# Patient Record
Sex: Female | Born: 1945
Health system: Southern US, Community
[De-identification: ages and names within clinical notes are randomized; demographics above are authoritative.]

## PROBLEM LIST (undated history)

## (undated) DIAGNOSIS — I4510 Unspecified right bundle-branch block: Secondary | ICD-10-CM

## (undated) DIAGNOSIS — J189 Pneumonia, unspecified organism: Secondary | ICD-10-CM

## (undated) DIAGNOSIS — M1990 Unspecified osteoarthritis, unspecified site: Secondary | ICD-10-CM

## (undated) DIAGNOSIS — M2041 Other hammer toe(s) (acquired), right foot: Secondary | ICD-10-CM

## (undated) DIAGNOSIS — E785 Hyperlipidemia, unspecified: Secondary | ICD-10-CM

## (undated) DIAGNOSIS — I700 Atherosclerosis of aorta: Secondary | ICD-10-CM

## (undated) DIAGNOSIS — I480 Paroxysmal atrial fibrillation: Secondary | ICD-10-CM

## (undated) DIAGNOSIS — I10 Essential (primary) hypertension: Secondary | ICD-10-CM

## (undated) DIAGNOSIS — Z860100 Personal history of colon polyps, unspecified: Secondary | ICD-10-CM

## (undated) DIAGNOSIS — E876 Hypokalemia: Secondary | ICD-10-CM

## (undated) DIAGNOSIS — R000 Tachycardia, unspecified: Secondary | ICD-10-CM

## (undated) DIAGNOSIS — I509 Heart failure, unspecified: Secondary | ICD-10-CM

## (undated) DIAGNOSIS — I5030 Unspecified diastolic (congestive) heart failure: Secondary | ICD-10-CM

## (undated) DIAGNOSIS — I4891 Unspecified atrial fibrillation: Secondary | ICD-10-CM

## (undated) DIAGNOSIS — L719 Rosacea, unspecified: Secondary | ICD-10-CM

## (undated) DIAGNOSIS — D329 Benign neoplasm of meninges, unspecified: Secondary | ICD-10-CM

## (undated) DIAGNOSIS — G459 Transient cerebral ischemic attack, unspecified: Secondary | ICD-10-CM

## (undated) DIAGNOSIS — K219 Gastro-esophageal reflux disease without esophagitis: Secondary | ICD-10-CM

## (undated) DIAGNOSIS — R011 Cardiac murmur, unspecified: Secondary | ICD-10-CM

## (undated) DIAGNOSIS — Z7901 Long term (current) use of anticoagulants: Secondary | ICD-10-CM

## (undated) DIAGNOSIS — Z79899 Other long term (current) drug therapy: Secondary | ICD-10-CM

## (undated) HISTORY — DX: Benign neoplasm of meninges, unspecified: D32.9

## (undated) HISTORY — DX: Transient cerebral ischemic attack, unspecified: G45.9

## (undated) HISTORY — PX: COLONOSCOPY: SHX5424

## (undated) HISTORY — PX: BLADDER SURGERY: SHX569

## (undated) HISTORY — PX: ABDOMINAL HYSTERECTOMY: SHX81

---

## 1998-02-21 ENCOUNTER — Inpatient Hospital Stay (HOSPITAL_COMMUNITY): Admission: RE | Admit: 1998-02-21 | Discharge: 1998-02-23 | Payer: Self-pay | Admitting: Obstetrics and Gynecology

## 1998-03-01 ENCOUNTER — Inpatient Hospital Stay (HOSPITAL_COMMUNITY): Admission: AD | Admit: 1998-03-01 | Discharge: 1998-03-01 | Payer: Self-pay | Admitting: Obstetrics and Gynecology

## 1998-03-22 ENCOUNTER — Inpatient Hospital Stay (HOSPITAL_COMMUNITY): Admission: AD | Admit: 1998-03-22 | Discharge: 1998-03-22 | Payer: Self-pay | Admitting: Gynecology

## 1998-05-31 ENCOUNTER — Other Ambulatory Visit: Admission: RE | Admit: 1998-05-31 | Discharge: 1998-05-31 | Payer: Self-pay | Admitting: Obstetrics and Gynecology

## 1998-08-24 ENCOUNTER — Ambulatory Visit (HOSPITAL_COMMUNITY): Admission: RE | Admit: 1998-08-24 | Discharge: 1998-08-24 | Payer: Self-pay | Admitting: Family Medicine

## 1999-06-05 ENCOUNTER — Other Ambulatory Visit: Admission: RE | Admit: 1999-06-05 | Discharge: 1999-06-05 | Payer: Self-pay | Admitting: Obstetrics and Gynecology

## 2000-09-17 ENCOUNTER — Other Ambulatory Visit: Admission: RE | Admit: 2000-09-17 | Discharge: 2000-09-17 | Payer: Self-pay | Admitting: Obstetrics and Gynecology

## 2002-12-09 ENCOUNTER — Other Ambulatory Visit: Admission: RE | Admit: 2002-12-09 | Discharge: 2002-12-09 | Payer: Self-pay | Admitting: Family Medicine

## 2003-12-05 ENCOUNTER — Other Ambulatory Visit: Admission: RE | Admit: 2003-12-05 | Discharge: 2003-12-05 | Payer: Self-pay | Admitting: Obstetrics and Gynecology

## 2005-01-01 ENCOUNTER — Other Ambulatory Visit: Admission: RE | Admit: 2005-01-01 | Discharge: 2005-01-01 | Payer: Self-pay | Admitting: Obstetrics and Gynecology

## 2006-02-17 ENCOUNTER — Other Ambulatory Visit: Admission: RE | Admit: 2006-02-17 | Discharge: 2006-02-17 | Payer: Self-pay | Admitting: Obstetrics and Gynecology

## 2007-02-24 ENCOUNTER — Other Ambulatory Visit: Admission: RE | Admit: 2007-02-24 | Discharge: 2007-02-24 | Payer: Self-pay | Admitting: Obstetrics and Gynecology

## 2011-08-13 DIAGNOSIS — L821 Other seborrheic keratosis: Secondary | ICD-10-CM | POA: Diagnosis not present

## 2011-08-13 DIAGNOSIS — L820 Inflamed seborrheic keratosis: Secondary | ICD-10-CM | POA: Diagnosis not present

## 2011-08-23 DIAGNOSIS — I10 Essential (primary) hypertension: Secondary | ICD-10-CM | POA: Diagnosis not present

## 2011-08-23 DIAGNOSIS — Z23 Encounter for immunization: Secondary | ICD-10-CM | POA: Diagnosis not present

## 2011-08-23 DIAGNOSIS — K219 Gastro-esophageal reflux disease without esophagitis: Secondary | ICD-10-CM | POA: Diagnosis not present

## 2011-08-23 DIAGNOSIS — E7800 Pure hypercholesterolemia, unspecified: Secondary | ICD-10-CM | POA: Diagnosis not present

## 2011-08-23 DIAGNOSIS — Z1331 Encounter for screening for depression: Secondary | ICD-10-CM | POA: Diagnosis not present

## 2011-08-29 DIAGNOSIS — I8310 Varicose veins of unspecified lower extremity with inflammation: Secondary | ICD-10-CM | POA: Diagnosis not present

## 2011-08-30 DIAGNOSIS — I8310 Varicose veins of unspecified lower extremity with inflammation: Secondary | ICD-10-CM | POA: Diagnosis not present

## 2011-09-24 DIAGNOSIS — I8310 Varicose veins of unspecified lower extremity with inflammation: Secondary | ICD-10-CM | POA: Diagnosis not present

## 2011-11-28 DIAGNOSIS — I8310 Varicose veins of unspecified lower extremity with inflammation: Secondary | ICD-10-CM | POA: Diagnosis not present

## 2011-12-05 DIAGNOSIS — Z1289 Encounter for screening for malignant neoplasm of other sites: Secondary | ICD-10-CM | POA: Diagnosis not present

## 2011-12-10 DIAGNOSIS — Z1231 Encounter for screening mammogram for malignant neoplasm of breast: Secondary | ICD-10-CM | POA: Diagnosis not present

## 2012-02-20 DIAGNOSIS — H01009 Unspecified blepharitis unspecified eye, unspecified eyelid: Secondary | ICD-10-CM | POA: Diagnosis not present

## 2012-02-20 DIAGNOSIS — I10 Essential (primary) hypertension: Secondary | ICD-10-CM | POA: Diagnosis not present

## 2012-02-20 DIAGNOSIS — K219 Gastro-esophageal reflux disease without esophagitis: Secondary | ICD-10-CM | POA: Diagnosis not present

## 2012-02-20 DIAGNOSIS — H2510 Age-related nuclear cataract, unspecified eye: Secondary | ICD-10-CM | POA: Diagnosis not present

## 2012-02-20 DIAGNOSIS — E7800 Pure hypercholesterolemia, unspecified: Secondary | ICD-10-CM | POA: Diagnosis not present

## 2012-02-20 DIAGNOSIS — E669 Obesity, unspecified: Secondary | ICD-10-CM | POA: Diagnosis not present

## 2012-02-20 DIAGNOSIS — H04129 Dry eye syndrome of unspecified lacrimal gland: Secondary | ICD-10-CM | POA: Diagnosis not present

## 2012-03-11 DIAGNOSIS — I8310 Varicose veins of unspecified lower extremity with inflammation: Secondary | ICD-10-CM | POA: Diagnosis not present

## 2012-03-13 DIAGNOSIS — I8310 Varicose veins of unspecified lower extremity with inflammation: Secondary | ICD-10-CM | POA: Diagnosis not present

## 2012-04-09 DIAGNOSIS — I8310 Varicose veins of unspecified lower extremity with inflammation: Secondary | ICD-10-CM | POA: Diagnosis not present

## 2012-04-09 DIAGNOSIS — I872 Venous insufficiency (chronic) (peripheral): Secondary | ICD-10-CM | POA: Diagnosis not present

## 2012-04-22 DIAGNOSIS — I8310 Varicose veins of unspecified lower extremity with inflammation: Secondary | ICD-10-CM | POA: Diagnosis not present

## 2012-04-22 DIAGNOSIS — I872 Venous insufficiency (chronic) (peripheral): Secondary | ICD-10-CM | POA: Diagnosis not present

## 2012-05-07 DIAGNOSIS — I8310 Varicose veins of unspecified lower extremity with inflammation: Secondary | ICD-10-CM | POA: Diagnosis not present

## 2012-05-07 DIAGNOSIS — M7981 Nontraumatic hematoma of soft tissue: Secondary | ICD-10-CM | POA: Diagnosis not present

## 2012-05-07 DIAGNOSIS — M79609 Pain in unspecified limb: Secondary | ICD-10-CM | POA: Diagnosis not present

## 2012-05-21 DIAGNOSIS — I8310 Varicose veins of unspecified lower extremity with inflammation: Secondary | ICD-10-CM | POA: Diagnosis not present

## 2012-05-21 DIAGNOSIS — M79609 Pain in unspecified limb: Secondary | ICD-10-CM | POA: Diagnosis not present

## 2012-06-17 DIAGNOSIS — M79609 Pain in unspecified limb: Secondary | ICD-10-CM | POA: Diagnosis not present

## 2012-06-27 DIAGNOSIS — Z23 Encounter for immunization: Secondary | ICD-10-CM | POA: Diagnosis not present

## 2012-07-03 DIAGNOSIS — M7981 Nontraumatic hematoma of soft tissue: Secondary | ICD-10-CM | POA: Diagnosis not present

## 2012-08-04 DIAGNOSIS — M7981 Nontraumatic hematoma of soft tissue: Secondary | ICD-10-CM | POA: Diagnosis not present

## 2012-08-26 DIAGNOSIS — L609 Nail disorder, unspecified: Secondary | ICD-10-CM | POA: Diagnosis not present

## 2012-08-26 DIAGNOSIS — E7800 Pure hypercholesterolemia, unspecified: Secondary | ICD-10-CM | POA: Diagnosis not present

## 2012-08-26 DIAGNOSIS — I10 Essential (primary) hypertension: Secondary | ICD-10-CM | POA: Diagnosis not present

## 2012-08-26 DIAGNOSIS — Z1331 Encounter for screening for depression: Secondary | ICD-10-CM | POA: Diagnosis not present

## 2012-08-26 DIAGNOSIS — K219 Gastro-esophageal reflux disease without esophagitis: Secondary | ICD-10-CM | POA: Diagnosis not present

## 2012-11-04 DIAGNOSIS — M216X9 Other acquired deformities of unspecified foot: Secondary | ICD-10-CM | POA: Diagnosis not present

## 2012-11-04 DIAGNOSIS — Q828 Other specified congenital malformations of skin: Secondary | ICD-10-CM | POA: Diagnosis not present

## 2012-12-25 DIAGNOSIS — R609 Edema, unspecified: Secondary | ICD-10-CM | POA: Diagnosis not present

## 2012-12-28 DIAGNOSIS — Z1231 Encounter for screening mammogram for malignant neoplasm of breast: Secondary | ICD-10-CM | POA: Diagnosis not present

## 2013-01-12 DIAGNOSIS — I8310 Varicose veins of unspecified lower extremity with inflammation: Secondary | ICD-10-CM | POA: Diagnosis not present

## 2013-01-21 DIAGNOSIS — I8310 Varicose veins of unspecified lower extremity with inflammation: Secondary | ICD-10-CM | POA: Diagnosis not present

## 2013-01-29 DIAGNOSIS — Z1289 Encounter for screening for malignant neoplasm of other sites: Secondary | ICD-10-CM | POA: Diagnosis not present

## 2013-02-17 DIAGNOSIS — R5381 Other malaise: Secondary | ICD-10-CM | POA: Diagnosis not present

## 2013-02-17 DIAGNOSIS — I10 Essential (primary) hypertension: Secondary | ICD-10-CM | POA: Diagnosis not present

## 2013-02-17 DIAGNOSIS — E669 Obesity, unspecified: Secondary | ICD-10-CM | POA: Diagnosis not present

## 2013-02-17 DIAGNOSIS — K219 Gastro-esophageal reflux disease without esophagitis: Secondary | ICD-10-CM | POA: Diagnosis not present

## 2013-02-17 DIAGNOSIS — E7800 Pure hypercholesterolemia, unspecified: Secondary | ICD-10-CM | POA: Diagnosis not present

## 2013-02-17 DIAGNOSIS — R5383 Other fatigue: Secondary | ICD-10-CM | POA: Diagnosis not present

## 2013-02-19 DIAGNOSIS — R300 Dysuria: Secondary | ICD-10-CM | POA: Diagnosis not present

## 2013-04-22 DIAGNOSIS — Z1211 Encounter for screening for malignant neoplasm of colon: Secondary | ICD-10-CM | POA: Diagnosis not present

## 2013-04-22 DIAGNOSIS — Z79899 Other long term (current) drug therapy: Secondary | ICD-10-CM | POA: Diagnosis not present

## 2013-04-22 DIAGNOSIS — Z23 Encounter for immunization: Secondary | ICD-10-CM | POA: Diagnosis not present

## 2013-04-22 DIAGNOSIS — E7800 Pure hypercholesterolemia, unspecified: Secondary | ICD-10-CM | POA: Diagnosis not present

## 2013-05-17 DIAGNOSIS — Z09 Encounter for follow-up examination after completed treatment for conditions other than malignant neoplasm: Secondary | ICD-10-CM | POA: Diagnosis not present

## 2013-05-17 DIAGNOSIS — Z86010 Personal history of colonic polyps: Secondary | ICD-10-CM | POA: Diagnosis not present

## 2013-05-17 DIAGNOSIS — K5730 Diverticulosis of large intestine without perforation or abscess without bleeding: Secondary | ICD-10-CM | POA: Diagnosis not present

## 2013-10-28 DIAGNOSIS — I10 Essential (primary) hypertension: Secondary | ICD-10-CM | POA: Diagnosis not present

## 2013-10-28 DIAGNOSIS — E7800 Pure hypercholesterolemia, unspecified: Secondary | ICD-10-CM | POA: Diagnosis not present

## 2014-02-09 DIAGNOSIS — Z1289 Encounter for screening for malignant neoplasm of other sites: Secondary | ICD-10-CM | POA: Diagnosis not present

## 2014-02-09 DIAGNOSIS — Z124 Encounter for screening for malignant neoplasm of cervix: Secondary | ICD-10-CM | POA: Diagnosis not present

## 2014-02-23 DIAGNOSIS — H43819 Vitreous degeneration, unspecified eye: Secondary | ICD-10-CM | POA: Diagnosis not present

## 2014-02-23 DIAGNOSIS — H1045 Other chronic allergic conjunctivitis: Secondary | ICD-10-CM | POA: Diagnosis not present

## 2014-02-23 DIAGNOSIS — H04129 Dry eye syndrome of unspecified lacrimal gland: Secondary | ICD-10-CM | POA: Diagnosis not present

## 2014-02-23 DIAGNOSIS — H43399 Other vitreous opacities, unspecified eye: Secondary | ICD-10-CM | POA: Diagnosis not present

## 2014-02-23 DIAGNOSIS — H02839 Dermatochalasis of unspecified eye, unspecified eyelid: Secondary | ICD-10-CM | POA: Diagnosis not present

## 2014-02-23 DIAGNOSIS — H2510 Age-related nuclear cataract, unspecified eye: Secondary | ICD-10-CM | POA: Diagnosis not present

## 2014-04-28 DIAGNOSIS — I10 Essential (primary) hypertension: Secondary | ICD-10-CM | POA: Diagnosis not present

## 2014-04-28 DIAGNOSIS — E780 Pure hypercholesterolemia: Secondary | ICD-10-CM | POA: Diagnosis not present

## 2014-04-28 DIAGNOSIS — E669 Obesity, unspecified: Secondary | ICD-10-CM | POA: Diagnosis not present

## 2014-04-28 DIAGNOSIS — Z1389 Encounter for screening for other disorder: Secondary | ICD-10-CM | POA: Diagnosis not present

## 2014-04-28 DIAGNOSIS — H9209 Otalgia, unspecified ear: Secondary | ICD-10-CM | POA: Diagnosis not present

## 2014-04-28 DIAGNOSIS — K219 Gastro-esophageal reflux disease without esophagitis: Secondary | ICD-10-CM | POA: Diagnosis not present

## 2014-05-17 DIAGNOSIS — Z23 Encounter for immunization: Secondary | ICD-10-CM | POA: Diagnosis not present

## 2014-07-18 DIAGNOSIS — Z1231 Encounter for screening mammogram for malignant neoplasm of breast: Secondary | ICD-10-CM | POA: Diagnosis not present

## 2014-10-26 DIAGNOSIS — E780 Pure hypercholesterolemia: Secondary | ICD-10-CM | POA: Diagnosis not present

## 2014-10-26 DIAGNOSIS — E669 Obesity, unspecified: Secondary | ICD-10-CM | POA: Diagnosis not present

## 2014-10-26 DIAGNOSIS — Z131 Encounter for screening for diabetes mellitus: Secondary | ICD-10-CM | POA: Diagnosis not present

## 2014-10-26 DIAGNOSIS — I10 Essential (primary) hypertension: Secondary | ICD-10-CM | POA: Diagnosis not present

## 2014-10-26 DIAGNOSIS — Z1389 Encounter for screening for other disorder: Secondary | ICD-10-CM | POA: Diagnosis not present

## 2014-10-26 DIAGNOSIS — K219 Gastro-esophageal reflux disease without esophagitis: Secondary | ICD-10-CM | POA: Diagnosis not present

## 2015-04-28 DIAGNOSIS — K219 Gastro-esophageal reflux disease without esophagitis: Secondary | ICD-10-CM | POA: Diagnosis not present

## 2015-04-28 DIAGNOSIS — I10 Essential (primary) hypertension: Secondary | ICD-10-CM | POA: Diagnosis not present

## 2015-04-28 DIAGNOSIS — E669 Obesity, unspecified: Secondary | ICD-10-CM | POA: Diagnosis not present

## 2015-04-28 DIAGNOSIS — E7800 Pure hypercholesterolemia, unspecified: Secondary | ICD-10-CM | POA: Diagnosis not present

## 2015-06-07 DIAGNOSIS — T1511XA Foreign body in conjunctival sac, right eye, initial encounter: Secondary | ICD-10-CM | POA: Diagnosis not present

## 2015-07-03 DIAGNOSIS — Z23 Encounter for immunization: Secondary | ICD-10-CM | POA: Diagnosis not present

## 2015-08-23 DIAGNOSIS — Z1231 Encounter for screening mammogram for malignant neoplasm of breast: Secondary | ICD-10-CM | POA: Diagnosis not present

## 2015-10-20 DIAGNOSIS — E7800 Pure hypercholesterolemia, unspecified: Secondary | ICD-10-CM | POA: Diagnosis not present

## 2015-10-20 DIAGNOSIS — K219 Gastro-esophageal reflux disease without esophagitis: Secondary | ICD-10-CM | POA: Diagnosis not present

## 2015-10-20 DIAGNOSIS — Z1389 Encounter for screening for other disorder: Secondary | ICD-10-CM | POA: Diagnosis not present

## 2015-10-20 DIAGNOSIS — Z6835 Body mass index (BMI) 35.0-35.9, adult: Secondary | ICD-10-CM | POA: Diagnosis not present

## 2015-10-20 DIAGNOSIS — I10 Essential (primary) hypertension: Secondary | ICD-10-CM | POA: Diagnosis not present

## 2015-10-20 DIAGNOSIS — E669 Obesity, unspecified: Secondary | ICD-10-CM | POA: Diagnosis not present

## 2016-01-11 DIAGNOSIS — J300 Vasomotor rhinitis: Secondary | ICD-10-CM | POA: Diagnosis not present

## 2016-01-11 DIAGNOSIS — R0600 Dyspnea, unspecified: Secondary | ICD-10-CM | POA: Diagnosis not present

## 2016-01-11 DIAGNOSIS — J310 Chronic rhinitis: Secondary | ICD-10-CM | POA: Diagnosis not present

## 2016-01-11 DIAGNOSIS — R05 Cough: Secondary | ICD-10-CM | POA: Diagnosis not present

## 2016-02-27 DIAGNOSIS — H2513 Age-related nuclear cataract, bilateral: Secondary | ICD-10-CM | POA: Diagnosis not present

## 2016-02-27 DIAGNOSIS — H02831 Dermatochalasis of right upper eyelid: Secondary | ICD-10-CM | POA: Diagnosis not present

## 2016-02-27 DIAGNOSIS — H02834 Dermatochalasis of left upper eyelid: Secondary | ICD-10-CM | POA: Diagnosis not present

## 2016-02-27 DIAGNOSIS — H04123 Dry eye syndrome of bilateral lacrimal glands: Secondary | ICD-10-CM | POA: Diagnosis not present

## 2016-03-27 DIAGNOSIS — I10 Essential (primary) hypertension: Secondary | ICD-10-CM | POA: Diagnosis not present

## 2016-03-27 DIAGNOSIS — E7800 Pure hypercholesterolemia, unspecified: Secondary | ICD-10-CM | POA: Diagnosis not present

## 2016-03-27 DIAGNOSIS — Z23 Encounter for immunization: Secondary | ICD-10-CM | POA: Diagnosis not present

## 2016-03-27 DIAGNOSIS — K219 Gastro-esophageal reflux disease without esophagitis: Secondary | ICD-10-CM | POA: Diagnosis not present

## 2016-03-27 DIAGNOSIS — E669 Obesity, unspecified: Secondary | ICD-10-CM | POA: Diagnosis not present

## 2016-03-27 DIAGNOSIS — M79676 Pain in unspecified toe(s): Secondary | ICD-10-CM | POA: Diagnosis not present

## 2016-04-30 DIAGNOSIS — R8271 Bacteriuria: Secondary | ICD-10-CM | POA: Diagnosis not present

## 2016-04-30 DIAGNOSIS — Z1289 Encounter for screening for malignant neoplasm of other sites: Secondary | ICD-10-CM | POA: Diagnosis not present

## 2016-05-10 DIAGNOSIS — M7751 Other enthesopathy of right foot: Secondary | ICD-10-CM | POA: Diagnosis not present

## 2016-05-10 DIAGNOSIS — M2011 Hallux valgus (acquired), right foot: Secondary | ICD-10-CM | POA: Diagnosis not present

## 2016-05-10 DIAGNOSIS — M79671 Pain in right foot: Secondary | ICD-10-CM | POA: Diagnosis not present

## 2016-05-10 DIAGNOSIS — G5761 Lesion of plantar nerve, right lower limb: Secondary | ICD-10-CM | POA: Diagnosis not present

## 2016-05-23 DIAGNOSIS — M7751 Other enthesopathy of right foot: Secondary | ICD-10-CM | POA: Diagnosis not present

## 2016-05-23 DIAGNOSIS — G5761 Lesion of plantar nerve, right lower limb: Secondary | ICD-10-CM | POA: Diagnosis not present

## 2016-07-22 DIAGNOSIS — R0600 Dyspnea, unspecified: Secondary | ICD-10-CM | POA: Diagnosis not present

## 2016-07-22 DIAGNOSIS — J310 Chronic rhinitis: Secondary | ICD-10-CM | POA: Diagnosis not present

## 2016-07-22 DIAGNOSIS — J300 Vasomotor rhinitis: Secondary | ICD-10-CM | POA: Diagnosis not present

## 2016-07-22 DIAGNOSIS — R05 Cough: Secondary | ICD-10-CM | POA: Diagnosis not present

## 2016-09-24 DIAGNOSIS — M1990 Unspecified osteoarthritis, unspecified site: Secondary | ICD-10-CM | POA: Diagnosis not present

## 2016-09-24 DIAGNOSIS — Z23 Encounter for immunization: Secondary | ICD-10-CM | POA: Diagnosis not present

## 2016-09-24 DIAGNOSIS — E7800 Pure hypercholesterolemia, unspecified: Secondary | ICD-10-CM | POA: Diagnosis not present

## 2016-09-24 DIAGNOSIS — K219 Gastro-esophageal reflux disease without esophagitis: Secondary | ICD-10-CM | POA: Diagnosis not present

## 2016-09-24 DIAGNOSIS — I10 Essential (primary) hypertension: Secondary | ICD-10-CM | POA: Diagnosis not present

## 2016-09-24 DIAGNOSIS — Z0000 Encounter for general adult medical examination without abnormal findings: Secondary | ICD-10-CM | POA: Diagnosis not present

## 2016-10-07 DIAGNOSIS — Z1231 Encounter for screening mammogram for malignant neoplasm of breast: Secondary | ICD-10-CM | POA: Diagnosis not present

## 2017-03-27 DIAGNOSIS — K219 Gastro-esophageal reflux disease without esophagitis: Secondary | ICD-10-CM | POA: Diagnosis not present

## 2017-03-27 DIAGNOSIS — E7800 Pure hypercholesterolemia, unspecified: Secondary | ICD-10-CM | POA: Diagnosis not present

## 2017-03-27 DIAGNOSIS — I10 Essential (primary) hypertension: Secondary | ICD-10-CM | POA: Diagnosis not present

## 2017-03-27 DIAGNOSIS — Z6837 Body mass index (BMI) 37.0-37.9, adult: Secondary | ICD-10-CM | POA: Diagnosis not present

## 2017-03-27 DIAGNOSIS — M1990 Unspecified osteoarthritis, unspecified site: Secondary | ICD-10-CM | POA: Diagnosis not present

## 2017-04-16 DIAGNOSIS — M2041 Other hammer toe(s) (acquired), right foot: Secondary | ICD-10-CM | POA: Diagnosis not present

## 2017-04-16 DIAGNOSIS — M7751 Other enthesopathy of right foot: Secondary | ICD-10-CM | POA: Diagnosis not present

## 2017-04-16 DIAGNOSIS — G5761 Lesion of plantar nerve, right lower limb: Secondary | ICD-10-CM | POA: Diagnosis not present

## 2017-05-01 DIAGNOSIS — G5761 Lesion of plantar nerve, right lower limb: Secondary | ICD-10-CM | POA: Diagnosis not present

## 2017-05-01 DIAGNOSIS — M7751 Other enthesopathy of right foot: Secondary | ICD-10-CM | POA: Diagnosis not present

## 2017-05-01 DIAGNOSIS — R319 Hematuria, unspecified: Secondary | ICD-10-CM | POA: Diagnosis not present

## 2017-05-01 DIAGNOSIS — N390 Urinary tract infection, site not specified: Secondary | ICD-10-CM | POA: Diagnosis not present

## 2017-05-01 DIAGNOSIS — R300 Dysuria: Secondary | ICD-10-CM | POA: Diagnosis not present

## 2017-06-03 DIAGNOSIS — Z23 Encounter for immunization: Secondary | ICD-10-CM | POA: Diagnosis not present

## 2017-07-22 DIAGNOSIS — J019 Acute sinusitis, unspecified: Secondary | ICD-10-CM | POA: Diagnosis not present

## 2017-07-22 DIAGNOSIS — R05 Cough: Secondary | ICD-10-CM | POA: Diagnosis not present

## 2017-07-22 DIAGNOSIS — J310 Chronic rhinitis: Secondary | ICD-10-CM | POA: Diagnosis not present

## 2017-07-22 DIAGNOSIS — J300 Vasomotor rhinitis: Secondary | ICD-10-CM | POA: Diagnosis not present

## 2017-08-20 DIAGNOSIS — R300 Dysuria: Secondary | ICD-10-CM | POA: Diagnosis not present

## 2017-09-26 DIAGNOSIS — I10 Essential (primary) hypertension: Secondary | ICD-10-CM | POA: Diagnosis not present

## 2017-09-26 DIAGNOSIS — E7800 Pure hypercholesterolemia, unspecified: Secondary | ICD-10-CM | POA: Diagnosis not present

## 2017-09-26 DIAGNOSIS — Z23 Encounter for immunization: Secondary | ICD-10-CM | POA: Diagnosis not present

## 2017-09-26 DIAGNOSIS — K219 Gastro-esophageal reflux disease without esophagitis: Secondary | ICD-10-CM | POA: Diagnosis not present

## 2017-09-26 DIAGNOSIS — Z0000 Encounter for general adult medical examination without abnormal findings: Secondary | ICD-10-CM | POA: Diagnosis not present

## 2017-11-06 DIAGNOSIS — R300 Dysuria: Secondary | ICD-10-CM | POA: Diagnosis not present

## 2017-11-06 DIAGNOSIS — N390 Urinary tract infection, site not specified: Secondary | ICD-10-CM | POA: Diagnosis not present

## 2017-12-10 DIAGNOSIS — Z1231 Encounter for screening mammogram for malignant neoplasm of breast: Secondary | ICD-10-CM | POA: Diagnosis not present

## 2018-01-14 DIAGNOSIS — M7751 Other enthesopathy of right foot: Secondary | ICD-10-CM | POA: Diagnosis not present

## 2018-01-14 DIAGNOSIS — G5761 Lesion of plantar nerve, right lower limb: Secondary | ICD-10-CM | POA: Diagnosis not present

## 2018-01-14 DIAGNOSIS — L97512 Non-pressure chronic ulcer of other part of right foot with fat layer exposed: Secondary | ICD-10-CM | POA: Diagnosis not present

## 2018-01-23 DIAGNOSIS — G5761 Lesion of plantar nerve, right lower limb: Secondary | ICD-10-CM | POA: Diagnosis not present

## 2018-03-31 DIAGNOSIS — K219 Gastro-esophageal reflux disease without esophagitis: Secondary | ICD-10-CM | POA: Diagnosis not present

## 2018-03-31 DIAGNOSIS — E7800 Pure hypercholesterolemia, unspecified: Secondary | ICD-10-CM | POA: Diagnosis not present

## 2018-03-31 DIAGNOSIS — I10 Essential (primary) hypertension: Secondary | ICD-10-CM | POA: Diagnosis not present

## 2018-03-31 DIAGNOSIS — Z6837 Body mass index (BMI) 37.0-37.9, adult: Secondary | ICD-10-CM | POA: Diagnosis not present

## 2018-04-30 DIAGNOSIS — J069 Acute upper respiratory infection, unspecified: Secondary | ICD-10-CM | POA: Diagnosis not present

## 2018-04-30 DIAGNOSIS — J0100 Acute maxillary sinusitis, unspecified: Secondary | ICD-10-CM | POA: Diagnosis not present

## 2018-05-16 DIAGNOSIS — R300 Dysuria: Secondary | ICD-10-CM | POA: Diagnosis not present

## 2018-05-22 DIAGNOSIS — Z23 Encounter for immunization: Secondary | ICD-10-CM | POA: Diagnosis not present

## 2018-07-02 DIAGNOSIS — G5761 Lesion of plantar nerve, right lower limb: Secondary | ICD-10-CM | POA: Diagnosis not present

## 2018-07-02 DIAGNOSIS — M7751 Other enthesopathy of right foot: Secondary | ICD-10-CM | POA: Diagnosis not present

## 2018-07-22 DIAGNOSIS — H1045 Other chronic allergic conjunctivitis: Secondary | ICD-10-CM | POA: Diagnosis not present

## 2018-07-22 DIAGNOSIS — J310 Chronic rhinitis: Secondary | ICD-10-CM | POA: Diagnosis not present

## 2018-07-22 DIAGNOSIS — J300 Vasomotor rhinitis: Secondary | ICD-10-CM | POA: Diagnosis not present

## 2018-07-22 DIAGNOSIS — R05 Cough: Secondary | ICD-10-CM | POA: Diagnosis not present

## 2018-07-24 DIAGNOSIS — G5761 Lesion of plantar nerve, right lower limb: Secondary | ICD-10-CM | POA: Diagnosis not present

## 2018-07-24 DIAGNOSIS — M7751 Other enthesopathy of right foot: Secondary | ICD-10-CM | POA: Diagnosis not present

## 2018-09-23 DIAGNOSIS — E7800 Pure hypercholesterolemia, unspecified: Secondary | ICD-10-CM | POA: Diagnosis not present

## 2018-09-23 DIAGNOSIS — I10 Essential (primary) hypertension: Secondary | ICD-10-CM | POA: Diagnosis not present

## 2018-09-23 DIAGNOSIS — K219 Gastro-esophageal reflux disease without esophagitis: Secondary | ICD-10-CM | POA: Diagnosis not present

## 2018-09-28 DIAGNOSIS — J310 Chronic rhinitis: Secondary | ICD-10-CM | POA: Diagnosis not present

## 2018-09-28 DIAGNOSIS — J300 Vasomotor rhinitis: Secondary | ICD-10-CM | POA: Diagnosis not present

## 2018-09-28 DIAGNOSIS — J019 Acute sinusitis, unspecified: Secondary | ICD-10-CM | POA: Diagnosis not present

## 2018-09-28 DIAGNOSIS — R05 Cough: Secondary | ICD-10-CM | POA: Diagnosis not present

## 2018-11-06 DIAGNOSIS — H0011 Chalazion right upper eyelid: Secondary | ICD-10-CM | POA: Diagnosis not present

## 2018-11-20 DIAGNOSIS — H0011 Chalazion right upper eyelid: Secondary | ICD-10-CM | POA: Diagnosis not present

## 2018-12-16 DIAGNOSIS — Z1231 Encounter for screening mammogram for malignant neoplasm of breast: Secondary | ICD-10-CM | POA: Diagnosis not present

## 2019-01-19 ENCOUNTER — Other Ambulatory Visit: Payer: Self-pay

## 2019-01-19 ENCOUNTER — Emergency Department (HOSPITAL_COMMUNITY)
Admission: EM | Admit: 2019-01-19 | Discharge: 2019-01-19 | Disposition: A | Discharge status: Home / Self Care | Payer: Medicare Other | Attending: Emergency Medicine | Admitting: Emergency Medicine

## 2019-01-19 ENCOUNTER — Encounter: Payer: Self-pay | Admitting: Emergency Medicine

## 2019-01-19 DIAGNOSIS — R58 Hemorrhage, not elsewhere classified: Secondary | ICD-10-CM | POA: Diagnosis not present

## 2019-01-19 DIAGNOSIS — Y92012 Bathroom of single-family (private) house as the place of occurrence of the external cause: Secondary | ICD-10-CM | POA: Insufficient documentation

## 2019-01-19 DIAGNOSIS — S81812A Laceration without foreign body, left lower leg, initial encounter: Secondary | ICD-10-CM | POA: Diagnosis not present

## 2019-01-19 DIAGNOSIS — W268XXA Contact with other sharp object(s), not elsewhere classified, initial encounter: Secondary | ICD-10-CM | POA: Diagnosis not present

## 2019-01-19 DIAGNOSIS — I10 Essential (primary) hypertension: Secondary | ICD-10-CM | POA: Insufficient documentation

## 2019-01-19 DIAGNOSIS — Y93E8 Activity, other personal hygiene: Secondary | ICD-10-CM | POA: Insufficient documentation

## 2019-01-19 DIAGNOSIS — Y999 Unspecified external cause status: Secondary | ICD-10-CM | POA: Diagnosis not present

## 2019-01-19 DIAGNOSIS — Z23 Encounter for immunization: Secondary | ICD-10-CM | POA: Insufficient documentation

## 2019-01-19 HISTORY — DX: Essential (primary) hypertension: I10

## 2019-01-19 HISTORY — DX: Tachycardia, unspecified: R00.0

## 2019-01-19 MED ORDER — TETANUS-DIPHTH-ACELL PERTUSSIS 5-2.5-18.5 LF-MCG/0.5 IM SUSP
0.5000 mL | Freq: Once | INTRAMUSCULAR | Status: AC
  Administered 2019-01-19: 0.5 mL via INTRAMUSCULAR
  Filled 2019-01-19: qty 0.5

## 2019-01-19 NOTE — Discharge Instructions (Signed)
Please follow up with your primary care provider within 5-7 days for re-evaluation of your symptoms. If you do not have a primary care provider, information for a healthcare clinic has been provided for you to make arrangements for follow up care. Please return to the emergency department for any new or worsening symptoms. ° °

## 2019-01-19 NOTE — ED Triage Notes (Signed)
Pt cut left leg while shaving. Pt has varicose veins.

## 2019-01-19 NOTE — ED Provider Notes (Signed)
[facility] DEPT Provider Note   CSN: [id number] Arrival date & time: 01/19/19  1309    History   Chief Complaint Chief Complaint  Patient presents with  . Laceration    HPI Dorothy Powers is a 73 y.o. female.     HPI   She is a 73 year old female with history of hypertension and tachycardia presents the emergency department today for evaluation of a wound to the left lower extremity.  Patient states she was shaving her legs prior to arrival when she cut her left lower leg.  She does have history of varicose veins.  States that the blood was spurting out.  It was not pulsating.  She cannot get the bleeding to stop so she contacted EMS who placed a wound dressing and applied pressure and advised her to come to the ED.  Denies any other injuries.  Is not sure when her last tetanus shot was.  Patient is not on anticoagulation.  Past Medical History:  Diagnosis Date  . Hypertension   . Tachycardia     There are no active problems to display for this patient.   Past Surgical History:  Procedure Laterality Date  . ABDOMINAL HYSTERECTOMY       OB History   No obstetric history on file.      Home Medications    Prior to Admission medications   Not on File    Family History No family history on file.  Social History Social History   Tobacco Use  . Smoking status: Not on file  Substance Use Topics  . Alcohol use: Not on file  . Drug use: Not on file     Allergies   Codeine and Sulfa antibiotics   Review of Systems Review of Systems  Constitutional: Negative for fever.  Skin: Positive for wound.     Physical Exam Updated Vital Signs BP (!) 157/73 (BP Location: Left Arm)   Pulse 68   Temp 98 F (36.7 C) (Oral)   Resp 16   Ht 5\' 3"  (1.6 m)   Wt 92.5 kg   SpO2 96%   BMI 36.14 kg/m   Physical Exam Vitals signs and nursing note reviewed.  Constitutional:      General: She is not in acute distress.  Appearance: She is well-developed.  HENT:     Head: Normocephalic and atraumatic.  Eyes:     Conjunctiva/sclera: Conjunctivae normal.  Neck:     Musculoskeletal: Neck supple.  Cardiovascular:     Rate and Rhythm: Normal rate.  Pulmonary:     Effort: Pulmonary effort is normal.  Musculoskeletal: Normal range of motion.     Comments: Punctate scab to the LLE that is nonbleeding. No TTP.   Skin:    General: Skin is warm and dry.  Neurological:     Mental Status: She is alert.      ED Treatments / Results  Labs (all labs ordered are listed, but only abnormal results are displayed) Labs Reviewed - No data to display  EKG None  Radiology No results found.  Procedures Procedures (including critical care time)  Medications Ordered in ED Medications  Tdap (BOOSTRIX) injection 0.5 mL (has no administration in time range)     Initial Impression / Assessment and Plan / ED Course  I have reviewed the triage vital signs and the nursing notes.  Pertinent labs & imaging results that were available during my care of the patient were reviewed by me and considered  in my medical decision making (see chart for details).     Final Clinical Impressions(s) / ED Diagnoses   Final diagnoses:  Laceration of left lower extremity, initial encounter   73 year old female presenting for evaluation of a wound of left lower extremity.  Cut her self while shaving.  Has history of varicose veins.  It does appear on exam that she might have nicked 1 of her varicose veins.  She is not bleeding on my evaluation.  The wound is very small and appears to have been superficial.  I advised her on wound care.  Updated her tetanus shot.  Advise follow-up with PCP and advised on return precautions.  Voiced understanding of plan and reasons return.  All questions answered.  Patient stable discharge.  ED Discharge Orders    None       Bishop Dublin 01/19/19 1605    Quintella Reichert, MD  01/20/19 1124

## 2019-01-25 DIAGNOSIS — R300 Dysuria: Secondary | ICD-10-CM | POA: Diagnosis not present

## 2019-01-25 DIAGNOSIS — N3001 Acute cystitis with hematuria: Secondary | ICD-10-CM | POA: Diagnosis not present

## 2019-03-04 DIAGNOSIS — H43811 Vitreous degeneration, right eye: Secondary | ICD-10-CM | POA: Diagnosis not present

## 2019-03-04 DIAGNOSIS — H02834 Dermatochalasis of left upper eyelid: Secondary | ICD-10-CM | POA: Diagnosis not present

## 2019-03-04 DIAGNOSIS — H2513 Age-related nuclear cataract, bilateral: Secondary | ICD-10-CM | POA: Diagnosis not present

## 2019-03-04 DIAGNOSIS — H02831 Dermatochalasis of right upper eyelid: Secondary | ICD-10-CM | POA: Diagnosis not present

## 2019-03-04 DIAGNOSIS — H04123 Dry eye syndrome of bilateral lacrimal glands: Secondary | ICD-10-CM | POA: Diagnosis not present

## 2019-03-26 DIAGNOSIS — I10 Essential (primary) hypertension: Secondary | ICD-10-CM | POA: Diagnosis not present

## 2019-03-26 DIAGNOSIS — Z23 Encounter for immunization: Secondary | ICD-10-CM | POA: Diagnosis not present

## 2019-03-26 DIAGNOSIS — K219 Gastro-esophageal reflux disease without esophagitis: Secondary | ICD-10-CM | POA: Diagnosis not present

## 2019-03-26 DIAGNOSIS — E7800 Pure hypercholesterolemia, unspecified: Secondary | ICD-10-CM | POA: Diagnosis not present

## 2019-03-26 DIAGNOSIS — R5383 Other fatigue: Secondary | ICD-10-CM | POA: Diagnosis not present

## 2019-04-12 DIAGNOSIS — L661 Lichen planopilaris: Secondary | ICD-10-CM | POA: Diagnosis not present

## 2019-04-12 DIAGNOSIS — L821 Other seborrheic keratosis: Secondary | ICD-10-CM | POA: Diagnosis not present

## 2019-04-12 DIAGNOSIS — L718 Other rosacea: Secondary | ICD-10-CM | POA: Diagnosis not present

## 2019-04-12 DIAGNOSIS — L820 Inflamed seborrheic keratosis: Secondary | ICD-10-CM | POA: Diagnosis not present

## 2019-04-12 DIAGNOSIS — L650 Telogen effluvium: Secondary | ICD-10-CM | POA: Diagnosis not present

## 2019-05-19 DIAGNOSIS — Z1272 Encounter for screening for malignant neoplasm of vagina: Secondary | ICD-10-CM | POA: Diagnosis not present

## 2019-07-16 DIAGNOSIS — G459 Transient cerebral ischemic attack, unspecified: Secondary | ICD-10-CM

## 2019-07-16 HISTORY — DX: Transient cerebral ischemic attack, unspecified: G45.9

## 2019-07-23 DIAGNOSIS — J300 Vasomotor rhinitis: Secondary | ICD-10-CM | POA: Diagnosis not present

## 2019-07-23 DIAGNOSIS — J310 Chronic rhinitis: Secondary | ICD-10-CM | POA: Diagnosis not present

## 2019-07-23 DIAGNOSIS — H1045 Other chronic allergic conjunctivitis: Secondary | ICD-10-CM | POA: Diagnosis not present

## 2019-07-23 DIAGNOSIS — R05 Cough: Secondary | ICD-10-CM | POA: Diagnosis not present

## 2019-08-03 ENCOUNTER — Ambulatory Visit: Payer: Medicare Other | Attending: Internal Medicine

## 2019-08-03 DIAGNOSIS — Z23 Encounter for immunization: Secondary | ICD-10-CM | POA: Insufficient documentation

## 2019-08-03 NOTE — Progress Notes (Signed)
   Covid-19 Vaccination Clinic  Name:  Dorothy Powers    MRN: [id number] DOB: [birth year]  08/03/2019  Ms. Halperin was observed post Covid-19 immunization for 15 minutes without incidence. She was provided with Vaccine Information Sheet and instruction to access the V-Safe system.   Ms. Steidinger was instructed to call 911 with any severe reactions post vaccine: Marland Kitchen Difficulty breathing  . Swelling of your face and throat  . A fast heartbeat  . A bad rash all over your body  . Dizziness and weakness    Immunizations Administered    Name Date Dose VIS Date Route   Pfizer COVID-19 Vaccine 08/03/2019  8:57 AM 0.3 mL 06/25/2019 Intramuscular   Manufacturer: [place]   Lot: [id number]   [place]: [id number]

## 2019-08-20 DIAGNOSIS — Z86010 Personal history of colonic polyps: Secondary | ICD-10-CM | POA: Diagnosis not present

## 2019-08-20 DIAGNOSIS — K921 Melena: Secondary | ICD-10-CM | POA: Diagnosis not present

## 2019-08-21 ENCOUNTER — Ambulatory Visit: Payer: Medicare Other | Attending: Internal Medicine

## 2019-08-21 DIAGNOSIS — Z23 Encounter for immunization: Secondary | ICD-10-CM | POA: Insufficient documentation

## 2019-08-21 NOTE — Progress Notes (Signed)
   Covid-19 Vaccination Clinic  Name:  Dorothy Powers    MRN: [id number] DOB: 06/25/1946  08/21/2019  Dorothy Powers was observed post Covid-19 immunization for 15 minutes without incidence. She was provided with Vaccine Information Sheet and instruction to access the V-Safe system.   Dorothy Powers was instructed to call 911 with any severe reactions post vaccine: Marland Kitchen Difficulty breathing  . Swelling of your face and throat  . A fast heartbeat  . A bad rash all over your body  . Dizziness and weakness    Immunizations Administered    Name Date Dose VIS Date Route   Pfizer COVID-19 Vaccine 08/21/2019  1:16 PM 0.3 mL 06/25/2019 Intramuscular   Manufacturer: [place]   Lot: [id number]   [place]: [id number]

## 2019-09-17 DIAGNOSIS — Z1159 Encounter for screening for other viral diseases: Secondary | ICD-10-CM | POA: Diagnosis not present

## 2019-09-22 DIAGNOSIS — K5730 Diverticulosis of large intestine without perforation or abscess without bleeding: Secondary | ICD-10-CM | POA: Diagnosis not present

## 2019-09-22 DIAGNOSIS — Z86010 Personal history of colonic polyps: Secondary | ICD-10-CM | POA: Diagnosis not present

## 2019-09-23 DIAGNOSIS — M1990 Unspecified osteoarthritis, unspecified site: Secondary | ICD-10-CM | POA: Diagnosis not present

## 2019-09-23 DIAGNOSIS — I10 Essential (primary) hypertension: Secondary | ICD-10-CM | POA: Diagnosis not present

## 2019-09-23 DIAGNOSIS — E7800 Pure hypercholesterolemia, unspecified: Secondary | ICD-10-CM | POA: Diagnosis not present

## 2019-09-23 DIAGNOSIS — K219 Gastro-esophageal reflux disease without esophagitis: Secondary | ICD-10-CM | POA: Diagnosis not present

## 2019-09-23 DIAGNOSIS — Z0000 Encounter for general adult medical examination without abnormal findings: Secondary | ICD-10-CM | POA: Diagnosis not present

## 2019-10-11 DIAGNOSIS — L650 Telogen effluvium: Secondary | ICD-10-CM | POA: Diagnosis not present

## 2019-10-11 DIAGNOSIS — L718 Other rosacea: Secondary | ICD-10-CM | POA: Diagnosis not present

## 2019-12-22 DIAGNOSIS — Z1231 Encounter for screening mammogram for malignant neoplasm of breast: Secondary | ICD-10-CM | POA: Diagnosis not present

## 2020-03-06 DIAGNOSIS — H02831 Dermatochalasis of right upper eyelid: Secondary | ICD-10-CM | POA: Diagnosis not present

## 2020-03-06 DIAGNOSIS — H52203 Unspecified astigmatism, bilateral: Secondary | ICD-10-CM | POA: Diagnosis not present

## 2020-03-06 DIAGNOSIS — H04123 Dry eye syndrome of bilateral lacrimal glands: Secondary | ICD-10-CM | POA: Diagnosis not present

## 2020-03-06 DIAGNOSIS — H43811 Vitreous degeneration, right eye: Secondary | ICD-10-CM | POA: Diagnosis not present

## 2020-03-06 DIAGNOSIS — H02834 Dermatochalasis of left upper eyelid: Secondary | ICD-10-CM | POA: Diagnosis not present

## 2020-03-06 DIAGNOSIS — H25813 Combined forms of age-related cataract, bilateral: Secondary | ICD-10-CM | POA: Diagnosis not present

## 2020-03-06 DIAGNOSIS — H5203 Hypermetropia, bilateral: Secondary | ICD-10-CM | POA: Diagnosis not present

## 2020-03-06 DIAGNOSIS — H524 Presbyopia: Secondary | ICD-10-CM | POA: Diagnosis not present

## 2020-03-27 DIAGNOSIS — E7800 Pure hypercholesterolemia, unspecified: Secondary | ICD-10-CM | POA: Diagnosis not present

## 2020-03-27 DIAGNOSIS — I10 Essential (primary) hypertension: Secondary | ICD-10-CM | POA: Diagnosis not present

## 2020-03-27 DIAGNOSIS — K219 Gastro-esophageal reflux disease without esophagitis: Secondary | ICD-10-CM | POA: Diagnosis not present

## 2020-03-27 DIAGNOSIS — M1990 Unspecified osteoarthritis, unspecified site: Secondary | ICD-10-CM | POA: Diagnosis not present

## 2020-04-19 DIAGNOSIS — Z23 Encounter for immunization: Secondary | ICD-10-CM | POA: Diagnosis not present

## 2020-05-05 DIAGNOSIS — L820 Inflamed seborrheic keratosis: Secondary | ICD-10-CM | POA: Diagnosis not present

## 2020-05-05 DIAGNOSIS — L718 Other rosacea: Secondary | ICD-10-CM | POA: Diagnosis not present

## 2020-06-23 DIAGNOSIS — Z23 Encounter for immunization: Secondary | ICD-10-CM | POA: Diagnosis not present

## 2020-07-21 DIAGNOSIS — H1045 Other chronic allergic conjunctivitis: Secondary | ICD-10-CM | POA: Diagnosis not present

## 2020-07-21 DIAGNOSIS — R040 Epistaxis: Secondary | ICD-10-CM | POA: Diagnosis not present

## 2020-07-21 DIAGNOSIS — J300 Vasomotor rhinitis: Secondary | ICD-10-CM | POA: Diagnosis not present

## 2020-07-21 DIAGNOSIS — J310 Chronic rhinitis: Secondary | ICD-10-CM | POA: Diagnosis not present

## 2020-08-02 DIAGNOSIS — H02834 Dermatochalasis of left upper eyelid: Secondary | ICD-10-CM | POA: Diagnosis not present

## 2020-08-02 DIAGNOSIS — H43811 Vitreous degeneration, right eye: Secondary | ICD-10-CM | POA: Diagnosis not present

## 2020-08-02 DIAGNOSIS — H25813 Combined forms of age-related cataract, bilateral: Secondary | ICD-10-CM | POA: Diagnosis not present

## 2020-08-02 DIAGNOSIS — H02831 Dermatochalasis of right upper eyelid: Secondary | ICD-10-CM | POA: Diagnosis not present

## 2020-08-02 DIAGNOSIS — H04123 Dry eye syndrome of bilateral lacrimal glands: Secondary | ICD-10-CM | POA: Diagnosis not present

## 2020-09-05 DIAGNOSIS — M21611 Bunion of right foot: Secondary | ICD-10-CM | POA: Diagnosis not present

## 2020-09-05 DIAGNOSIS — M12271 Villonodular synovitis (pigmented), right ankle and foot: Secondary | ICD-10-CM | POA: Diagnosis not present

## 2020-09-06 DIAGNOSIS — H25813 Combined forms of age-related cataract, bilateral: Secondary | ICD-10-CM | POA: Diagnosis not present

## 2020-09-06 DIAGNOSIS — H43811 Vitreous degeneration, right eye: Secondary | ICD-10-CM | POA: Diagnosis not present

## 2020-09-06 DIAGNOSIS — H04123 Dry eye syndrome of bilateral lacrimal glands: Secondary | ICD-10-CM | POA: Diagnosis not present

## 2020-09-06 DIAGNOSIS — H1045 Other chronic allergic conjunctivitis: Secondary | ICD-10-CM | POA: Diagnosis not present

## 2020-09-06 DIAGNOSIS — H02831 Dermatochalasis of right upper eyelid: Secondary | ICD-10-CM | POA: Diagnosis not present

## 2020-09-06 DIAGNOSIS — H02834 Dermatochalasis of left upper eyelid: Secondary | ICD-10-CM | POA: Diagnosis not present

## 2020-09-21 ENCOUNTER — Ambulatory Visit (INDEPENDENT_AMBULATORY_CARE_PROVIDER_SITE_OTHER): Payer: Medicare Other

## 2020-09-21 ENCOUNTER — Other Ambulatory Visit: Payer: Self-pay | Admitting: Podiatry

## 2020-09-21 ENCOUNTER — Encounter: Payer: Self-pay | Admitting: Podiatry

## 2020-09-21 ENCOUNTER — Other Ambulatory Visit: Payer: Self-pay

## 2020-09-21 ENCOUNTER — Ambulatory Visit (INDEPENDENT_AMBULATORY_CARE_PROVIDER_SITE_OTHER): Payer: Medicare Other | Admitting: Podiatry

## 2020-09-21 DIAGNOSIS — M2011 Hallux valgus (acquired), right foot: Secondary | ICD-10-CM

## 2020-09-21 DIAGNOSIS — M2010 Hallux valgus (acquired), unspecified foot: Secondary | ICD-10-CM

## 2020-09-21 DIAGNOSIS — M205X9 Other deformities of toe(s) (acquired), unspecified foot: Secondary | ICD-10-CM

## 2020-09-21 NOTE — Progress Notes (Signed)
She presents today complaining of about pain around the first metatarsophalangeal joint of the right foot states that she has been to see other doctors and they wanted to do these radical surgery she states that she would just like a simple bunionectomy if possible.  She also is concerned about the pain across the forefoot.  States has been going on for quite some time.  She states that the foot is starting to affect her ability perform her daily activities and enjoy life.  She is tried multiple injections all to no avail.  Objective: Vital signs are stable alert oriented x3 pulses are palpable.  Neurologic sensorium is intact.  Degenerative flexors are intact muscle strength is normal symmetrical bilateral.  Orthopedic evaluation restricts all joints distal to ankle full range of motion without crepitation with exception of the first metatarsophalangeal joint of the right foot and the lesser phalanges.  She does have a hallux abductovalgus deformity of the right foot with the first metatarsophalangeal joint being track bound and no hypermobility is noted.  She does have flexor contractures of the digits which are semirigid at about 145 degrees.  Cutaneous evaluation straight supple well-hydrated cutis there is no erythema edema cellulitis drainage or odor mild reactive hyperkeratotic lesion along the medial aspect of the hallux interphalangeal joint of the right foot.  Radiographs taken today demonstrate an osseously mature individual mild osteopenia is noted hallux abductovalgus deformity of the right foot is noted with increase in the first intermetatarsal angle greater than normal values hallux abductus angle is greater than normal values nearly dislocated.  She does have contracture of the second and third toes second is greater than the third.  Assessment: Hallux abductovalgus deformity and hammertoe deformities.  Plan: Discussed etiology pathology conservative surgical therapies at this point time she  would like to go ahead and consider surgical intervention.  We discussed the capital osteotomy such as Austin bunion repair with screw fixation we also discussed releasing of the second and third metatarsophalangeal joints or tenotomies.  She understands this and is amendable to it.  We did discuss the possible postop complications which may include but not limited to postop pain bleeding swelling infection recurrence need for further surgery overcorrection under correction also digit loss of limb loss of life.  Provided her with information regarding the surgery center at [facility] for postop recovery and information regarding anesthesia group.  We will follow-up with her at the time of surgery.  She is having cataract surgery prior to this.

## 2020-09-26 DIAGNOSIS — M1990 Unspecified osteoarthritis, unspecified site: Secondary | ICD-10-CM | POA: Diagnosis not present

## 2020-09-26 DIAGNOSIS — K219 Gastro-esophageal reflux disease without esophagitis: Secondary | ICD-10-CM | POA: Diagnosis not present

## 2020-09-26 DIAGNOSIS — Z0000 Encounter for general adult medical examination without abnormal findings: Secondary | ICD-10-CM | POA: Diagnosis not present

## 2020-09-26 DIAGNOSIS — E7800 Pure hypercholesterolemia, unspecified: Secondary | ICD-10-CM | POA: Diagnosis not present

## 2020-09-26 DIAGNOSIS — I10 Essential (primary) hypertension: Secondary | ICD-10-CM | POA: Diagnosis not present

## 2020-09-29 DIAGNOSIS — H25811 Combined forms of age-related cataract, right eye: Secondary | ICD-10-CM | POA: Diagnosis not present

## 2020-10-13 DIAGNOSIS — H25812 Combined forms of age-related cataract, left eye: Secondary | ICD-10-CM | POA: Diagnosis not present

## 2020-11-06 DIAGNOSIS — I10 Essential (primary) hypertension: Secondary | ICD-10-CM | POA: Diagnosis not present

## 2020-11-12 HISTORY — PX: BUNIONECTOMY: SHX129

## 2020-11-15 ENCOUNTER — Other Ambulatory Visit: Payer: Self-pay | Admitting: Podiatry

## 2020-11-15 MED ORDER — OXYCODONE-ACETAMINOPHEN 10-325 MG PO TABS: 1.0000 | ORAL_TABLET | Freq: Three times a day (TID) | ORAL | 0 refills | Status: AC | PRN

## 2020-11-15 MED ORDER — ONDANSETRON HCL 4 MG PO TABS: 4.0000 mg | ORAL_TABLET | Freq: Three times a day (TID) | ORAL | 0 refills | Status: DC | PRN

## 2020-11-15 MED ORDER — CEPHALEXIN 500 MG PO CAPS: 500.0000 mg | ORAL_CAPSULE | Freq: Two times a day (BID) | ORAL | 0 refills | Status: DC

## 2020-11-17 DIAGNOSIS — M25571 Pain in right ankle and joints of right foot: Secondary | ICD-10-CM | POA: Diagnosis not present

## 2020-11-17 DIAGNOSIS — M205X1 Other deformities of toe(s) (acquired), right foot: Secondary | ICD-10-CM | POA: Diagnosis not present

## 2020-11-17 DIAGNOSIS — M2011 Hallux valgus (acquired), right foot: Secondary | ICD-10-CM | POA: Diagnosis not present

## 2020-11-18 ENCOUNTER — Encounter: Payer: Self-pay | Admitting: Sports Medicine

## 2020-11-18 NOTE — Progress Notes (Signed)
Patient call Answering Service unsure if she was wearing the boot correctly. I advised patient to continue with wearing the boot may remove it periodically for icing. Patient admits that she has some pain and discomfort I also advised her to loosen up her ace wrap and instructed her on how to properly take her postoperative medications to assist with pain control. Advised patient that anytime she's attempting to ambulate she must replac e the boot to protect her surgical site. Patient expressed understanding. I advised her to call Answering Service back if there are any problems.

## 2020-11-20 ENCOUNTER — Telehealth: Payer: Self-pay | Admitting: *Deleted

## 2020-11-20 NOTE — Telephone Encounter (Signed)
Patient is having considerable pain in surgery foot,kept awake most of the night. She spoke with On call physician on Sat and given instructions to take Aleve alternating with the Oxycodone.stated that she took 660 mg and had a nose bleed(15-20) after taking , reduced to 440 every 8 hrs.she feels a little better today. Please advise.

## 2020-11-20 NOTE — Telephone Encounter (Signed)
Would you talk to her and see if there is something she can do ie ace wrap, boot loosened or off for a while, ice, elevation, etc.

## 2020-11-21 NOTE — Telephone Encounter (Signed)
Called pt -   She said she was still having pain, but was doing much better. I did review the options she could try if she felt the pain worsened again. She has a POV tomorrow

## 2020-11-22 ENCOUNTER — Ambulatory Visit (INDEPENDENT_AMBULATORY_CARE_PROVIDER_SITE_OTHER): Payer: Medicare Other | Admitting: Podiatry

## 2020-11-22 ENCOUNTER — Ambulatory Visit (INDEPENDENT_AMBULATORY_CARE_PROVIDER_SITE_OTHER): Payer: Medicare Other

## 2020-11-22 ENCOUNTER — Other Ambulatory Visit: Payer: Self-pay

## 2020-11-22 DIAGNOSIS — Z9889 Other specified postprocedural states: Secondary | ICD-10-CM

## 2020-11-22 DIAGNOSIS — M2011 Hallux valgus (acquired), right foot: Secondary | ICD-10-CM | POA: Diagnosis not present

## 2020-11-22 DIAGNOSIS — M205X9 Other deformities of toe(s) (acquired), unspecified foot: Secondary | ICD-10-CM

## 2020-11-22 DIAGNOSIS — M2010 Hallux valgus (acquired), unspecified foot: Secondary | ICD-10-CM

## 2020-11-23 ENCOUNTER — Encounter: Payer: Medicare Other | Admitting: Podiatry

## 2020-11-24 ENCOUNTER — Encounter: Payer: Self-pay | Admitting: Podiatry

## 2020-11-24 NOTE — Progress Notes (Signed)
Subjective:  Patient ID: Dorothy Powers, female    DOB: 05/20/1946,  MRN: [id number]  Chief Complaint  Patient presents with  . Routine Post Op    POST OP DOS 5.6.22    Procedure: Status post right bunionectomy with hammertoe correction of second and third digit  75 y.o. female returns for post-op check.  Patient states she is doing well.  She does not have any pain.  She is doing well on pain medication.  She is ambulating with a cam walker she denies any other acute complaints.  Review of Systems: Negative except as noted in the HPI. Denies N/V/F/Ch.  Past Medical History:  Diagnosis Date  . Hypertension   . Tachycardia     Current Outpatient Medications:  .  Ascorbic Acid (VITAMIN C PO), Take by mouth., Disp: , Rfl:  .  aspirin EC 81 MG tablet, Take 81 mg by mouth daily. Swallow whole., Disp: , Rfl:  .  atenolol (TENORMIN) 50 MG tablet, Take 50 mg by mouth 2 (two) times daily., Disp: , Rfl:  .  azelastine (ASTELIN) 0.1 % nasal spray, USE 1 SPRAY IN EACH NOSTRIL TWICE DAILY AS NEEDED NASALLY., Disp: , Rfl:  .  calcium carbonate (OSCAL) 1500 (600 Ca) MG TABS tablet, Take by mouth 2 (two) times daily with a meal., Disp: , Rfl:  .  cephALEXin (KEFLEX) 500 MG capsule, Take 1 capsule (500 mg total) by mouth 2 (two) times daily., Disp: 20 capsule, Rfl: 0 .  doxycycline (ADOXA) 50 MG tablet, Take 50 mg by mouth 2 (two) times daily., Disp: , Rfl:  .  ezetimibe (ZETIA) 10 MG tablet, Take 10 mg by mouth daily., Disp: , Rfl:  .  fluticasone (FLONASE) 50 MCG/ACT nasal spray, Place into both nostrils daily., Disp: , Rfl:  .  glucosamine-chondroitin 500-400 MG tablet, Take 1 tablet by mouth 3 (three) times daily., Disp: , Rfl:  .  lisinopril (ZESTRIL) 20 MG tablet, Take 20 mg by mouth daily., Disp: , Rfl:  .  loratadine (CLARITIN) 10 MG tablet, Take 10 mg by mouth daily., Disp: , Rfl:  .  Naproxen Sodium (ALEVE PO), Take by mouth., Disp: , Rfl:  .  Omega-3 Fatty Acids (FISH OIL PO), Take  by mouth., Disp: , Rfl:  .  ondansetron (ZOFRAN) 4 MG tablet, Take 1 tablet (4 mg total) by mouth every 8 (eight) hours as needed., Disp: 20 tablet, Rfl: 0 .  pantoprazole (PROTONIX) 40 MG tablet, Take 40 mg by mouth daily., Disp: , Rfl:   Social History   Tobacco Use  Smoking Status Never Smoker  Smokeless Tobacco Never Used    Allergies  Allergen Reactions  . Codeine   . Sulfa Antibiotics    Objective:  There were no vitals filed for this visit. There is no height or weight on file to calculate BMI. Constitutional Well developed. Well nourished.  Vascular Foot warm and well perfused. Capillary refill normal to all digits.   Neurologic Normal speech. Oriented to person, place, and time. Epicritic sensation to light touch grossly present bilaterally.  Dermatologic Skin healing well without signs of infection. Skin edges well coapted without signs of infection.  Orthopedic: Tenderness to palpation noted about the surgical site.   Radiographs: 3 views of skeletally mature adult right foot: Good correction alignment noted.  Reduction of the bunion deformity noted.  Sesamoid position are in normal alignment Assessment:   1. Acquired hallux valgus, unspecified laterality   2. Contracture of toe joint  3. Status post foot surgery    Plan:  Patient was evaluated and treated and all questions answered.  S/p foot surgery right -Progressing as expected post-operatively. -XR: See above -WB Status: Weightbearing as tolerated in cam boot -Sutures: Intact.  No clinical signs of dehiscence noted no complication noted -Medications: None -Foot redressed.  No follow-ups on file.

## 2020-11-30 ENCOUNTER — Ambulatory Visit (INDEPENDENT_AMBULATORY_CARE_PROVIDER_SITE_OTHER): Payer: Medicare Other | Admitting: Podiatry

## 2020-11-30 ENCOUNTER — Other Ambulatory Visit: Payer: Self-pay

## 2020-11-30 ENCOUNTER — Encounter: Payer: Self-pay | Admitting: Podiatry

## 2020-11-30 DIAGNOSIS — M205X9 Other deformities of toe(s) (acquired), unspecified foot: Secondary | ICD-10-CM

## 2020-11-30 DIAGNOSIS — M2010 Hallux valgus (acquired), unspecified foot: Secondary | ICD-10-CM

## 2020-11-30 DIAGNOSIS — Z9889 Other specified postprocedural states: Secondary | ICD-10-CM

## 2020-11-30 DIAGNOSIS — M2011 Hallux valgus (acquired), right foot: Secondary | ICD-10-CM | POA: Diagnosis not present

## 2020-12-03 NOTE — Progress Notes (Signed)
She presents today for second postop visit date of surgery 11/17/2020 [facility] bunionectomy with screw fixation extensor tenotomy second and third digits of the right foot.  States that seems to be doing okay barely any pain whatsoever.  Denies fever chills nausea vomiting muscle aches pains calf pain back pain chest pain shortness of breath.  Objective: Sutures are intact margins well coapted went ahead and remove the suture ends today.  She has good range of motion actively and passively first metatarsophalangeal joint.  Reviewed radiographs from the first postop visit they appear to be in good position.  Assessment: Well-healing surgical foot.  Plan: I redressed today with compression anklet and put her in a [place] shoe and allow her to start getting this wet I will follow-up with her in a couple weeks.  Other set of x-rays will be necessary at that time

## 2020-12-14 ENCOUNTER — Encounter: Payer: Self-pay | Admitting: Podiatry

## 2020-12-14 ENCOUNTER — Other Ambulatory Visit: Payer: Self-pay

## 2020-12-14 ENCOUNTER — Ambulatory Visit (INDEPENDENT_AMBULATORY_CARE_PROVIDER_SITE_OTHER): Payer: Medicare Other

## 2020-12-14 ENCOUNTER — Ambulatory Visit (INDEPENDENT_AMBULATORY_CARE_PROVIDER_SITE_OTHER): Payer: Medicare Other | Admitting: Podiatry

## 2020-12-14 DIAGNOSIS — M205X9 Other deformities of toe(s) (acquired), unspecified foot: Secondary | ICD-10-CM

## 2020-12-14 DIAGNOSIS — M2011 Hallux valgus (acquired), right foot: Secondary | ICD-10-CM | POA: Diagnosis not present

## 2020-12-14 DIAGNOSIS — E7800 Pure hypercholesterolemia, unspecified: Secondary | ICD-10-CM | POA: Insufficient documentation

## 2020-12-14 DIAGNOSIS — Z860100 Personal history of colon polyps, unspecified: Secondary | ICD-10-CM | POA: Insufficient documentation

## 2020-12-14 DIAGNOSIS — I10 Essential (primary) hypertension: Secondary | ICD-10-CM | POA: Insufficient documentation

## 2020-12-14 DIAGNOSIS — K921 Melena: Secondary | ICD-10-CM | POA: Insufficient documentation

## 2020-12-14 DIAGNOSIS — K219 Gastro-esophageal reflux disease without esophagitis: Secondary | ICD-10-CM | POA: Insufficient documentation

## 2020-12-14 DIAGNOSIS — M1990 Unspecified osteoarthritis, unspecified site: Secondary | ICD-10-CM | POA: Insufficient documentation

## 2020-12-14 DIAGNOSIS — Z9889 Other specified postprocedural states: Secondary | ICD-10-CM

## 2020-12-16 NOTE — Progress Notes (Signed)
She presents today for her postop visit she is nearly a month out at this point.  States that her big toe has been feeling all kinds of ways that she refers to an Liane Comber bunionectomy date of surgery 11/17/2020 with extensor tenotomy's to the second and third toes.  Objective: Vital signs are stable she is alert and oriented x3.  Pulses are palpable.  She has mild edema no erythema cellulitis drainage or odor.  She has good range of motion.  Assessment: Well-healing surgical foot.  Plan: Encourage range of motion today unguinal follow-up with her in a couple of weeks for reevaluation and another set of x-rays.

## 2020-12-27 DIAGNOSIS — Z1231 Encounter for screening mammogram for malignant neoplasm of breast: Secondary | ICD-10-CM | POA: Diagnosis not present

## 2020-12-28 ENCOUNTER — Other Ambulatory Visit: Payer: Self-pay

## 2020-12-28 ENCOUNTER — Encounter: Payer: Self-pay | Admitting: Podiatry

## 2020-12-28 ENCOUNTER — Ambulatory Visit (INDEPENDENT_AMBULATORY_CARE_PROVIDER_SITE_OTHER): Payer: Medicare Other | Admitting: Podiatry

## 2020-12-28 ENCOUNTER — Ambulatory Visit (INDEPENDENT_AMBULATORY_CARE_PROVIDER_SITE_OTHER): Payer: Medicare Other

## 2020-12-28 DIAGNOSIS — M205X9 Other deformities of toe(s) (acquired), unspecified foot: Secondary | ICD-10-CM

## 2020-12-28 DIAGNOSIS — M2011 Hallux valgus (acquired), right foot: Secondary | ICD-10-CM

## 2020-12-28 DIAGNOSIS — Z9889 Other specified postprocedural states: Secondary | ICD-10-CM

## 2020-12-30 NOTE — Progress Notes (Signed)
She presents today date of surgery 11/17/2020 [facility] bunionectomy with screw extensor and flexor tenotomy second and third toes states that I feel like the toes are starting to sort of turned back like it was.  Denies fever chills nausea vomiting muscle aches pains denies any trauma to the foot.  Objective: Vital signs are stable is alert and oriented x3 considerable edema to the right foot.  No erythema cellulitis drainage or odor she does have valgus rotation of the toe which is only slightly worsened from her first postoperative radiograph.  The capital osteotomy is intact and internal fixation is intact per radiograph.  She has good range of motion of the toe.  Assessment: Hallux valgus deformity slight regression.  Plan: Encourage range of motion exercises and I like to follow-up with her in about 5 weeks.

## 2021-01-05 DIAGNOSIS — I10 Essential (primary) hypertension: Secondary | ICD-10-CM | POA: Diagnosis not present

## 2021-01-18 ENCOUNTER — Ambulatory Visit (INDEPENDENT_AMBULATORY_CARE_PROVIDER_SITE_OTHER): Payer: Medicare Other

## 2021-01-18 ENCOUNTER — Other Ambulatory Visit: Payer: Self-pay

## 2021-01-18 ENCOUNTER — Ambulatory Visit (INDEPENDENT_AMBULATORY_CARE_PROVIDER_SITE_OTHER): Payer: Medicare Other | Admitting: Podiatry

## 2021-01-18 ENCOUNTER — Encounter: Payer: Self-pay | Admitting: Podiatry

## 2021-01-18 DIAGNOSIS — M2011 Hallux valgus (acquired), right foot: Secondary | ICD-10-CM

## 2021-01-18 DIAGNOSIS — M205X9 Other deformities of toe(s) (acquired), unspecified foot: Secondary | ICD-10-CM

## 2021-01-18 DIAGNOSIS — Z9889 Other specified postprocedural states: Secondary | ICD-10-CM

## 2021-01-19 NOTE — Progress Notes (Signed)
She presents today for follow-up of her Austin bunionectomy with screw fixation and extensor tenotomy's to toes #2 and #3 all on the right foot.  She states that is really doing pretty good still quite numb and tingly cramping in the toes a lot she said the big toe really feels kind of numb.  Objective: Vital signs are stable alert and oriented x3.  Pulses are palpable.  Moderate edema right there really much less than what I expected at this point since she is 2 months out.  She has good range of motion there is still some numbness but sensation is intact to the first metatarsophalangeal joint and the in her digital space of the deep peroneal distribution.  Assessment of the radiographs taken today demonstrate internal fixation is in good position in the capital osteotomy is healing very nicely.  Assessment: Well-healing surgical foot.  Plan: Discussed etiology pathology and surgical therapies at this point I encourage range of motion exercises encouraged her to get back into some regular shoe gear making sure to tell her not to squat down or to raise up on her forefoot.  She understands this and is amendable to it I am going to allow some light exercise and I would like to follow-up with her in about 1 month I also explained to her that most likely as the swelling goes down and the flexibility improves most likely her numbness and tingling will start to diminish as well.

## 2021-01-25 DIAGNOSIS — H02831 Dermatochalasis of right upper eyelid: Secondary | ICD-10-CM | POA: Diagnosis not present

## 2021-01-25 DIAGNOSIS — H53483 Generalized contraction of visual field, bilateral: Secondary | ICD-10-CM | POA: Diagnosis not present

## 2021-01-25 DIAGNOSIS — H02834 Dermatochalasis of left upper eyelid: Secondary | ICD-10-CM | POA: Diagnosis not present

## 2021-01-25 DIAGNOSIS — H04123 Dry eye syndrome of bilateral lacrimal glands: Secondary | ICD-10-CM | POA: Diagnosis not present

## 2021-01-25 DIAGNOSIS — E785 Hyperlipidemia, unspecified: Secondary | ICD-10-CM | POA: Insufficient documentation

## 2021-01-25 DIAGNOSIS — R000 Tachycardia, unspecified: Secondary | ICD-10-CM | POA: Insufficient documentation

## 2021-01-25 DIAGNOSIS — H57813 Brow ptosis, bilateral: Secondary | ICD-10-CM | POA: Diagnosis not present

## 2021-01-25 DIAGNOSIS — H02423 Myogenic ptosis of bilateral eyelids: Secondary | ICD-10-CM | POA: Diagnosis not present

## 2021-01-25 DIAGNOSIS — H02413 Mechanical ptosis of bilateral eyelids: Secondary | ICD-10-CM | POA: Diagnosis not present

## 2021-01-25 DIAGNOSIS — H0279 Other degenerative disorders of eyelid and periocular area: Secondary | ICD-10-CM | POA: Diagnosis not present

## 2021-01-29 DIAGNOSIS — M21611 Bunion of right foot: Secondary | ICD-10-CM | POA: Diagnosis not present

## 2021-01-29 DIAGNOSIS — R531 Weakness: Secondary | ICD-10-CM | POA: Diagnosis not present

## 2021-01-29 DIAGNOSIS — M25571 Pain in right ankle and joints of right foot: Secondary | ICD-10-CM | POA: Diagnosis not present

## 2021-01-29 DIAGNOSIS — M25674 Stiffness of right foot, not elsewhere classified: Secondary | ICD-10-CM | POA: Diagnosis not present

## 2021-01-29 DIAGNOSIS — R262 Difficulty in walking, not elsewhere classified: Secondary | ICD-10-CM | POA: Diagnosis not present

## 2021-01-31 DIAGNOSIS — M21611 Bunion of right foot: Secondary | ICD-10-CM | POA: Diagnosis not present

## 2021-01-31 DIAGNOSIS — M25571 Pain in right ankle and joints of right foot: Secondary | ICD-10-CM | POA: Diagnosis not present

## 2021-01-31 DIAGNOSIS — R531 Weakness: Secondary | ICD-10-CM | POA: Diagnosis not present

## 2021-01-31 DIAGNOSIS — R262 Difficulty in walking, not elsewhere classified: Secondary | ICD-10-CM | POA: Diagnosis not present

## 2021-01-31 DIAGNOSIS — M25674 Stiffness of right foot, not elsewhere classified: Secondary | ICD-10-CM | POA: Diagnosis not present

## 2021-02-02 DIAGNOSIS — R531 Weakness: Secondary | ICD-10-CM | POA: Diagnosis not present

## 2021-02-02 DIAGNOSIS — M25571 Pain in right ankle and joints of right foot: Secondary | ICD-10-CM | POA: Diagnosis not present

## 2021-02-02 DIAGNOSIS — M21611 Bunion of right foot: Secondary | ICD-10-CM | POA: Diagnosis not present

## 2021-02-02 DIAGNOSIS — R262 Difficulty in walking, not elsewhere classified: Secondary | ICD-10-CM | POA: Diagnosis not present

## 2021-02-02 DIAGNOSIS — M25674 Stiffness of right foot, not elsewhere classified: Secondary | ICD-10-CM | POA: Diagnosis not present

## 2021-02-05 DIAGNOSIS — M25674 Stiffness of right foot, not elsewhere classified: Secondary | ICD-10-CM | POA: Diagnosis not present

## 2021-02-05 DIAGNOSIS — R531 Weakness: Secondary | ICD-10-CM | POA: Diagnosis not present

## 2021-02-05 DIAGNOSIS — M21611 Bunion of right foot: Secondary | ICD-10-CM | POA: Diagnosis not present

## 2021-02-05 DIAGNOSIS — M25571 Pain in right ankle and joints of right foot: Secondary | ICD-10-CM | POA: Diagnosis not present

## 2021-02-05 DIAGNOSIS — R262 Difficulty in walking, not elsewhere classified: Secondary | ICD-10-CM | POA: Diagnosis not present

## 2021-02-06 DIAGNOSIS — M21611 Bunion of right foot: Secondary | ICD-10-CM | POA: Diagnosis not present

## 2021-02-06 DIAGNOSIS — R531 Weakness: Secondary | ICD-10-CM | POA: Diagnosis not present

## 2021-02-06 DIAGNOSIS — M25674 Stiffness of right foot, not elsewhere classified: Secondary | ICD-10-CM | POA: Diagnosis not present

## 2021-02-06 DIAGNOSIS — M25571 Pain in right ankle and joints of right foot: Secondary | ICD-10-CM | POA: Diagnosis not present

## 2021-02-06 DIAGNOSIS — R262 Difficulty in walking, not elsewhere classified: Secondary | ICD-10-CM | POA: Diagnosis not present

## 2021-02-09 DIAGNOSIS — R531 Weakness: Secondary | ICD-10-CM | POA: Diagnosis not present

## 2021-02-09 DIAGNOSIS — M21611 Bunion of right foot: Secondary | ICD-10-CM | POA: Diagnosis not present

## 2021-02-09 DIAGNOSIS — M25674 Stiffness of right foot, not elsewhere classified: Secondary | ICD-10-CM | POA: Diagnosis not present

## 2021-02-09 DIAGNOSIS — M25571 Pain in right ankle and joints of right foot: Secondary | ICD-10-CM | POA: Diagnosis not present

## 2021-02-09 DIAGNOSIS — R262 Difficulty in walking, not elsewhere classified: Secondary | ICD-10-CM | POA: Diagnosis not present

## 2021-02-12 DIAGNOSIS — H53483 Generalized contraction of visual field, bilateral: Secondary | ICD-10-CM | POA: Diagnosis not present

## 2021-02-13 DIAGNOSIS — M21611 Bunion of right foot: Secondary | ICD-10-CM | POA: Diagnosis not present

## 2021-02-13 DIAGNOSIS — M25674 Stiffness of right foot, not elsewhere classified: Secondary | ICD-10-CM | POA: Diagnosis not present

## 2021-02-13 DIAGNOSIS — M25571 Pain in right ankle and joints of right foot: Secondary | ICD-10-CM | POA: Diagnosis not present

## 2021-02-13 DIAGNOSIS — R262 Difficulty in walking, not elsewhere classified: Secondary | ICD-10-CM | POA: Diagnosis not present

## 2021-02-13 DIAGNOSIS — R531 Weakness: Secondary | ICD-10-CM | POA: Diagnosis not present

## 2021-02-14 ENCOUNTER — Observation Stay (HOSPITAL_COMMUNITY)
Admission: EM | Admit: 2021-02-14 | Discharge: 2021-02-15 | Disposition: A | Discharge status: Home / Self Care | Payer: Medicare Other | Attending: Internal Medicine | Admitting: Internal Medicine

## 2021-02-14 ENCOUNTER — Emergency Department (HOSPITAL_COMMUNITY): Payer: Medicare Other

## 2021-02-14 ENCOUNTER — Observation Stay (HOSPITAL_COMMUNITY): Payer: Medicare Other

## 2021-02-14 ENCOUNTER — Observation Stay (HOSPITAL_COMMUNITY)
Admit: 2021-02-14 | Discharge: 2021-02-14 | Disposition: A | Discharge status: Home / Self Care | Payer: Medicare Other | Attending: Internal Medicine | Admitting: Internal Medicine

## 2021-02-14 ENCOUNTER — Other Ambulatory Visit: Payer: Self-pay

## 2021-02-14 ENCOUNTER — Encounter (HOSPITAL_COMMUNITY): Payer: Self-pay | Admitting: Emergency Medicine

## 2021-02-14 ENCOUNTER — Observation Stay (HOSPITAL_COMMUNITY): Payer: BLUE CROSS/BLUE SHIELD

## 2021-02-14 DIAGNOSIS — Y900 Blood alcohol level of less than 20 mg/100 ml: Secondary | ICD-10-CM | POA: Insufficient documentation

## 2021-02-14 DIAGNOSIS — Z20822 Contact with and (suspected) exposure to covid-19: Secondary | ICD-10-CM | POA: Diagnosis not present

## 2021-02-14 DIAGNOSIS — D329 Benign neoplasm of meninges, unspecified: Secondary | ICD-10-CM | POA: Diagnosis not present

## 2021-02-14 DIAGNOSIS — R739 Hyperglycemia, unspecified: Secondary | ICD-10-CM

## 2021-02-14 DIAGNOSIS — G319 Degenerative disease of nervous system, unspecified: Secondary | ICD-10-CM | POA: Diagnosis not present

## 2021-02-14 DIAGNOSIS — R42 Dizziness and giddiness: Secondary | ICD-10-CM | POA: Diagnosis not present

## 2021-02-14 DIAGNOSIS — G459 Transient cerebral ischemic attack, unspecified: Secondary | ICD-10-CM | POA: Diagnosis not present

## 2021-02-14 DIAGNOSIS — G9389 Other specified disorders of brain: Secondary | ICD-10-CM

## 2021-02-14 DIAGNOSIS — E876 Hypokalemia: Secondary | ICD-10-CM | POA: Diagnosis not present

## 2021-02-14 DIAGNOSIS — E785 Hyperlipidemia, unspecified: Secondary | ICD-10-CM | POA: Insufficient documentation

## 2021-02-14 DIAGNOSIS — Z79899 Other long term (current) drug therapy: Secondary | ICD-10-CM | POA: Insufficient documentation

## 2021-02-14 DIAGNOSIS — I6521 Occlusion and stenosis of right carotid artery: Secondary | ICD-10-CM | POA: Diagnosis not present

## 2021-02-14 DIAGNOSIS — I10 Essential (primary) hypertension: Secondary | ICD-10-CM | POA: Diagnosis not present

## 2021-02-14 DIAGNOSIS — Z7982 Long term (current) use of aspirin: Secondary | ICD-10-CM | POA: Diagnosis not present

## 2021-02-14 DIAGNOSIS — R200 Anesthesia of skin: Secondary | ICD-10-CM | POA: Diagnosis not present

## 2021-02-14 DIAGNOSIS — I6502 Occlusion and stenosis of left vertebral artery: Secondary | ICD-10-CM | POA: Diagnosis not present

## 2021-02-14 LAB — I-STAT CHEM 8, ED
BUN: 16 mg/dL (ref 8–23)
Calcium, Ion: 1.24 mmol/L (ref 1.15–1.40)
Chloride: 102 mmol/L (ref 98–111)
Creatinine, Ser: 0.6 mg/dL (ref 0.44–1.00)
Glucose, Bld: 127 mg/dL — ABNORMAL HIGH (ref 70–99)
HCT: 43 % (ref 36.0–46.0)
Hemoglobin: 14.6 g/dL (ref 12.0–15.0)
Potassium: 3.6 mmol/L (ref 3.5–5.1)
Sodium: 140 mmol/L (ref 135–145)
TCO2: 32 mmol/L (ref 22–32)

## 2021-02-14 LAB — COMPREHENSIVE METABOLIC PANEL
ALT: 20 U/L (ref 0–44)
ALT: 18 U/L (ref 0–44)
AST: 22 U/L (ref 15–41)
AST: 19 U/L (ref 15–41)
Albumin: 4.2 g/dL (ref 3.5–5.0)
Albumin: 3.7 g/dL (ref 3.5–5.0)
Alkaline Phosphatase: 116 U/L (ref 38–126)
Alkaline Phosphatase: 94 U/L (ref 38–126)
Anion gap: 6 (ref 5–15)
Anion gap: 9 (ref 5–15)
BUN: 16 mg/dL (ref 8–23)
BUN: 15 mg/dL (ref 8–23)
CO2: 29 mmol/L (ref 22–32)
CO2: 27 mmol/L (ref 22–32)
Calcium: 9.1 mg/dL (ref 8.9–10.3)
Calcium: 9.1 mg/dL (ref 8.9–10.3)
Chloride: 102 mmol/L (ref 98–111)
Chloride: 104 mmol/L (ref 98–111)
Creatinine, Ser: 0.56 mg/dL (ref 0.44–1.00)
Creatinine, Ser: 0.55 mg/dL (ref 0.44–1.00)
GFR, Estimated: >60 mL/min (ref >60)
GFR, Estimated: >60 mL/min (ref >60)
Glucose, Bld: 125 mg/dL — ABNORMAL HIGH (ref 70–99)
Glucose, Bld: 111 mg/dL — ABNORMAL HIGH (ref 70–99)
Potassium: 3.4 mmol/L — ABNORMAL LOW (ref 3.5–5.1)
Potassium: 4 mmol/L (ref 3.5–5.1)
Sodium: 137 mmol/L (ref 135–145)
Sodium: 140 mmol/L (ref 135–145)
Total Bilirubin: 0.5 mg/dL (ref 0.3–1.2)
Total Bilirubin: 0.6 mg/dL (ref 0.3–1.2)
Total Protein: 7.9 g/dL (ref 6.5–8.1)
Total Protein: 6.8 g/dL (ref 6.5–8.1)

## 2021-02-14 LAB — CBC
HCT: 43.5 % (ref 36.0–46.0)
HCT: 40.5 % (ref 36.0–46.0)
Hemoglobin: 14.2 g/dL (ref 12.0–15.0)
Hemoglobin: 13.4 g/dL (ref 12.0–15.0)
MCH: 29.3 pg (ref 26.0–34.0)
MCH: 29.5 pg (ref 26.0–34.0)
MCHC: 32.6 g/dL (ref 30.0–36.0)
MCHC: 33.1 g/dL (ref 30.0–36.0)
MCV: 89.9 fL (ref 80.0–100.0)
MCV: 89 fL (ref 80.0–100.0)
Platelets: 314 10*3/uL (ref 150–400)
Platelets: 279 10*3/uL (ref 150–400)
RBC: 4.84 MIL/uL (ref 3.87–5.11)
RBC: 4.55 MIL/uL (ref 3.87–5.11)
RDW: 13.5 % (ref 11.5–15.5)
RDW: 13.8 % (ref 11.5–15.5)
WBC: 8.2 10*3/uL (ref 4.0–10.5)
WBC: 6.4 10*3/uL (ref 4.0–10.5)
nRBC: 0 % (ref 0.0–0.2)
nRBC: 0 % (ref 0.0–0.2)

## 2021-02-14 LAB — APTT
aPTT: 28 seconds (ref 24–36)
aPTT: 30 seconds (ref 24–36)

## 2021-02-14 LAB — URINALYSIS, ROUTINE W REFLEX MICROSCOPIC
Bacteria, UA: NONE SEEN
Bilirubin Urine: NEGATIVE
Glucose, UA: NEGATIVE mg/dL
Ketones, ur: NEGATIVE mg/dL
Leukocytes,Ua: NEGATIVE
Nitrite: NEGATIVE
Protein, ur: NEGATIVE mg/dL
Specific Gravity, Urine: 1.005 (ref 1.005–1.030)
pH: 6 (ref 5.0–8.0)

## 2021-02-14 LAB — DIFFERENTIAL
Abs Immature Granulocytes: 0.02 10*3/uL (ref 0.00–0.07)
Basophils Absolute: 0.1 10*3/uL (ref 0.0–0.1)
Basophils Relative: 1 %
Eosinophils Absolute: 0.3 10*3/uL (ref 0.0–0.5)
Eosinophils Relative: 3 %
Immature Granulocytes: 0 %
Lymphocytes Relative: 19 %
Lymphs Abs: 1.5 10*3/uL (ref 0.7–4.0)
Monocytes Absolute: 1 10*3/uL (ref 0.1–1.0)
Monocytes Relative: 12 %
Neutro Abs: 5.4 10*3/uL (ref 1.7–7.7)
Neutrophils Relative %: 65 %

## 2021-02-14 LAB — MAGNESIUM: Magnesium: 2.1 mg/dL (ref 1.7–2.4)

## 2021-02-14 LAB — ETHANOL: Alcohol, Ethyl (B): <10 mg/dL (ref <10)

## 2021-02-14 LAB — RAPID URINE DRUG SCREEN, HOSP PERFORMED
Amphetamines: NOT DETECTED
Barbiturates: NOT DETECTED
Benzodiazepines: NOT DETECTED
Cocaine: NOT DETECTED
Opiates: NOT DETECTED
Tetrahydrocannabinol: NOT DETECTED

## 2021-02-14 LAB — LIPID PANEL
Cholesterol: 203 mg/dL — ABNORMAL HIGH (ref 0–200)
HDL: 67 mg/dL (ref >40)
LDL Cholesterol: 125 mg/dL — ABNORMAL HIGH (ref 0–99)
Total CHOL/HDL Ratio: 3 RATIO
Triglycerides: 55 mg/dL (ref <150)
VLDL: 11 mg/dL (ref 0–40)

## 2021-02-14 LAB — PROTIME-INR
INR: 0.9 (ref 0.8–1.2)
INR: 1 (ref 0.8–1.2)
Prothrombin Time: 12.1 seconds (ref 11.4–15.2)
Prothrombin Time: 13.2 seconds (ref 11.4–15.2)

## 2021-02-14 LAB — RESP PANEL BY RT-PCR (FLU A&B, COVID) ARPGX2
Influenza A by PCR: NEGATIVE
Influenza B by PCR: NEGATIVE
SARS Coronavirus 2 by RT PCR: NEGATIVE

## 2021-02-14 LAB — HEMOGLOBIN A1C
Hgb A1c MFr Bld: 6 % — ABNORMAL HIGH (ref 4.8–5.6)
Mean Plasma Glucose: 125.5 mg/dL

## 2021-02-14 LAB — PHOSPHORUS: Phosphorus: 4.4 mg/dL (ref 2.5–4.6)

## 2021-02-14 IMAGING — MR MR HEAD WO/W CM
17 of 20 series · 32 of 48 positions shown · IV contrast (7 ml Gadavist)
Comparison: Noncontrast head CT performed earlier today [DATE].

CLINICAL DATA: Neuro deficit, acute, stroke suspected. Additional
provided: Dizziness with left-sided numbness.

EXAM:
MRI HEAD WITHOUT AND WITH CONTRAST
MRA HEAD WITHOUT CONTRAST
MRA NECK WITHOUT AND WITH CONTRAST
TECHNIQUE: Multiplanar, multi-echo pulse sequences of the brain and surrounding
structures were acquired without and with intravenous contrast.
Angiographic images of the Circle of Willis were acquired using MRA
technique without intravenous contrast. Angiographic images of the
neck were acquired using MRA technique without and with intravenous
contrast. Carotid stenosis measurements (when applicable) are
obtained utilizing NASCET criteria, using the distal internal
carotid diameter as the denominator.
CONTRAST:  7mL GADAVIST GADOBUTROL 1 MMOL/ML IV SOLN

[Series 5: DWI · axial · 4.0mm · 0.88mm/px · z∈[-27,+113]mm · 3 of 36 slices shown (1 of 6)]
[im 1/36]
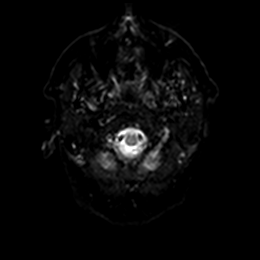
[im 18/36]
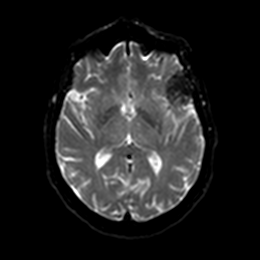
[im 36/36]
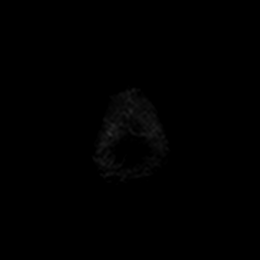

[Series 5: DWI · axial · 4.0mm · 0.88mm/px · z∈[-27,+113]mm · 2 of 36 slices shown (2 of 6)]
[im 1/36]
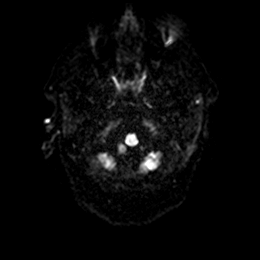
[im 36/36]
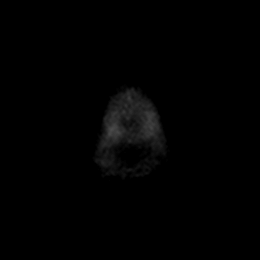

[Series 6: DWI · axial · 4.0mm · 0.88mm/px · z∈[-27,+113]mm · 2 of 36 slices shown (3 of 6)]
[im 1/36]
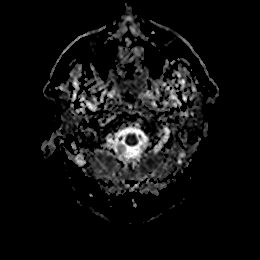
[im 36/36]
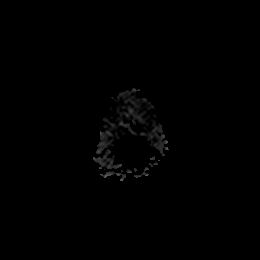

[Series 7: DWI · coronal · 5.0mm · 0.88mm/px · 1 of 28 slices shown (4 of 6)]
[im 1/28]
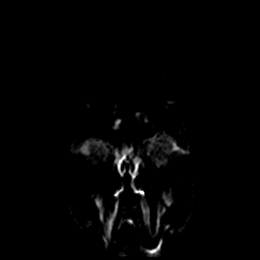

[Series 7: DWI · coronal · 5.0mm · 0.88mm/px · 1 of 28 slices shown (5 of 6)]
[im 1/28]
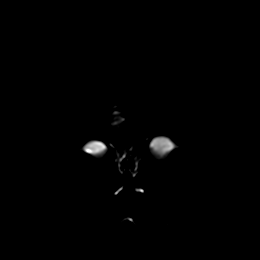

[Series 8: DWI · coronal · 5.0mm · 0.88mm/px · 1 of 28 slices shown (6 of 6)]
[im 1/28]
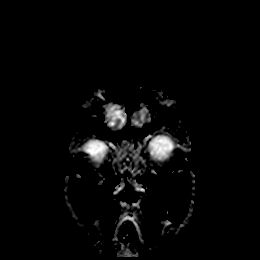

[Series 9: T1 · sagittal · 5.0mm · 0.94mm/px · 1 of 25 slices shown (1 of 2)]
[im 1/25]
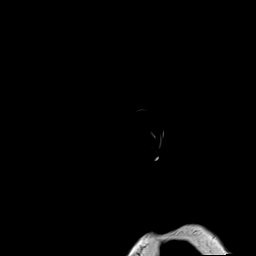

[Series 15: T2 · axial · 5.0mm · 0.72mm/px · 1 of 20 slices shown (1 of 2)]
[im 1/20]
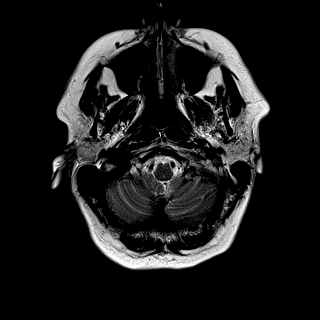

[Series 16: ax hemo · axial · 5.0mm · 0.86mm/px · 1 of 25 slices shown]
[im 1/25]
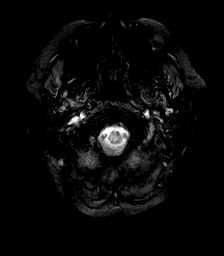

[Series 17: FLAIR · axial · 4.0mm · 0.43mm/px · 1 of 32 slices shown]
[im 1/32]
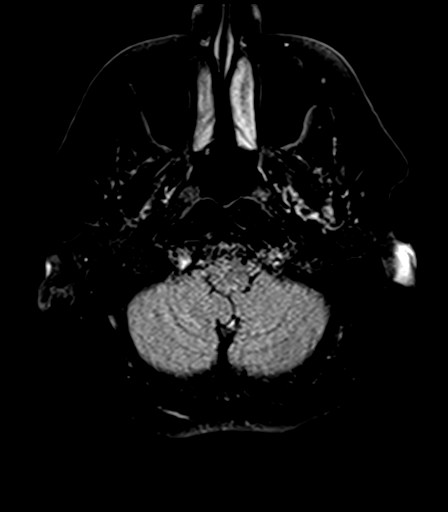

[Series 25: tof_fl3d_tra_iso · axial · 0.6mm · 0.52mm/px · z∈[-171,-96]mm · 6 of 133 slices shown]
[im 1/133]
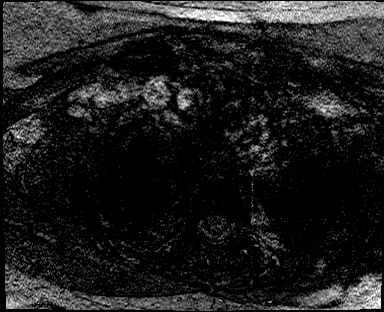
[im 27/133]
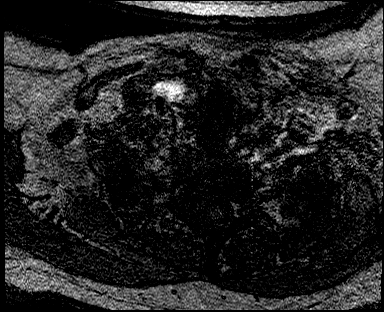
[im 53/133]
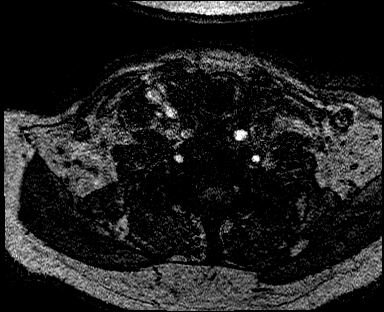
[im 80/133]
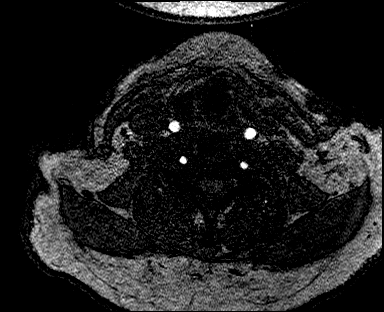
[im 106/133]
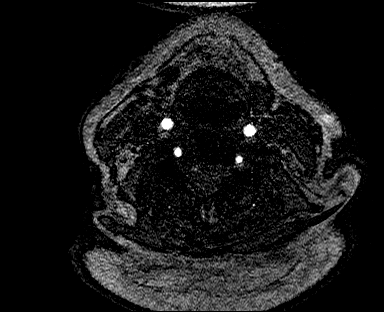
[im 133/133]
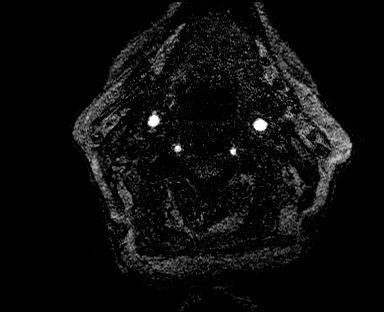

[Series 28: angio_fl3d_cor_pre_ttc=3.0s · coronal · 0.9mm · 0.85mm/px · 3 of 80 slices shown]
[im 1/80]
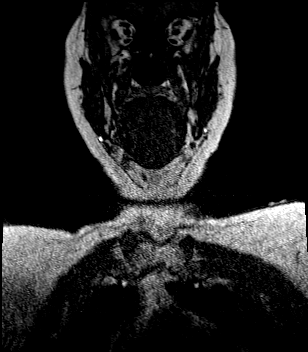
[im 40/80]
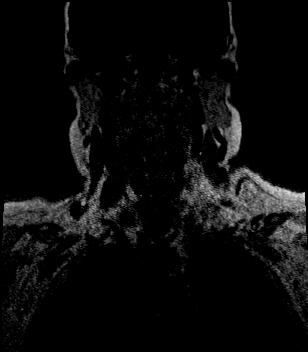
[im 80/80]
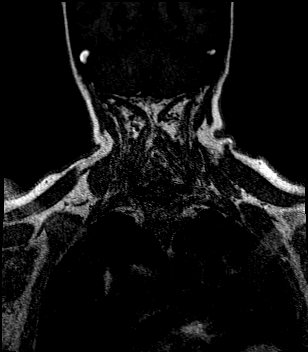

[Series 30: angio_fl3d_cor_post_ttc=3.0s · coronal · 0.9mm · 0.85mm/px · 3 of 80 slices shown]
[im 1/80]
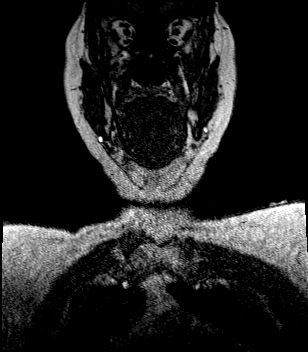
[im 40/80]
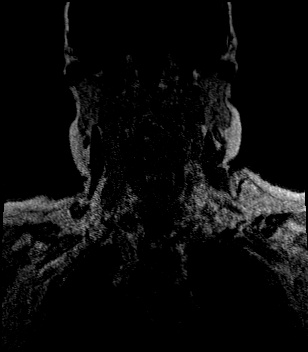
[im 80/80]
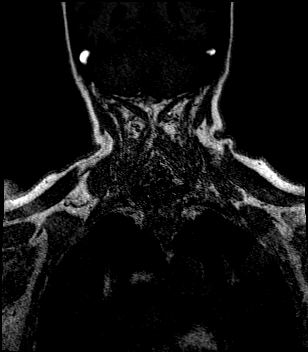

[Series 32: angio_fl3d_cor_post_ttc=3.0s_moco-adv_sub · coronal · 0.9mm · 0.85mm/px · 3 of 80 slices shown]
[im 1/80]
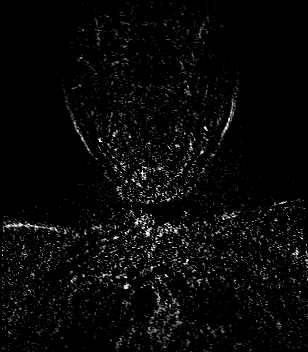
[im 40/80]
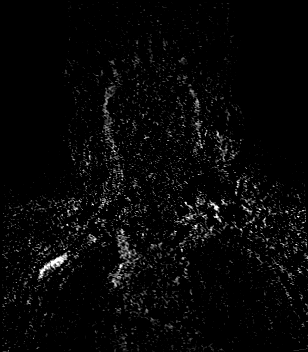
[im 80/80]
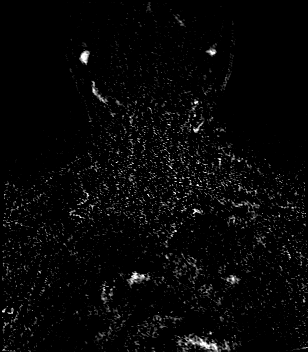

[Series 34: T2 · coronal · 5.0mm · 0.72mm/px · 1 of 28 slices shown (2 of 2)]
[im 1/28]
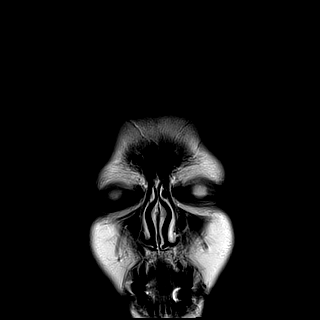

[Series 36: T1 post-contrast · coronal · 5.0mm · 0.34mm/px · 1 of 29 slices shown]
[im 1/29]
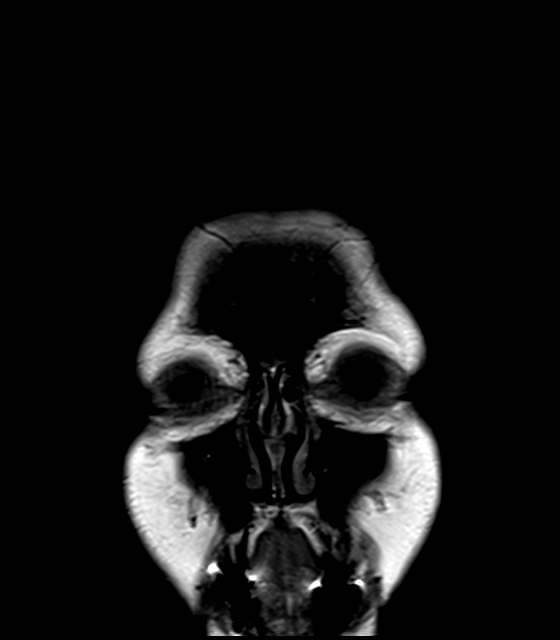

[Series 37: T1 · sagittal · 5.0mm · 0.94mm/px · 1 of 25 slices shown (2 of 2)]
[im 1/25]
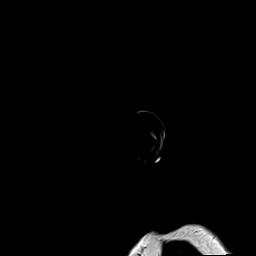

[32 of 48 positions shown; findings below may reference images not displayed]

FINDINGS: MRI HEAD FINDINGS

Brain:

Mild generalized cerebral atrophy.

Calcified and avidly enhancing dural-based mass compatible with
meningioma overlying the lateral left frontal lobe, measuring 3.7 x
2.9 x 3.8 cm (AP x TV x CC). Mass effect upon the underlying left
frontal lobe without significant underlying parenchymal edema. No
midline shift.

No cortical encephalomalacia is identified. No significant cerebral
white matter disease.

There is no acute infarct.

No chronic intracranial blood products.

No extra-axial fluid collection.

No midline shift.

Vascular: Expected proximal arterial flow voids.

Skull and upper cervical spine: Nonspecific T1 hypointense, T2/FLAIR
hyperintense and enhancing lesion within the right parietal
calvarium. Incompletely assessed cervical spondylosis. C3-C4 and
C4-C5 grade 1 anterolisthesis.

Sinuses/Orbits: Visualized orbits show no acute finding. No
significant paranasal sinus disease at the imaged levels.

MRA HEAD FINDINGS

Anterior circulation:

The intracranial internal carotid arteries are patent. Mild to
moderate stenosis within the pre cavernous/proximal cavernous right
ICA. The M1 middle cerebral arteries are patent. No M2 proximal
branch occlusion or high-grade proximal stenosis is identified. The
anterior cerebral arteries are patent. 1-2 mm inflate projecting
vascular protrusion arising from the cavernous right ICA, likely
reflecting an aneurysm. 1-2 mm inflate projecting vascular
protrusion arising from the paraclinoid right ICA, which may reflect
an aneurysm or infundibulum. 1-2 mm inferiorly projecting vascular
protrusion arising from the paraclinoid left ICA, which may reflect
an aneurysm or infundibulum.

Posterior circulation:

The intracranial vertebral arteries are patent. The basilar artery
is patent. The posterior cerebral arteries are patent.
Atherosclerotic irregularity of both vessels. Most notably, there is
a severe stenosis within the P4 right posterior cerebral artery and
a severe stenosis within the left posterior cerebral artery at the
P3/P4 junction. Posterior communicating arteries are hypoplastic or
absent bilaterally.

Anatomic variants: As described.

MRA NECK FINDINGS

Aortic arch: Standard aortic branching. The visualized aortic arch
is normal in caliber. No appreciable hemodynamically significant
innominate or proximal subclavian artery stenosis.

Right carotid system: CCA and ICA patent within the neck without
hemodynamically significant stenosis (50% or greater). Mild
atherosclerotic plaque about the carotid bifurcation.

Left carotid system: CCA and ICA patent within the neck without
hemodynamically significant stenosis (50% or greater).

Vertebral arteries: Vertebral arteries codominant and patent within
the neck with antegrade flow. Apparent moderate stenosis at the
origin of the left vertebral artery.
IMPRESSION: MRI brain:

1. No evidence of acute infarction.
2. Redemonstrated 3.7 x 3.8 cm calcified meningioma overlying the
lateral left frontal lobe. Mass effect upon the underlying left
frontal lobe without significant underlying parenchymal edema. No
midline shift.
3. Mild generalized cerebral atrophy.
4. Indeterminate 5 mm enhancing lesion within the right parietal
calvarium. 4-month MRI follow-up is recommended to ensure stability
and exclude worrisome etiologies (such as osseous metastatic
disease).
5. Incompletely assessed cervical spondylosis.
6. C3-C4 and C4-C5 grade 1 anterolisthesis.

MRA head:

1. No intracranial large vessel occlusion.
2. Intracranial atherosclerotic disease with multifocal stenoses,
most notably as follows.
3. Mild-to-moderate stenosis within the pre-cavernous/cavernous
right ICA.
4. Severe stenosis within the left posterior cerebral artery at the
P3/P4 junction.
5. Severe stenosis within the P4 segment of the right posterior
cerebral artery.
6. 1-2 mm inferiorly projecting vascular protrusion arising from the
cavernous right ICA, likely reflecting an aneurysm.
7. 1-2 mm inferiorly projecting vascular protrusions arising from
the paraclinoid internal carotid arteries bilaterally, which may
reflect aneurysms or infundibula.

MRA neck:

1. Common carotid and internal carotid arteries patent within the
neck without hemodynamically significant stenosis. Mild
atherosclerotic plaque about the right carotid bifurcation.
2. Vertebral arteries patent within the neck with antegrade flow.
Apparent moderate stenosis at the origin of the left vertebral
artery.

## 2021-02-14 IMAGING — MR MR HEAD WO/W CM
7 of 8 series · 32 of 48 positions shown · IV contrast (7 ml Gadavist)
Comparison: Noncontrast head CT performed earlier today [DATE].

CLINICAL DATA: Neuro deficit, acute, stroke suspected. Additional
provided: Dizziness with left-sided numbness.

EXAM:
MRI HEAD WITHOUT AND WITH CONTRAST
MRA HEAD WITHOUT CONTRAST
MRA NECK WITHOUT AND WITH CONTRAST
TECHNIQUE: Multiplanar, multi-echo pulse sequences of the brain and surrounding
structures were acquired without and with intravenous contrast.
Angiographic images of the Circle of Willis were acquired using MRA
technique without intravenous contrast. Angiographic images of the
neck were acquired using MRA technique without and with intravenous
contrast. Carotid stenosis measurements (when applicable) are
obtained utilizing NASCET criteria, using the distal internal
carotid diameter as the denominator.
CONTRAST:  7mL GADAVIST GADOBUTROL 1 MMOL/ML IV SOLN

[Series 11: tof_fl3d_tra_iso · axial · 0.6mm · 0.52mm/px · z∈[-78,+53]mm · 11 of 257 slices shown]
[im 17/257]
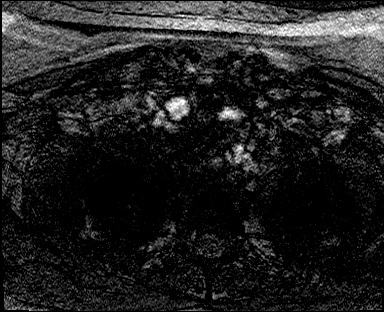
[im 33/257]
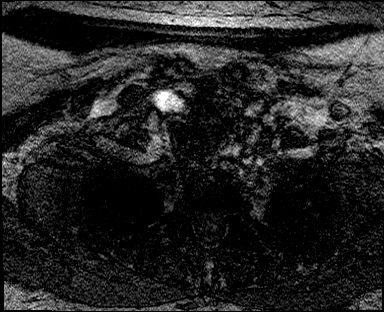
[im 49/257]
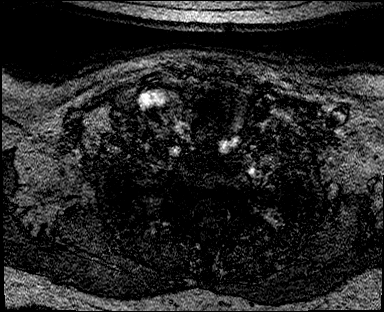
[im 81/257]
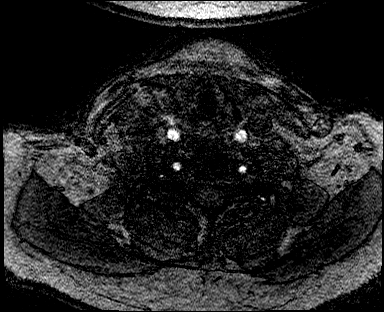
[im 113/257]
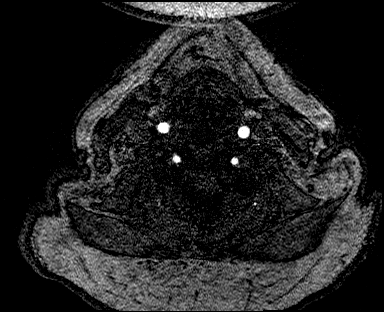
[im 129/257]
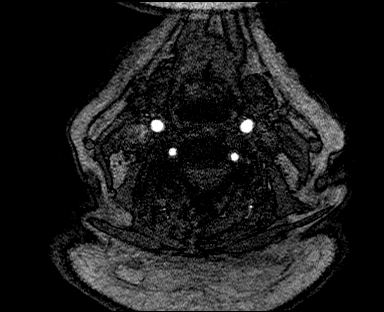
[im 145/257]
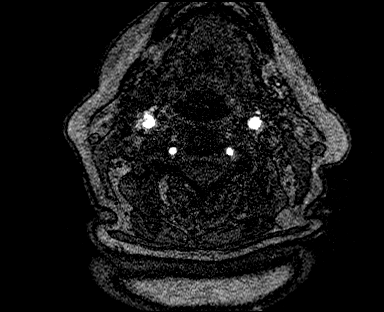
[im 177/257]
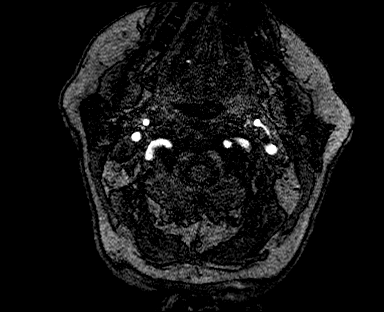
[im 209/257]
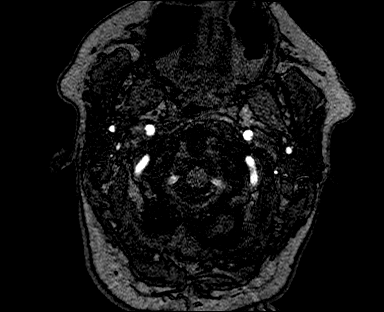
[im 225/257]
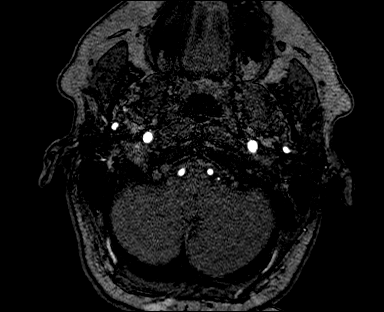
[im 241/257]
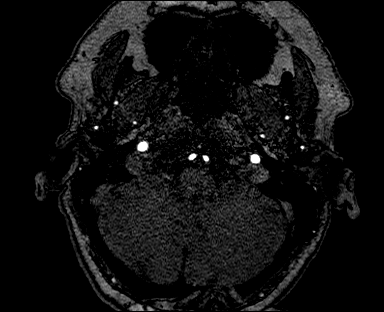

[Series 14: angio_fl3d_cor_pre_ttc=3.0s · coronal · 0.9mm · 0.85mm/px · 5 of 80 slices shown]
[im 1/80]
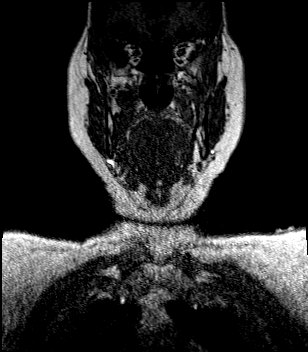
[im 20/80]
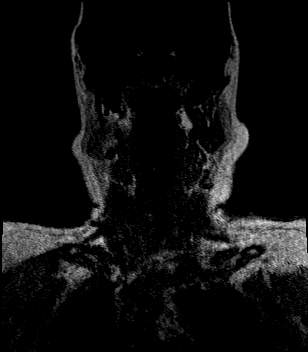
[im 40/80]
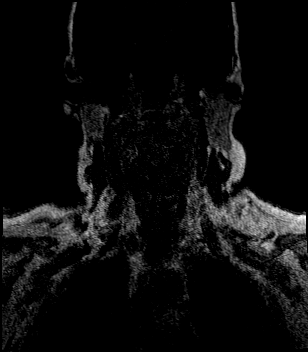
[im 60/80]
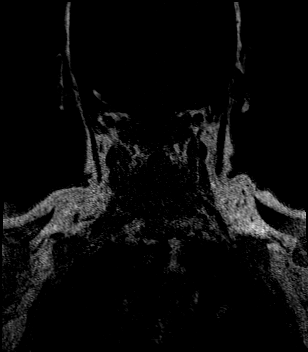
[im 80/80]
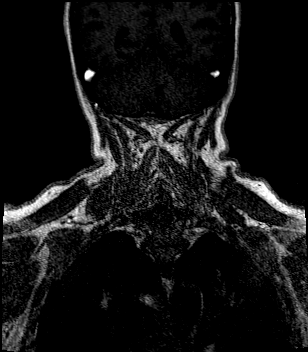

[Series 16: angio_fl3d_cor_post_ttc=3.0s · coronal · 0.9mm · 0.85mm/px · 6 of 80 slices shown]
[im 1/80]
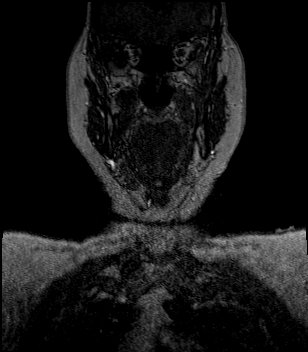
[im 16/80]
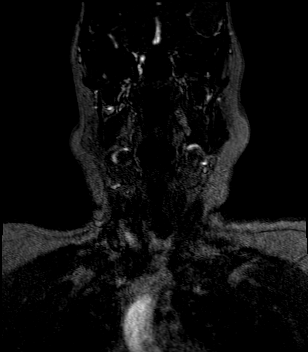
[im 32/80]
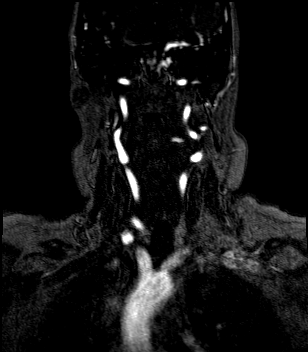
[im 48/80]
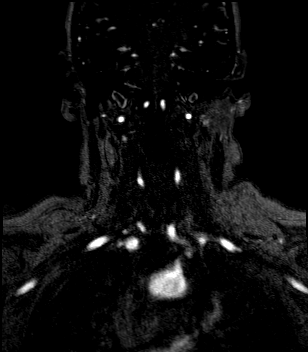
[im 64/80]
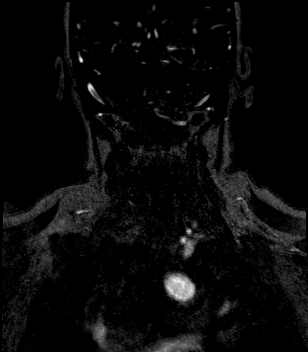
[im 80/80]
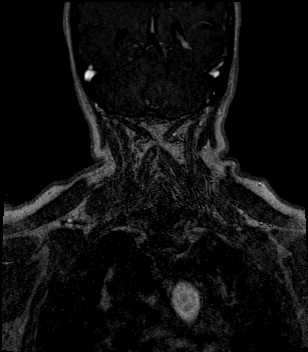

[Series 18: angio_fl3d_cor_post_ttc=3.0s_moco-adv_sub · coronal · 0.9mm · 0.85mm/px · 5 of 76 slices shown]
[im 1/76]
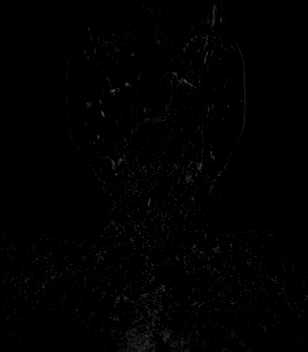
[im 19/76]
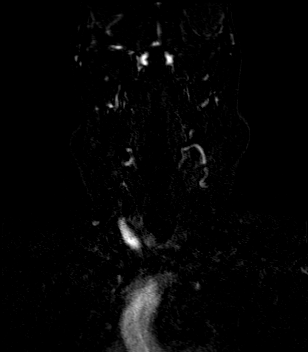
[im 38/76]
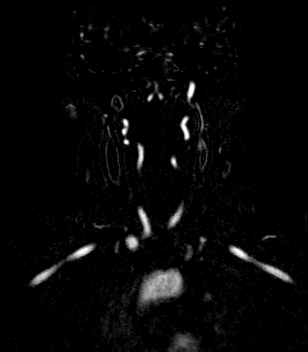
[im 57/76]
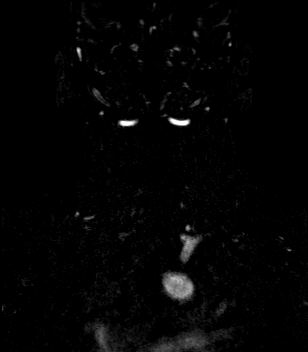
[im 76/76]
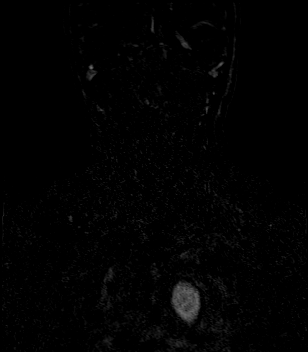

[Series 20: T2 post-contrast · coronal · 5.0mm · 0.72mm/px · 2 of 28 slices shown]
[im 1/28]
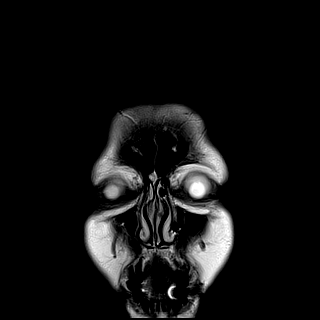
[im 28/28]
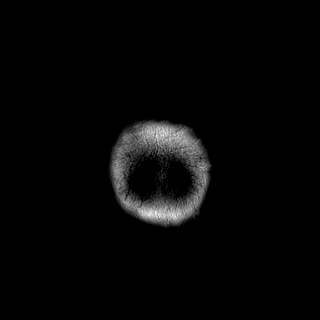

[Series 22: T1 post-contrast · coronal · 5.0mm · 0.34mm/px · 2 of 28 slices shown]
[im 1/28]
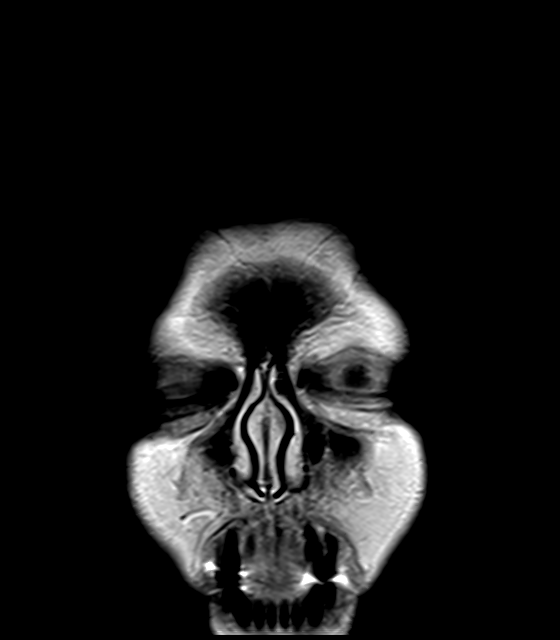
[im 28/28]
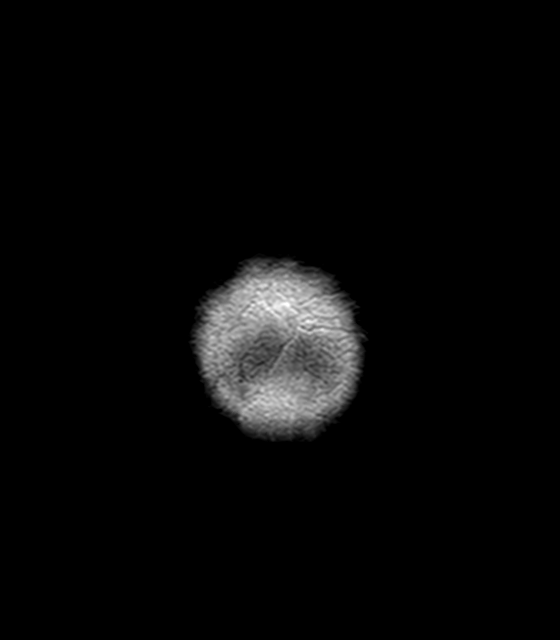

[Series 1018: rotate · 0.53mm/px · 1 of 3 slices shown]
[im 1/3]
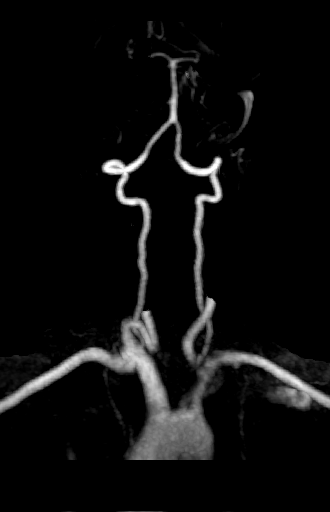

[32 of 48 positions shown; findings below may reference images not displayed]

FINDINGS: MRI HEAD FINDINGS

Brain:

Mild generalized cerebral atrophy.

Calcified and avidly enhancing dural-based mass compatible with
meningioma overlying the lateral left frontal lobe, measuring 3.7 x
2.9 x 3.8 cm (AP x TV x CC). Mass effect upon the underlying left
frontal lobe without significant underlying parenchymal edema. No
midline shift.

No cortical encephalomalacia is identified. No significant cerebral
white matter disease.

There is no acute infarct.

No chronic intracranial blood products.

No extra-axial fluid collection.

No midline shift.

Vascular: Expected proximal arterial flow voids.

Skull and upper cervical spine: Nonspecific T1 hypointense, T2/FLAIR
hyperintense and enhancing lesion within the right parietal
calvarium. Incompletely assessed cervical spondylosis. C3-C4 and
C4-C5 grade 1 anterolisthesis.

Sinuses/Orbits: Visualized orbits show no acute finding. No
significant paranasal sinus disease at the imaged levels.

MRA HEAD FINDINGS

Anterior circulation:

The intracranial internal carotid arteries are patent. Mild to
moderate stenosis within the pre cavernous/proximal cavernous right
ICA. The M1 middle cerebral arteries are patent. No M2 proximal
branch occlusion or high-grade proximal stenosis is identified. The
anterior cerebral arteries are patent. 1-2 mm inflate projecting
vascular protrusion arising from the cavernous right ICA, likely
reflecting an aneurysm. 1-2 mm inflate projecting vascular
protrusion arising from the paraclinoid right ICA, which may reflect
an aneurysm or infundibulum. 1-2 mm inferiorly projecting vascular
protrusion arising from the paraclinoid left ICA, which may reflect
an aneurysm or infundibulum.

Posterior circulation:

The intracranial vertebral arteries are patent. The basilar artery
is patent. The posterior cerebral arteries are patent.
Atherosclerotic irregularity of both vessels. Most notably, there is
a severe stenosis within the P4 right posterior cerebral artery and
a severe stenosis within the left posterior cerebral artery at the
P3/P4 junction. Posterior communicating arteries are hypoplastic or
absent bilaterally.

Anatomic variants: As described.

MRA NECK FINDINGS

Aortic arch: Standard aortic branching. The visualized aortic arch
is normal in caliber. No appreciable hemodynamically significant
innominate or proximal subclavian artery stenosis.

Right carotid system: CCA and ICA patent within the neck without
hemodynamically significant stenosis (50% or greater). Mild
atherosclerotic plaque about the carotid bifurcation.

Left carotid system: CCA and ICA patent within the neck without
hemodynamically significant stenosis (50% or greater).

Vertebral arteries: Vertebral arteries codominant and patent within
the neck with antegrade flow. Apparent moderate stenosis at the
origin of the left vertebral artery.
IMPRESSION: MRI brain:

1. No evidence of acute infarction.
2. Redemonstrated 3.7 x 3.8 cm calcified meningioma overlying the
lateral left frontal lobe. Mass effect upon the underlying left
frontal lobe without significant underlying parenchymal edema. No
midline shift.
3. Mild generalized cerebral atrophy.
4. Indeterminate 5 mm enhancing lesion within the right parietal
calvarium. 4-month MRI follow-up is recommended to ensure stability
and exclude worrisome etiologies (such as osseous metastatic
disease).
5. Incompletely assessed cervical spondylosis.
6. C3-C4 and C4-C5 grade 1 anterolisthesis.

MRA head:

1. No intracranial large vessel occlusion.
2. Intracranial atherosclerotic disease with multifocal stenoses,
most notably as follows.
3. Mild-to-moderate stenosis within the pre-cavernous/cavernous
right ICA.
4. Severe stenosis within the left posterior cerebral artery at the
P3/P4 junction.
5. Severe stenosis within the P4 segment of the right posterior
cerebral artery.
6. 1-2 mm inferiorly projecting vascular protrusion arising from the
cavernous right ICA, likely reflecting an aneurysm.
7. 1-2 mm inferiorly projecting vascular protrusions arising from
the paraclinoid internal carotid arteries bilaterally, which may
reflect aneurysms or infundibula.

MRA neck:

1. Common carotid and internal carotid arteries patent within the
neck without hemodynamically significant stenosis. Mild
atherosclerotic plaque about the right carotid bifurcation.
2. Vertebral arteries patent within the neck with antegrade flow.
Apparent moderate stenosis at the origin of the left vertebral
artery.

## 2021-02-14 MED ORDER — GADOBUTROL 1 MMOL/ML IV SOLN
7.0000 mL | Freq: Once | INTRAVENOUS | Status: AC | PRN
  Administered 2021-02-14: 7 mL via INTRAVENOUS

## 2021-02-14 MED ORDER — ASPIRIN EC 81 MG PO TBEC
81.0000 mg | DELAYED_RELEASE_TABLET | Freq: Every day | ORAL | Status: DC
  Administered 2021-02-14 – 2021-02-15 (×2): 81 mg via ORAL
  Filled 2021-02-14 (×2): qty 1

## 2021-02-14 MED ORDER — LORAZEPAM 2 MG/ML IJ SOLN: 0.5000 mg | Freq: Once | INTRAMUSCULAR | Status: DC

## 2021-02-14 MED ORDER — ALTEPLASE 100 MG IV SOLR
INTRAVENOUS | Status: AC
  Filled 2021-02-14: qty 100

## 2021-02-14 MED ORDER — POTASSIUM CHLORIDE CRYS ER 20 MEQ PO TBCR
40.0000 meq | EXTENDED_RELEASE_TABLET | Freq: Once | ORAL | Status: AC
  Administered 2021-02-14: 40 meq via ORAL
  Filled 2021-02-14: qty 2

## 2021-02-14 MED ORDER — ASPIRIN 81 MG PO CHEW
324.0000 mg | CHEWABLE_TABLET | Freq: Once | ORAL | Status: AC
  Administered 2021-02-14: 324 mg via ORAL
  Filled 2021-02-14: qty 4

## 2021-02-14 NOTE — Progress Notes (Signed)
Patient was admitted early this morning.  H&P reviewed.  Patient seen and examined.  Patient presented with left-sided paresthesias.  Appears to have resolved for the most part.  Patient is asking about going home.  Vital signs are stable. Lungs are clear to auscultation bilaterally. S1-S2 is normal regular.  No S3-S4.  No rubs or bruit. Abdomen is soft.  Nontender nondistended No cranial nerve deficits.  She is alert and oriented x3.  Motor strength is equal bilateral upper and lower extremity.  Normal pronator drift.  LDL noted to be 125.  INR 1.0.  Urine drug screen unremarkable.  Patient with past medical history of Essential hypertension and hyperlipidemia who comes in with left-sided paresthesias.  Concern is for TIA.  Work-up is still pending including MRI brain, echocardiogram, EEG, carotid Doppler.  PT and OT evaluation is pending.    Patient takes aspirin at home which is being continued.  May need to change to Plavix at discharge.  Patient with intolerance to statins.  Takes Zetia at home.  Waiting on work-up to be completed.  We will continue to follow.  Bonnielee Haff 02/14/2021

## 2021-02-14 NOTE — Procedures (Signed)
Patient Name: Dorothy Powers  MRN: [id number]  Epilepsy Attending: Lora Havens  Referring Physician/Provider: Dr Bernadette Hoit Date: 02/14/2021  Duration: 23.32 mins  Patient history: 75 yr old female with significant PMHx of HTN, HLD, was at home in her [facility]alt when while she was doing dished rpeorts sudden left F/A/L numbness associated with dizziness and lightheadedness, this lasted <45secs. she reports this same event occurred 3 more times and completely resolves. Eeg to evaluate for seizure  Level of alertness: Awake, asleep  AEDs during EEG study:   Technical aspects: This EEG study was done with scalp electrodes positioned according to the 10-20 International system of electrode placement. Electrical activity was acquired at a sampling rate of '500Hz'$  and reviewed with a high frequency filter of '70Hz'$  and a low frequency filter of '1Hz'$ . EEG data were recorded continuously and digitally stored.   Description: The posterior dominant rhythm consists of 9 Hz activity of moderate voltage (25-35 uV) seen predominantly in posterior head regions, symmetric and reactive to eye opening and eye closing. Sleep was characterized by vertex waves, sleep spindles (12 to 14 Hz), maximal frontocentral region. EEG showed intermittent bitemporal 3 to 6 Hz theta-delta slowing. Sharp transients were noted in left frontotemporal region. Hyperventilation and photic stimulation were not performed.     ABNORMALITY - Intermittent slow, bitemporal region  IMPRESSION: This study is suggestive of cortical dysfunction arising from bitemporal region, nonspecific etiology. No seizures or definite epileptiform discharges were seen throughout the recording.  Gillie Crisci Barbra Sarks

## 2021-02-14 NOTE — ED Provider Notes (Signed)
[facility] EMERGENCY DEPARTMENT Provider Note   CSN: [id number] Arrival date & time: 02/14/21  0010     History Chief Complaint  Patient presents with   Dizziness   Numbness and Tingling    Dorothy Powers is a 75 y.o. female.  Patient with a history of hypertension here with left-sided numbness and tingling and dizziness.  States symptoms started about 10:30 PM.  She describes numbness involving her entire left side of her body involving her face, arm and leg.  First episode was associated with dizziness.  Symptoms are coming and going and do resolve completely.  She states that last for several minutes but then returns.  Has had about 4 episodes since 10:30 PM. Each episode lasting a minute or less. She does not have any numbness currently.  There is some weakness in her left arm.  She has not noticed any weakness in her leg.  No difficulty speaking or difficulty swallowing.  No chest pain or shortness of breath.  No visual changes. No headache.  The history is provided by the patient.  Dizziness Associated symptoms: weakness   Associated symptoms: no chest pain, no headaches, no nausea, no shortness of breath and no vomiting       Past Medical History:  Diagnosis Date   Hypertension    Tachycardia     Patient Active Problem List   Diagnosis Date Noted   Essential hypertension 12/14/2020   Gastro-esophageal reflux disease without esophagitis 12/14/2020   Hematochezia 12/14/2020   Morbid obesity ([place]) 12/14/2020   Osteoarthritis 12/14/2020   Personal history of colonic polyps 12/14/2020   Pure hypercholesterolemia 12/14/2020    Past Surgical History:  Procedure Laterality Date   ABDOMINAL HYSTERECTOMY       OB History   No obstetric history on file.     History reviewed. No pertinent family history.  Social History   Tobacco Use   Smoking status: Never   Smokeless tobacco: Never  Substance Use Topics   Alcohol use: Yes    Home Medications Prior to  Admission medications   Medication Sig Start Date End Date Taking? Authorizing Provider  amLODipine (NORVASC) 5 MG tablet Take 1 tablet by mouth daily. 11/29/20   [provider]  Ascorbic Acid (VITAMIN C PO) Take by mouth.    [provider]  aspirin EC 81 MG tablet Take 81 mg by mouth daily. Swallow whole.    [provider]  atenolol (TENORMIN) 50 MG tablet Take 50 mg by mouth 2 (two) times daily. 09/02/20   [provider]  azelastine (ASTELIN) 0.1 % nasal spray USE 1 SPRAY IN EACH NOSTRIL TWICE DAILY AS NEEDED NASALLY. 08/02/20   [provider]  calcium carbonate (OSCAL) 1500 (600 Ca) MG TABS tablet Take by mouth 2 (two) times daily with a meal.    [provider]  ezetimibe (ZETIA) 10 MG tablet Take 10 mg by mouth daily. 06/27/20   [provider]  fluticasone (FLONASE) 50 MCG/ACT nasal spray Place into both nostrils daily.    [provider]  glucosamine-chondroitin 500-400 MG tablet Take 1 tablet by mouth 3 (three) times daily.    [provider]  lisinopril (ZESTRIL) 20 MG tablet Take 20 mg by mouth daily. 08/22/20   [provider]  loratadine (CLARITIN) 10 MG tablet Take 10 mg by mouth daily.    [provider]  Naproxen Sodium (ALEVE PO) Take by mouth.    [provider]  ofloxacin (OCUFLOX)  0.3 % ophthalmic solution PLACE ONE DROP INTO OPERATIVE EYE FOUR TIMES A DAY, BEGINNING ONE DAY PRIOR TO SURGERY 10/12/20   [provider]  Omega-3 Fatty Acids (FISH OIL PO) Take by mouth.    [provider]  pantoprazole (PROTONIX) 40 MG tablet Take 40 mg by mouth daily. 09/20/20   [provider]  prednisoLONE acetate (PRED FORTE) 1 % ophthalmic suspension PLACE ONE DROP INTO OPERATIVE EYE FOUR TIMES A DAY, BEGINNING AFTER SURGERY 10/12/20   [provider]  PROLENSA 0.07 % SOLN PLACE ONE DROP INTO OPERATIVE EYE ONCE DAILY, BEGINNING ONE DAY PRIOR TO SURGERY  10/12/20   [provider]    Allergies    Codeine, Lovastatin, and Sulfa antibiotics  Review of Systems   Review of Systems  Constitutional:  Negative for activity change, appetite change and fever.  HENT:  Negative for congestion.   Respiratory:  Negative for cough, chest tightness and shortness of breath.   Cardiovascular:  Negative for chest pain.  Gastrointestinal:  Negative for abdominal pain, nausea, rectal pain and vomiting.  Genitourinary:  Negative for dysuria and hematuria.  Musculoskeletal:  Negative for arthralgias and myalgias.  Skin:  Negative for rash.  Neurological:  Positive for dizziness, weakness and numbness. Negative for headaches.   all other systems are negative except as noted in the HPI and PMH.   Physical Exam Updated Vital Signs BP (!) 178/90   Pulse 84   Temp 98.3 F (36.8 C)   Resp 18   Ht '5\' 3"'$  (1.6 m)   Wt 96.2 kg   SpO2 94%   BMI 37.55 kg/m   Physical Exam Vitals and nursing note reviewed.  Constitutional:      General: She is not in acute distress.    Appearance: She is well-developed.  HENT:     Head: Normocephalic and atraumatic.     Mouth/Throat:     Pharynx: No oropharyngeal exudate.  Eyes:     Conjunctiva/sclera: Conjunctivae normal.     Pupils: Pupils are equal, round, and reactive to light.  Neck:     Comments: No meningismus. Cardiovascular:     Rate and Rhythm: Normal rate and regular rhythm.     Heart sounds: Normal heart sounds. No murmur heard. Pulmonary:     Effort: Pulmonary effort is normal. No respiratory distress.     Breath sounds: Normal breath sounds.  Abdominal:     Palpations: Abdomen is soft.     Tenderness: There is no abdominal tenderness. There is no guarding or rebound.  Musculoskeletal:        General: No tenderness. Normal range of motion.     Cervical back: Normal range of motion and neck supple.  Skin:    General: Skin is warm.  Neurological:     Mental Status: She is alert and  oriented to person, place, and time.     Cranial Nerves: No cranial nerve deficit.     Sensory: Sensory deficit present.     Motor: No abnormal muscle tone.     Coordination: Coordination normal.     Comments: Numbness to left side subjectively.  CN 2-12 intact, no ataxia on finger to nose, no nystagmus, 5/5 strength throughout other than questionably decreased left grip strength, no pronator drift, Romberg negative, normal gait.   Psychiatric:        Behavior: Behavior normal.    ED Results / Procedures / Treatments   Labs (all labs ordered are listed, but only abnormal  results are displayed) Labs Reviewed  COMPREHENSIVE METABOLIC PANEL - Abnormal; Notable for the following components:      Result Value   Potassium 3.4 (*)    Glucose, Bld 125 (*)    All other components within normal limits  URINALYSIS, ROUTINE W REFLEX MICROSCOPIC - Abnormal; Notable for the following components:   Color, Urine STRAW (*)    Hgb urine dipstick SMALL (*)    All other components within normal limits  I-STAT CHEM 8, ED - Abnormal; Notable for the following components:   Glucose, Bld 127 (*)    All other components within normal limits  RESP PANEL BY RT-PCR (FLU A&B, COVID) ARPGX2  ETHANOL  PROTIME-INR  APTT  CBC  DIFFERENTIAL  RAPID URINE DRUG SCREEN, HOSP PERFORMED    EKG EKG Interpretation  Date/Time:  Wednesday February 14 2021 01:09:06 EDT Ventricular Rate:  86 PR Interval:  193 QRS Duration: 100 QT Interval:  386 QTC Calculation: 462 R Axis:   -29 Text Interpretation: Sinus rhythm Borderline left axis deviation Low voltage, precordial leads Abnormal R-wave progression, early transition No previous ECGs available Confirmed by Ezequiel Essex [id number]) on 02/14/2021 1:59:48 AM  Radiology CT HEAD CODE STROKE WO CONTRAST  Result Date: 02/14/2021 CLINICAL DATA:  Code stroke.  Dizziness with left-sided numbness EXAM: CT HEAD WITHOUT CONTRAST TECHNIQUE: Contiguous axial images were obtained  from the base of the skull through the vertex without intravenous contrast. COMPARISON:  None. FINDINGS: Brain: Calcified meningioma at the anterior left convexity. No associated edema within the underlying brain. No acute hemorrhage. The size and configuration of the ventricles and extra-axial CSF spaces are normal. The brain parenchyma is normal, without evidence of acute or chronic infarction. Vascular: No abnormal hyperdensity of the major intracranial arteries or dural venous sinuses. No intracranial atherosclerosis. Skull: The visualized skull base, calvarium and extracranial soft tissues are normal. Sinuses/Orbits: No fluid levels or advanced mucosal thickening of the visualized paranasal sinuses. No mastoid or middle ear effusion. The orbits are normal. ASPECTS [facility] Stroke Program Early CT Score) - Ganglionic level infarction (caudate, lentiform nuclei, internal capsule, insula, M1-M3 cortex): 7 - Supraganglionic infarction (M4-M6 cortex): 3 Total score (0-10 with 10 being normal): 10 IMPRESSION: 1. No acute intracranial abnormality. 2. ASPECTS is 10. 3. Calcified meningioma at the anterior left convexity without associated edema within the underlying brain. These results were called by telephone at the time of interpretation on 02/14/2021 at 1:10 am to provider [facility] , who verbally acknowledged these results. Electronically Signed   By: Ulyses Jarred M.D.   On: 02/14/2021 01:10    Procedures .Critical Care  Date/Time: 02/14/2021 2:19 AM Performed by: Ezequiel Essex, MD Authorized by: Ezequiel Essex, MD   Critical care provider statement:    Critical care time (minutes):  45   Critical care was necessary to treat or prevent imminent or life-threatening deterioration of the following conditions:  CNS failure or compromise   Critical care was time spent personally by me on the following activities:  Discussions with consultants, evaluation of patient's response to treatment,  examination of patient, ordering and performing treatments and interventions, ordering and review of laboratory studies, ordering and review of radiographic studies, pulse oximetry, re-evaluation of patient's condition, obtaining history from patient or surrogate and review of old charts   Medications Ordered in ED Medications - No data to display  ED Course  I have reviewed the triage vital signs and the nursing notes.  Pertinent labs & imaging results that  were available during my care of the patient were reviewed by me and considered in my medical decision making (see chart for details).    MDM Rules/Calculators/A&P                          Waxing and waning left-sided numbness since 10:30 PM.  Does have subtle weakness of her left arm. Code stroke activated  CT head is negative for hemorrhage.  Patient's left-sided numbness is improving.  Discussed with Dr. Leory Plowman of neurology who has seen patient.  She does not recommend tPA given improving deficits and low NIH score. Is recommended for admission for MRI and TIA stroke work-up  Per neurology: Advise admit for MRI brain w/w/o contrast, MRA head and neck, 2d echo, continue ASA , pending her imaging studies consider EEG, neurology follow-up  Admission d/w Dr. Josephine Cables. Final Clinical Impression(s) / ED Diagnoses Final diagnoses:  None    Rx / DC Orders ED Discharge Orders     None        Allyah Heather, Annie Main, MD 02/14/21 [phone]

## 2021-02-14 NOTE — Progress Notes (Signed)
Code stroke  Call time  No call Bee[place]   1255am Started   [place] called  [phone]

## 2021-02-14 NOTE — Progress Notes (Signed)
OT Cancellation Note  Patient Details Name: Dorothy Powers MRN: [id number] DOB: January 29, 1946   Cancelled Treatment:    Reason Eval/Treat Not Completed: OT screened, no needs identified, will sign off. MRI negative for acute infarct. Per chart review pt completing 200+ feet of mobility including stairs independently. All symptoms resolved. No further acute OT services required at this time.    Guadelupe Sabin, OTR/L  [phone] 02/14/2021, 9:12 PM

## 2021-02-14 NOTE — Progress Notes (Signed)
Providing Compassionate, Quality Care - Together   Patient admitted to [facility] following several transient episodes of left-sided numbness and tingling with associated lightheadedness and dizziness. Imaging revealed an incidental finding of a left-sided meningioma. The patient appears to be asymptomatic in regard to the meningioma. An EEG was performed with no seizure activity identified. Recommend routine follow up with Dr. Arnoldo Morale as an outpatient. Follow up information added to patient's discharge instructions.   MR ANGIO HEAD WO CONTRAST  Result Date: 02/14/2021 CLINICAL DATA:  Neuro deficit, acute, stroke suspected. Additional provided: Dizziness with left-sided numbness. EXAM: MRI HEAD WITHOUT AND WITH CONTRAST MRA HEAD WITHOUT CONTRAST MRA NECK WITHOUT AND WITH CONTRAST TECHNIQUE: Multiplanar, multi-echo pulse sequences of the brain and surrounding structures were acquired without and with intravenous contrast. Angiographic images of the Circle of Willis were acquired using MRA technique without intravenous contrast. Angiographic images of the neck were acquired using MRA technique without and with intravenous contrast. Carotid stenosis measurements (when applicable) are obtained utilizing NASCET criteria, using the distal internal carotid diameter as the denominator. CONTRAST:  29m GADAVIST GADOBUTROL 1 MMOL/ML IV SOLN COMPARISON:  Noncontrast head CT performed earlier today 02/14/2021. FINDINGS: MRI HEAD FINDINGS Brain: Mild generalized cerebral atrophy. Calcified and avidly enhancing dural-based mass compatible with meningioma overlying the lateral left frontal lobe, measuring 3.7 x 2.9 x 3.8 cm (AP x TV x CC). Mass effect upon the underlying left frontal lobe without significant underlying parenchymal edema. No midline shift. No cortical encephalomalacia is identified. No significant cerebral white matter disease. There is no acute infarct. No chronic intracranial blood products. No extra-axial  fluid collection. No midline shift. Vascular: Expected proximal arterial flow voids. Skull and upper cervical spine: Nonspecific T1 hypointense, T2/FLAIR hyperintense and enhancing lesion within the right parietal calvarium. Incompletely assessed cervical spondylosis. C3-C4 and C4-C5 grade 1 anterolisthesis. Sinuses/Orbits: Visualized orbits show no acute finding. No significant paranasal sinus disease at the imaged levels. MRA HEAD FINDINGS Anterior circulation: The intracranial internal carotid arteries are patent. Mild to moderate stenosis within the pre cavernous/proximal cavernous right ICA. The M1 middle cerebral arteries are patent. No M2 proximal branch occlusion or high-grade proximal stenosis is identified. The anterior cerebral arteries are patent. 1-2 mm inflate projecting vascular protrusion arising from the cavernous right ICA, likely reflecting an aneurysm. 1-2 mm inflate projecting vascular protrusion arising from the paraclinoid right ICA, which may reflect an aneurysm or infundibulum. 1-2 mm inferiorly projecting vascular protrusion arising from the paraclinoid left ICA, which may reflect an aneurysm or infundibulum. Posterior circulation: The intracranial vertebral arteries are patent. The basilar artery is patent. The posterior cerebral arteries are patent. Atherosclerotic irregularity of both vessels. Most notably, there is a severe stenosis within the P4 right posterior cerebral artery and a severe stenosis within the left posterior cerebral artery at the P3/P4 junction. Posterior communicating arteries are hypoplastic or absent bilaterally. Anatomic variants: As described. MRA NECK FINDINGS Aortic arch: Standard aortic branching. The visualized aortic arch is normal in caliber. No appreciable hemodynamically significant innominate or proximal subclavian artery stenosis. Right carotid system: CCA and ICA patent within the neck without hemodynamically significant stenosis (50% or greater). Mild  atherosclerotic plaque about the carotid bifurcation. Left carotid system: CCA and ICA patent within the neck without hemodynamically significant stenosis (50% or greater). Vertebral arteries: Vertebral arteries codominant and patent within the neck with antegrade flow. Apparent moderate stenosis at the origin of the left vertebral artery. IMPRESSION: MRI brain: 1. No evidence of acute infarction. 2. Redemonstrated  3.7 x 3.8 cm calcified meningioma overlying the lateral left frontal lobe. Mass effect upon the underlying left frontal lobe without significant underlying parenchymal edema. No midline shift. 3. Mild generalized cerebral atrophy. 4. Indeterminate 5 mm enhancing lesion within the right parietal calvarium. 10-monthMRI follow-up is recommended to ensure stability and exclude worrisome etiologies (such as osseous metastatic disease). 5. Incompletely assessed cervical spondylosis. 6. C3-C4 and C4-C5 grade 1 anterolisthesis. MRA head: 1. No intracranial large vessel occlusion. 2. Intracranial atherosclerotic disease with multifocal stenoses, most notably as follows. 3. Mild-to-moderate stenosis within the pre-cavernous/cavernous right ICA. 4. Severe stenosis within the left posterior cerebral artery at the P3/P4 junction. 5. Severe stenosis within the P4 segment of the right posterior cerebral artery. 6. 1-2 mm inferiorly projecting vascular protrusion arising from the cavernous right ICA, likely reflecting an aneurysm. 7. 1-2 mm inferiorly projecting vascular protrusions arising from the paraclinoid internal carotid arteries bilaterally, which may reflect aneurysms or infundibula. MRA neck: 1. Common carotid and internal carotid arteries patent within the neck without hemodynamically significant stenosis. Mild atherosclerotic plaque about the right carotid bifurcation. 2. Vertebral arteries patent within the neck with antegrade flow. Apparent moderate stenosis at the origin of the left vertebral artery.  Electronically Signed   By: KKellie SimmeringDO   On: 02/14/2021 13:19    EEG adult  Result Date: 02/14/2021 YLora Havens MD     02/14/2021 11:01 AM Patient Name: Dorothy OFFERMANMRN: [id number] Attending: PLora HavensReferring Physician/Provider: Dr ABernadette HoitDate: 02/14/2021 Duration: 23.32 mins Patient history: 75yr old female with significant PMHx of HTN, HLD, was at home in her [place]alt when while she was doing dished rpeorts sudden left F/A/L numbness associated with dizziness and lightheadedness, this lasted <45secs. she reports this same event occurred 3 more times and completely resolves. Eeg to evaluate for seizure Level of alertness: Awake, asleep AEDs during EEG study: Technical aspects: This EEG study was done with scalp electrodes positioned according to the 10-20 International system of electrode placement. Electrical activity was acquired at a sampling rate of '500Hz'$  and reviewed with a high frequency filter of '70Hz'$  and a low frequency filter of '1Hz'$ . EEG data were recorded continuously and digitally stored. Description: The posterior dominant rhythm consists of 9 Hz activity of moderate voltage (25-35 uV) seen predominantly in posterior head regions, symmetric and reactive to eye opening and eye closing. Sleep was characterized by vertex waves, sleep spindles (12 to 14 Hz), maximal frontocentral region. EEG showed intermittent bitemporal 3 to 6 Hz theta-delta slowing. Sharp transients were noted in left frontotemporal region. Hyperventilation and photic stimulation were not performed.   ABNORMALITY - Intermittent slow, bitemporal region IMPRESSION: This study is suggestive of cortical dysfunction arising from bitemporal region, nonspecific etiology. No seizures or definite epileptiform discharges were seen throughout the recording. Priyanka ORonelle Nigh AGNP-C Nurse Practitioner 02/14/2021 3:08 PM   [facility] [id number], [place] [id number]: [phone]   F: [phone]

## 2021-02-14 NOTE — Consult Note (Addendum)
[facility] Tele[facility]urology Consult Services   Date of Service:   02/14/2021 01:04:47  Diagnosis:       G45.9 - Transient cerebral ischemic attack, unspecified  Impression:      75 yr old female with significant PMHx of HTN, HLD, was at home in her usual state of health at 22:30 had sudden left F/A/L numbness associated with dizziness and lightheadedness, this lasted <45secs. she reports this same event occurred 3 more times and completely resolved. on neuro eval NIHSS = 0. CT head non acute but shows left convexity meningioma ... no iv thrombolytics (sxs resolved, low NIHSS). Chief differential include TIA but also consider focal seizure. Advise admit for MRI brain w/w/o contrast, MRA head and neck, 2d echo, continue ASA , pending her imaging studies consider EEG, neurology follow-up  Metrics: Last Known Well: 02/13/2021 22:30:00 [facility] Notification Time: 02/14/2021 01:04:47 Arrival Time: 02/14/2021 00:01:00 Stamp Time: 02/14/2021 01:04:47 Initial Response Time: 02/14/2021 01:06:12 Symptoms: left side numbness and weakness. NIHSS Start Assessment Time: 02/14/2021 01:21:44 Patient is not a candidate for Thrombolytic. Thrombolytic Medical Decision: 02/14/2021 01:21:11 Patient was not deemed candidate for Thrombolytic because of following reasons: Resolved symptoms (no residual disabling symptoms).  CT head was reviewed and results were: I personally Reviewed the CT Head and it Showed No acute intracranial abnormality, meningioma at the left convexity  ED Physician notified of diagnostic impression and management plan on 02/14/2021 01:52:38  Advanced Imaging: Advanced Imaging Not Recommended because: low suspicion for LVO   Our recommendations are outlined below.  Recommendations:        Stroke/Telemetry Floor       Neuro Checks       Bedside Swallow Eval       DVT Prophylaxis       IV Fluids, Normal Saline       Head of Bed 30 Degrees        Euglycemia and Avoid Hyperthermia (PRN Acetaminophen)       Initiate or continue Aspirin 81 MG daily       Antihypertensives PRN if Blood pressure is greater than 220/120 or there is a concern for End organ damage/contraindications for permissive HTN. If blood pressure is greater than 220/120 give labetalol PO or IV or Vasotec IV with a goal of 15% reduction in BP during the first 24 hours.  Routine Consultation with [facility] for Follow up Care  Sign Out:       Discussed with Emergency Department Provider    ------------------------------------------------------------------------------  History of Present Illness: Patient is a 75 year old Female.  Patient was brought by private transportation with symptoms of left side numbness and weakness.  75 yr old female with significant PMHx of HTN, HLD, was at home in her ususal stste of healt when while she was doing dished rpeorts sudden left F/A/L numbness associated with dizziness and lightheadedness, this lasted <45secs. she reports this same event occurred 3 more times and completely resolves. she had an episode of pressure headache on the right of her head 4 days ago. no prior hx of TIA, stroke, or seizure  Last seen normal was within 4.5 hours.  Past Medical History:      Hypertension      There is NO history of Diabetes Mellitus      There is NO history of Hyperlipidemia      There is NO history of Atrial Fibrillation      There is NO history of Coronary Artery Disease  There is NO history of Stroke      There is NO history of Covid-19  Social History: Smoking: No Alcohol Use: Former Drug Use: No  Family History:      no cva  Review of System:  14 Points Review of Systems was performed and was negative except mentioned in HPI.  Anticoagulant use:  No  Antiplatelet use: Yes ASA  Allergies:  Reviewed    Examination: BP(178/90), Pulse(99), Blood Glucose(127) 1A: Level of Consciousness - Alert; keenly  responsive + 0 1B: Ask Month and Age - Both Questions Right + 0 1C: Blink Eyes & Squeeze Hands - Performs Both Tasks + 0 2: Test Horizontal Extraocular Movements - Normal + 0 3: Test Visual Fields - No Visual Loss + 0 4: Test Facial Palsy (Use Grimace if Obtunded) - Normal symmetry + 0 5A: Test Left Arm Motor Drift - No Drift for 10 Seconds + 0 5B: Test Right Arm Motor Drift - No Drift for 10 Seconds + 0 6A: Test Left Leg Motor Drift - No Drift for 5 Seconds + 0 6B: Test Right Leg Motor Drift - No Drift for 5 Seconds + 0 7: Test Limb Ataxia (FNF/Heel-Shin) - No Ataxia + 0 8: Test Sensation - Normal; No sensory loss + 0 9: Test Language/Aphasia - Normal; No aphasia + 0 10: Test Dysarthria - Normal + 0 11: Test Extinction/Inattention - No abnormality + 0  NIHSS Score: 0   Pre-Morbid Modified Rankin Scale: 0 Points = No symptoms at all   Patient/Family was informed the Neurology Consult would occur via TeleHealth consult by way of interactive audio and video telecommunications and consented to receiving care in this manner.   Patient is being evaluated for possible acute neurologic impairment and high probability of imminent or life-threatening deterioration. I spent total of 30 minutes providing care to this patient, including time for face to face visit via telemedicine, review of medical records, imaging studies and discussion of findings with providers, the patient and/or family.   Dr Annabelle Harman   [facility] [phone]  Case [id number]

## 2021-02-14 NOTE — Evaluation (Signed)
Physical Therapy Evaluation Patient Details Name: Dorothy Powers MRN: [id number] DOB: [birth year] Today's Date: 02/14/2021   History of Present Illness  Dorothy Powers is a 75 y.o. female with medical history significant for hypertension who presents to the emergency department due to left side of face, arm and leg numbness and tingling sensation with lightheadedness and dizziness which started around 10:30 PM last night, this lasted less than 1 minute.  She states that the episode re-occurred 3 more times.  She decided to go to ED for further evaluation.  Symptoms have since resolved since arrival to the ED except for left leg weakness with some awareness of abnormal gait when ambulating.  She denies slurred speech, facial droop, visual changes, chest pain, shortness of breath or difficulty in speaking or swallowing.   Clinical Impression  Patient functioning near baseline for functional mobility and gait demonstrating good return for ambulation on level, inclined, declined surfaces and on stairs without loss of balance.  Plan:  Patient discharged from physical therapy to care of nursing for ambulation daily as tolerated for length of stay.      Follow Up Recommendations No PT follow up    Equipment Recommendations  None recommended by PT    Recommendations for Other Services       Precautions / Restrictions Precautions Precautions: None Restrictions Weight Bearing Restrictions: No      Mobility  Bed Mobility Overal bed mobility: Independent                  Transfers Overall transfer level: Independent                  Ambulation/Gait Ambulation/Gait assistance: Modified independent (Device/Increase time) Gait Distance (Feet): 200 Feet Assistive device: None Gait Pattern/deviations: WFL(Within Functional Limits) Gait velocity: slightly decreased   General Gait Details: demonstrates good return for ambulation on level, inclined and declined surfaces  without loss of balance  Stairs Stairs: Yes Stairs assistance: Modified independent (Device/Increase time) Stair Management: One rail Right;One rail Left;Alternating pattern Number of Stairs: 10 General stair comments: demonstrating good return for going up/down down stairs using 1 siderail without loss of balance  Wheelchair Mobility    Modified Rankin (Stroke Patients Only)       Balance Overall balance assessment: No apparent balance deficits (not formally assessed)                                           Pertinent Vitals/Pain Pain Assessment: No/denies pain    Home Living Family/patient expects to be discharged to:: Private residence Living Arrangements: Spouse/significant other Available Help at Discharge: Family Type of Home: House Home Access: Stairs to enter Entrance Stairs-Rails: Left Entrance Stairs-Number of Steps: 2 Home Layout: One level Home Equipment: Shower seat - built in      Prior Function Level of Independence: Independent         Comments: community ambulator, drives     Journalist, newspaper        Extremity/Trunk Assessment   Upper Extremity Assessment Upper Extremity Assessment: Defer to OT evaluation    Lower Extremity Assessment Lower Extremity Assessment: Overall WFL for tasks assessed    Cervical / Trunk Assessment Cervical / Trunk Assessment: Normal  Communication   Communication: No difficulties  Cognition Arousal/Alertness: Awake/alert Behavior During Therapy: WFL for tasks assessed/performed Overall Cognitive Status: Within Functional Limits for tasks assessed  General Comments      Exercises     Assessment/Plan    PT Assessment Patent does not need any further PT services  PT Problem List         PT Treatment Interventions      PT Goals (Current goals can be found in the Care Plan section)  Acute Rehab PT Goals Patient Stated Goal:  return home with family to assist PT Goal Formulation: With patient/family Time For Goal Achievement: 02/14/21 Potential to Achieve Goals: Good    Frequency     Barriers to discharge        Co-evaluation               AM-PAC PT "6 Clicks" Mobility  Outcome Measure Help needed turning from your back to your side while in a flat bed without using bedrails?: None Help needed moving from lying on your back to sitting on the side of a flat bed without using bedrails?: None Help needed moving to and from a bed to a chair (including a wheelchair)?: None Help needed standing up from a chair using your arms (e.g., wheelchair or bedside chair)?: None Help needed to walk in hospital room?: None Help needed climbing 3-5 steps with a railing? : None 6 Click Score: 24    End of Session   Activity Tolerance: Patient tolerated treatment well Patient left: in bed;with call bell/phone within reach;with family/visitor present Nurse Communication: Mobility status PT Visit Diagnosis: Unsteadiness on feet (R26.81);Other abnormalities of gait and mobility (R26.89);Muscle weakness (generalized) (M62.81)    Time: [id number] PT Time Calculation (min) (ACUTE ONLY): 14 min   Charges:   PT Evaluation $PT Eval Low Complexity: 1 Low PT Treatments $Gait Training: 8-22 mins        2:39 PM, 02/14/21 Lonell Grandchild, MPT Physical Therapist with [facility] 336 [phone] office [phone] mobile phone

## 2021-02-14 NOTE — ED Triage Notes (Signed)
Pt states she has had 4 episodes of dizziness accompanied by numbness and tingling to L side. States it has resolved each time.

## 2021-02-14 NOTE — Progress Notes (Signed)
OT Cancellation Note  Patient Details Name: Dorothy Powers MRN: [id number] DOB: 01/02/1946   Cancelled Treatment:    Reason Eval/Treat Not Completed: Patient at procedure or test/ unavailable. Attempted to completed OT evaluation. Pt out of room for testing. Will re-attempt OT evaluation at a later time when able.    Ailene Ravel, OTR/L,CBIS  [phone]  02/14/2021, 12:21 PM

## 2021-02-14 NOTE — Progress Notes (Signed)
EEG completed, results pending. 

## 2021-02-14 NOTE — H&P (Signed)
History and Physical  Dorothy Powers [id number] DOB: 17-Jan-1946 DOA: 02/14/2021  Referring physician: Ezequiel Essex, MD PCP: Orpah Melter, MD  Patient coming from: Home  Chief Complaint: Numbness and tingling, dizziness  HPI: Dorothy Powers is a 75 y.o. female with medical history significant for hypertension who presents to the emergency department due to left side of face, arm and leg numbness and tingling sensation with lightheadedness and dizziness which started around 10:30 PM last night, this lasted less than 1 minute.  She states that the episode re-occurred 3 more times.  She decided to go to ED for further evaluation.  Symptoms have since resolved since arrival to the ED except for left leg weakness with some awareness of abnormal gait when ambulating.  She denies slurred speech, facial droop, visual changes, chest pain, shortness of breath or difficulty in speaking or swallowing.  ED Course:  In the emergency department, she was hemodynamically stable, initial BP was elevated at 185/83.  But other vital signs are within normal range.  Work-up in the ED showed normal CBC, hypokalemia, hyperglycemia, urine drug screen was negative, magnesium and phosphorus were within normal range, urinalysis was unimpressive for UTI.  Influenza A, B, SARS coronavirus 2 was negative. CT of head without contrast showed no acute intracranial abnormality. Calcified meningioma at the anterior left convexity without associated edema within the underlying brain. Aspirin 324 mg x 1 was given, teleneurology was consulted and recommended further stroke work-up.  Hospitalist was asked to admit patient for further evaluation and management.   Review of Systems: Constitutional: Negative for chills and fever.  HENT: Negative for ear pain and sore throat.   Eyes: Negative for pain and visual disturbance.  Respiratory: Negative for cough, chest tightness and shortness of breath.   Cardiovascular:  Negative for chest pain and palpitations.  Gastrointestinal: Negative for abdominal pain and vomiting.  Endocrine: Negative for polyphagia and polyuria.  Genitourinary: Negative for decreased urine volume, dysuria, enuresis Musculoskeletal: Negative for arthralgias and back pain.  Skin: Negative for color change and rash.  Allergic/Immunologic: Negative for immunocompromised state.  Neurological: Positive for left-sided numbness, tingling.  Positive for weakness and dizziness.  Negative for tremors, syncope, speech difficulty Hematological: Does not bruise/bleed easily.  All other systems reviewed and are negative   Past Medical History:  Diagnosis Date   Hypertension    Tachycardia    Past Surgical History:  Procedure Laterality Date   ABDOMINAL HYSTERECTOMY      Social History:  reports that she has never smoked. She has never used smokeless tobacco. She reports current alcohol use. No history on file for drug use.   Allergies  Allergen Reactions   Codeine    Lovastatin     Other reaction(s): leg pain   Sulfa Antibiotics     History reviewed. No pertinent family history.    Prior to Admission medications   Medication Sig Start Date End Date Taking? Authorizing Provider  amLODipine (NORVASC) 5 MG tablet Take 1 tablet by mouth daily. 11/29/20   [provider]  Ascorbic Acid (VITAMIN C PO) Take by mouth.    [provider]  aspirin EC 81 MG tablet Take 81 mg by mouth daily. Swallow whole.    [provider]  atenolol (TENORMIN) 50 MG tablet Take 50 mg by mouth 2 (two) times daily. 09/02/20   [provider]  azelastine (ASTELIN) 0.1 % nasal spray USE 1 SPRAY IN EACH NOSTRIL TWICE DAILY AS NEEDED NASALLY. 08/02/20  [provider]  calcium carbonate (OSCAL) 1500 (600 Ca) MG TABS tablet Take by mouth 2 (two) times daily with a meal.    [provider]  ezetimibe (ZETIA) 10 MG tablet Take 10 mg by mouth daily. 06/27/20    [provider]  fluticasone (FLONASE) 50 MCG/ACT nasal spray Place into both nostrils daily.    [provider]  glucosamine-chondroitin 500-400 MG tablet Take 1 tablet by mouth 3 (three) times daily.    [provider]  lisinopril (ZESTRIL) 20 MG tablet Take 20 mg by mouth daily. 08/22/20   [provider]  loratadine (CLARITIN) 10 MG tablet Take 10 mg by mouth daily.    [provider]  Naproxen Sodium (ALEVE PO) Take by mouth.    [provider]  ofloxacin (OCUFLOX) 0.3 % ophthalmic solution PLACE ONE DROP INTO OPERATIVE EYE FOUR TIMES A DAY, BEGINNING ONE DAY PRIOR TO SURGERY 10/12/20   [provider]  Omega-3 Fatty Acids (FISH OIL PO) Take by mouth.    [provider]  pantoprazole (PROTONIX) 40 MG tablet Take 40 mg by mouth daily. 09/20/20   [provider]  prednisoLONE acetate (PRED FORTE) 1 % ophthalmic suspension PLACE ONE DROP INTO OPERATIVE EYE FOUR TIMES A DAY, BEGINNING AFTER SURGERY 10/12/20   [provider]  PROLENSA 0.07 % SOLN PLACE ONE DROP INTO OPERATIVE EYE ONCE DAILY, BEGINNING ONE DAY PRIOR TO SURGERY 10/12/20   [provider]    Physical Exam: BP (!) 142/75 (BP Location: Left Arm)   Pulse 77   Temp 98 F (36.7 C) (Oral)   Resp 15   Ht '5\' 3"'$  (1.6 m)   Wt 96.2 kg   SpO2 96%   BMI 37.55 kg/m   General: 75 y.o. year-old female well developed well nourished in no acute distress.  Alert and oriented x3. HEENT: NCAT, EOMI Neck: Supple, trachea medial Cardiovascular: Regular rate and rhythm with no rubs or gallops.  No thyromegaly or JVD noted.  No lower extremity edema. 2/4 pulses in all 4 extremities. Respiratory: Clear to auscultation with no wheezes or rales. Good inspiratory effort. Abdomen: Soft, nontender nondistended with normal bowel sounds x4 quadrants. Muskuloskeletal: No cyanosis, clubbing or edema noted bilaterally Neuro: CN II-XII intact, strength 5/5 x 4,  sensation, reflexes intact Skin: No ulcerative lesions noted or rashes Psychiatry: Judgement and insight appear normal. Mood is appropriate for condition and setting          Labs on Admission:  Basic Metabolic Panel: Recent Labs  Lab 02/14/21 0100 02/14/21 0102 02/14/21 0553  NA 137 140 140  K 3.4* 3.6 4.0  CL 102 102 104  CO2 29  --  27  GLUCOSE 125* 127* 111*  BUN '16 16 15  '$ CREATININE 0.56 0.60 0.55  CALCIUM 9.1  --  9.1  MG  --   --  2.1  PHOS  --   --  4.4   Liver Function Tests: Recent Labs  Lab 02/14/21 0100 02/14/21 0553  AST 22 19  ALT 20 18  ALKPHOS 116 94  BILITOT 0.5 0.6  PROT 7.9 6.8  ALBUMIN 4.2 3.7   No results for input(s): LIPASE, AMYLASE in the last 168 hours. No results for input(s): AMMONIA in the last 168 hours. CBC: Recent Labs  Lab 02/14/21 0100 02/14/21 0102 02/14/21 0553  WBC 8.2  --  6.4  NEUTROABS 5.4  --   --   HGB 14.2 14.6 13.4  HCT 43.5  43.0 40.5  MCV 89.9  --  89.0  PLT 314  --  279   Cardiac Enzymes: No results for input(s): CKTOTAL, CKMB, CKMBINDEX, TROPONINI in the last 168 hours.  BNP (last 3 results) No results for input(s): BNP in the last 8760 hours.  ProBNP (last 3 results) No results for input(s): PROBNP in the last 8760 hours.  CBG: No results for input(s): GLUCAP in the last 168 hours.  Radiological Exams on Admission: CT HEAD CODE STROKE WO CONTRAST  Result Date: 02/14/2021 CLINICAL DATA:  Code stroke.  Dizziness with left-sided numbness EXAM: CT HEAD WITHOUT CONTRAST TECHNIQUE: Contiguous axial images were obtained from the base of the skull through the vertex without intravenous contrast. COMPARISON:  None. FINDINGS: Brain: Calcified meningioma at the anterior left convexity. No associated edema within the underlying brain. No acute hemorrhage. The size and configuration of the ventricles and extra-axial CSF spaces are normal. The brain parenchyma is normal, without evidence of acute or chronic infarction.  Vascular: No abnormal hyperdensity of the major intracranial arteries or dural venous sinuses. No intracranial atherosclerosis. Skull: The visualized skull base, calvarium and extracranial soft tissues are normal. Sinuses/Orbits: No fluid levels or advanced mucosal thickening of the visualized paranasal sinuses. No mastoid or middle ear effusion. The orbits are normal. ASPECTS [facility] Stroke Program Early CT Score) - Ganglionic level infarction (caudate, lentiform nuclei, internal capsule, insula, M1-M3 cortex): 7 - Supraganglionic infarction (M4-M6 cortex): 3 Total score (0-10 with 10 being normal): 10 IMPRESSION: 1. No acute intracranial abnormality. 2. ASPECTS is 10. 3. Calcified meningioma at the anterior left convexity without associated edema within the underlying brain. These results were called by telephone at the time of interpretation on 02/14/2021 at 1:10 am to provider [facility] , who verbally acknowledged these results. Electronically Signed   By: Ulyses Jarred M.D.   On: 02/14/2021 01:10    EKG: I independently viewed the EKG done and my findings are as followed: Normal sinus rhythm at a rate of 86 bpm  Assessment/Plan Present on Admission:  Transient ischemic attack  Essential hypertension  Active Problems:   Essential hypertension   Transient ischemic attack   Hypokalemia   Hyperglycemia  Transient ischemic attack Patient will be admitted to telemetry unit  Echocardiogram in the morning MRI of brain with and without contrast in the morning MRA head and neck will be done in the morning EEG will be done in the morning Continue aspirin 81 mg p.o. daily Continue fall precautions and neuro checks Lipid panel and hemoglobin A1c will be checked Continue PT/OT eval and treat Bedside swallow eval by nursing prior to diet Neurologist will be consulted and we shall await further recommendation.  Hypokalemia K+ 3.4, this will be replenished  Hyperglycemia possibly  reactive CBG 125, continue to monitor blood glucose level Hemoglobin A1c pending  Essential hypertension Permissive hypertension will be allowed at this time  DVT prophylaxis: SCDs  Code Status: Full code  Family Communication: None at bedside  Disposition Plan:  Patient is from:                        home Anticipated DC to:                   SNF or family members home Anticipated DC date:               2-3 days Anticipated DC barriers:  Patient requires inpatient management for further stroke work-up  Consults called: Neurology  Admission status: Observation    Bernadette Hoit MD [facility]  02/14/2021, 6:44 AM

## 2021-02-15 ENCOUNTER — Observation Stay (HOSPITAL_BASED_OUTPATIENT_CLINIC_OR_DEPARTMENT_OTHER): Payer: Medicare Other

## 2021-02-15 DIAGNOSIS — I10 Essential (primary) hypertension: Secondary | ICD-10-CM | POA: Diagnosis not present

## 2021-02-15 DIAGNOSIS — G459 Transient cerebral ischemic attack, unspecified: Secondary | ICD-10-CM | POA: Diagnosis not present

## 2021-02-15 LAB — ECHOCARDIOGRAM COMPLETE
AR max vel: 2.33 cm2
AV Area VTI: 2.25 cm2
AV Area mean vel: 2.32 cm2
AV Mean grad: 5 mmHg
AV Peak grad: 9.4 mmHg
Ao pk vel: 1.53 m/s
Area-P 1/2: 3.26 cm2
Height: 63 in
MV VTI: 3.21 cm2
S' Lateral: 2.97 cm
Weight: 3392 oz

## 2021-02-15 MED ORDER — CLOPIDOGREL BISULFATE 75 MG PO TABS: 75.0000 mg | ORAL_TABLET | Freq: Every day | ORAL | 11 refills | Status: DC

## 2021-02-15 MED ORDER — ASPIRIN EC 81 MG PO TBEC: 81.0000 mg | DELAYED_RELEASE_TABLET | Freq: Every day | ORAL | 2 refills | Status: AC

## 2021-02-15 NOTE — Progress Notes (Signed)
  Echocardiogram 2D Echocardiogram has been performed.  Elpidio Anis 02/15/2021, 9:46 AM

## 2021-02-15 NOTE — Discharge Summary (Signed)
[facility]  Physician Discharge Summary   Patient ID: Dorothy Powers MRN: [id number] DOB/AGE: 1946-02-05 75 y.o.  Admit date: 02/14/2021 Discharge date: 02/15/2021    PCP: Orpah Melter, MD  DISCHARGE DIAGNOSES:  TIA Meningioma Intracranial vascular stenoses Hyperlipidemia Essential hypertension  RECOMMENDATIONS FOR OUTPATIENT FOLLOW UP: Ambulatory referral sent to neurology for follow-up Patient may benefit from alternative treatments for elevated LDL.  Consider referral to lipid clinic.    Home Health: None Equipment/Devices: None  CODE STATUS: Full code  DISCHARGE CONDITION: fair  Diet recommendation: Heart healthy  INITIAL HISTORY: 75 y.o. female with medical history significant for hypertension who presents to the emergency department due to left side of face, arm and leg numbness and tingling sensation with lightheadedness and dizziness which started around 10:30 PM last night, this lasted less than 1 minute.  She states that the episode re-occurred 3 more times.  She decided to go to ED for further evaluation.  Symptoms have since resolved since arrival to the ED except for left leg weakness with some awareness of abnormal gait when ambulating.  She denies slurred speech, facial droop, visual changes, chest pain, shortness of breath or difficulty in speaking or swallowing.  Consultations: Phone discussion with neurology  Procedures: Transthoracic echocardiogram (see report below)  EEG was also done which did not show any epileptiform activity.  HOSPITAL COURSE:   TIA Patient presented with left-sided paresthesias.  Symptoms subsequently resolved.  MRI did not show any acute stroke.  MRA brain however showed significant vascular disease in the intracranial vessels.  No significant stenosis noted in the carotid vessels.  Patient has been taking aspirin daily previously.  Discussed with neurology who recommended aspirin and Plavix for 3 months followed  by Plavix alone.  Patient also noted to have LDL of 125.  She has reported intolerance to multiple statins previously.  Currently on Zetia.  She was told that her LDL needs to be better controlled.  She needs to discuss this further with her PCP to see if she would be a candidate for newer agents.  She may also benefit from being referred to the lipid clinic.  HbA1c 6.0.  Echocardiogram showed normal systolic function.  No significant valvular abnormalities were noted.  Seen by PT and OT.  No needs identified.  Meningioma This was incidentally noted in the MRI brain.  This was located in the left side.  Unlikely to be contributing to her current symptoms.  Findings were discussed with neurosurgery.  No indication for intervention currently but they would like to see the patient in their office in the future.  This was communicated to the patient.  Essential hypertension May resume home medications.  Obesity Estimated body mass index is 37.55 kg/m as calculated from the following:   Height as of this encounter: '5\' 3"'$  (1.6 m).   Weight as of this encounter: 96.2 kg.  Patient is stable.  Symptoms have resolved.  Work-up as discussed above.  Okay for discharge home today.  PERTINENT LABS:  The results of significant diagnostics from this hospitalization (including imaging, microbiology, ancillary and laboratory) are listed below for reference.    Microbiology: Recent Results (from the past 240 hour(s))  Resp Panel by RT-PCR (Flu A&B, Covid) Nasopharyngeal Swab     Status: None   Collection Time: 02/14/21  1:20 AM   Specimen: Nasopharyngeal Swab; Nasopharyngeal(NP) swabs in vial transport medium  Result Value Ref Range Status   SARS Coronavirus 2 by RT PCR NEGATIVE NEGATIVE Final  Comment: (NOTE) SARS-CoV-2 target nucleic acids are NOT DETECTED.  The SARS-CoV-2 RNA is generally detectable in upper respiratory specimens during the acute phase of infection. The lowest concentration of  SARS-CoV-2 viral copies this assay can detect is 138 copies/mL. A negative result does not preclude SARS-Cov-2 infection and should not be used as the sole basis for treatment or other patient management decisions. A negative result may occur with  improper specimen collection/handling, submission of specimen other than nasopharyngeal swab, presence of viral mutation(s) within the areas targeted by this assay, and inadequate number of viral copies(<138 copies/mL). A negative result must be combined with clinical observations, patient history, and epidemiological information. The expected result is Negative.  Fact Sheet for Patients:  EntrepreneurPulse.com.au  Fact Sheet for Healthcare Providers:  IncredibleEmployment.be  This test is no t yet approved or cleared by the [place] FDA and  has been authorized for detection and/or diagnosis of SARS-CoV-2 by FDA under an Emergency Use Authorization (EUA). This EUA will remain  in effect (meaning this test can be used) for the duration of the COVID-19 declaration under Section 564(b)(1) of the Act, 21 U.S.C.section 360bbb-3(b)(1), unless the authorization is terminated  or revoked sooner.       Influenza A by PCR NEGATIVE NEGATIVE Final   Influenza B by PCR NEGATIVE NEGATIVE Final    Comment: (NOTE) The Xpert Xpress SARS-CoV-2/FLU/RSV plus assay is intended as an aid in the diagnosis of influenza from Nasopharyngeal swab specimens and should not be used as a sole basis for treatment. Nasal washings and aspirates are unacceptable for Xpert Xpress SARS-CoV-2/FLU/RSV testing.  Fact Sheet for Patients: EntrepreneurPulse.com.au  Fact Sheet for Healthcare Providers: IncredibleEmployment.be  This test is not yet approved or cleared by the [place] FDA and has been authorized for detection and/or diagnosis of SARS-CoV-2 by FDA under an Emergency Use  Authorization (EUA). This EUA will remain in effect (meaning this test can be used) for the duration of the COVID-19 declaration under Section 564(b)(1) of the Act, 21 U.S.C. section 360bbb-3(b)(1), unless the authorization is terminated or revoked.  Performed at [facility], [address], [place], [place] [address]      Labs:  COVID-19 Labs    Lab Results  Component Value Date   Kemp 02/14/2021      Basic Metabolic Panel: Recent Labs  Lab 02/14/21 0100 02/14/21 0102 02/14/21 0553  NA 137 140 140  K 3.4* 3.6 4.0  CL 102 102 104  CO2 29  --  27  GLUCOSE 125* 127* 111*  BUN '16 16 15  '$ CREATININE 0.56 0.60 0.55  CALCIUM 9.1  --  9.1  MG  --   --  2.1  PHOS  --   --  4.4   Liver Function Tests: Recent Labs  Lab 02/14/21 0100 02/14/21 0553  AST 22 19  ALT 20 18  ALKPHOS 116 94  BILITOT 0.5 0.6  PROT 7.9 6.8  ALBUMIN 4.2 3.7    CBC: Recent Labs  Lab 02/14/21 0100 02/14/21 0102 02/14/21 0553  WBC 8.2  --  6.4  NEUTROABS 5.4  --   --   HGB 14.2 14.6 13.4  HCT 43.5 43.0 40.5  MCV 89.9  --  89.0  PLT 314  --  279     IMAGING STUDIES MR ANGIO HEAD WO CONTRAST  Result Date: 02/14/2021 CLINICAL DATA:  Neuro deficit, acute, stroke suspected. Additional provided: Dizziness with left-sided numbness. EXAM: MRI HEAD WITHOUT AND WITH CONTRAST MRA  HEAD WITHOUT CONTRAST MRA NECK WITHOUT AND WITH CONTRAST TECHNIQUE: Multiplanar, multi-echo pulse sequences of the brain and surrounding structures were acquired without and with intravenous contrast. Angiographic images of the Circle of Willis were acquired using MRA technique without intravenous contrast. Angiographic images of the neck were acquired using MRA technique without and with intravenous contrast. Carotid stenosis measurements (when applicable) are obtained utilizing NASCET criteria, using the distal internal carotid diameter as the denominator. CONTRAST:  69m GADAVIST GADOBUTROL 1 MMOL/ML IV  SOLN COMPARISON:  Noncontrast head CT performed earlier today 02/14/2021. FINDINGS: MRI HEAD FINDINGS Brain: Mild generalized cerebral atrophy. Calcified and avidly enhancing dural-based mass compatible with meningioma overlying the lateral left frontal lobe, measuring 3.7 x 2.9 x 3.8 cm (AP x TV x CC). Mass effect upon the underlying left frontal lobe without significant underlying parenchymal edema. No midline shift. No cortical encephalomalacia is identified. No significant cerebral white matter disease. There is no acute infarct. No chronic intracranial blood products. No extra-axial fluid collection. No midline shift. Vascular: Expected proximal arterial flow voids. Skull and upper cervical spine: Nonspecific T1 hypointense, T2/FLAIR hyperintense and enhancing lesion within the right parietal calvarium. Incompletely assessed cervical spondylosis. C3-C4 and C4-C5 grade 1 anterolisthesis. Sinuses/Orbits: Visualized orbits show no acute finding. No significant paranasal sinus disease at the imaged levels. MRA HEAD FINDINGS Anterior circulation: The intracranial internal carotid arteries are patent. Mild to moderate stenosis within the pre cavernous/proximal cavernous right ICA. The M1 middle cerebral arteries are patent. No M2 proximal branch occlusion or high-grade proximal stenosis is identified. The anterior cerebral arteries are patent. 1-2 mm inflate projecting vascular protrusion arising from the cavernous right ICA, likely reflecting an aneurysm. 1-2 mm inflate projecting vascular protrusion arising from the paraclinoid right ICA, which may reflect an aneurysm or infundibulum. 1-2 mm inferiorly projecting vascular protrusion arising from the paraclinoid left ICA, which may reflect an aneurysm or infundibulum. Posterior circulation: The intracranial vertebral arteries are patent. The basilar artery is patent. The posterior cerebral arteries are patent. Atherosclerotic irregularity of both vessels. Most  notably, there is a severe stenosis within the P4 right posterior cerebral artery and a severe stenosis within the left posterior cerebral artery at the P3/P4 junction. Posterior communicating arteries are hypoplastic or absent bilaterally. Anatomic variants: As described. MRA NECK FINDINGS Aortic arch: Standard aortic branching. The visualized aortic arch is normal in caliber. No appreciable hemodynamically significant innominate or proximal subclavian artery stenosis. Right carotid system: CCA and ICA patent within the neck without hemodynamically significant stenosis (50% or greater). Mild atherosclerotic plaque about the carotid bifurcation. Left carotid system: CCA and ICA patent within the neck without hemodynamically significant stenosis (50% or greater). Vertebral arteries: Vertebral arteries codominant and patent within the neck with antegrade flow. Apparent moderate stenosis at the origin of the left vertebral artery. IMPRESSION: MRI brain: 1. No evidence of acute infarction. 2. Redemonstrated 3.7 x 3.8 cm calcified meningioma overlying the lateral left frontal lobe. Mass effect upon the underlying left frontal lobe without significant underlying parenchymal edema. No midline shift. 3. Mild generalized cerebral atrophy. 4. Indeterminate 5 mm enhancing lesion within the right parietal calvarium. 485-monthRI follow-up is recommended to ensure stability and exclude worrisome etiologies (such as osseous metastatic disease). 5. Incompletely assessed cervical spondylosis. 6. C3-C4 and C4-C5 grade 1 anterolisthesis. MRA head: 1. No intracranial large vessel occlusion. 2. Intracranial atherosclerotic disease with multifocal stenoses, most notably as follows. 3. Mild-to-moderate stenosis within the pre-cavernous/cavernous right ICA. 4. Severe stenosis within the left posterior  cerebral artery at the P3/P4 junction. 5. Severe stenosis within the P4 segment of the right posterior cerebral artery. 6. 1-2 mm inferiorly  projecting vascular protrusion arising from the cavernous right ICA, likely reflecting an aneurysm. 7. 1-2 mm inferiorly projecting vascular protrusions arising from the paraclinoid internal carotid arteries bilaterally, which may reflect aneurysms or infundibula. MRA neck: 1. Common carotid and internal carotid arteries patent within the neck without hemodynamically significant stenosis. Mild atherosclerotic plaque about the right carotid bifurcation. 2. Vertebral arteries patent within the neck with antegrade flow. Apparent moderate stenosis at the origin of the left vertebral artery. Electronically Signed   By: Kellie Simmering DO   On: 02/14/2021 13:19   MR ANGIO NECK W WO CONTRAST  Result Date: 02/14/2021 CLINICAL DATA:  Neuro deficit, acute, stroke suspected. Additional provided: Dizziness with left-sided numbness. EXAM: MRI HEAD WITHOUT AND WITH CONTRAST MRA HEAD WITHOUT CONTRAST MRA NECK WITHOUT AND WITH CONTRAST TECHNIQUE: Multiplanar, multi-echo pulse sequences of the brain and surrounding structures were acquired without and with intravenous contrast. Angiographic images of the Circle of Willis were acquired using MRA technique without intravenous contrast. Angiographic images of the neck were acquired using MRA technique without and with intravenous contrast. Carotid stenosis measurements (when applicable) are obtained utilizing NASCET criteria, using the distal internal carotid diameter as the denominator. CONTRAST:  75m GADAVIST GADOBUTROL 1 MMOL/ML IV SOLN COMPARISON:  Noncontrast head CT performed earlier today 02/14/2021. FINDINGS: MRI HEAD FINDINGS Brain: Mild generalized cerebral atrophy. Calcified and avidly enhancing dural-based mass compatible with meningioma overlying the lateral left frontal lobe, measuring 3.7 x 2.9 x 3.8 cm (AP x TV x CC). Mass effect upon the underlying left frontal lobe without significant underlying parenchymal edema. No midline shift. No cortical encephalomalacia is  identified. No significant cerebral white matter disease. There is no acute infarct. No chronic intracranial blood products. No extra-axial fluid collection. No midline shift. Vascular: Expected proximal arterial flow voids. Skull and upper cervical spine: Nonspecific T1 hypointense, T2/FLAIR hyperintense and enhancing lesion within the right parietal calvarium. Incompletely assessed cervical spondylosis. C3-C4 and C4-C5 grade 1 anterolisthesis. Sinuses/Orbits: Visualized orbits show no acute finding. No significant paranasal sinus disease at the imaged levels. MRA HEAD FINDINGS Anterior circulation: The intracranial internal carotid arteries are patent. Mild to moderate stenosis within the pre cavernous/proximal cavernous right ICA. The M1 middle cerebral arteries are patent. No M2 proximal branch occlusion or high-grade proximal stenosis is identified. The anterior cerebral arteries are patent. 1-2 mm inflate projecting vascular protrusion arising from the cavernous right ICA, likely reflecting an aneurysm. 1-2 mm inflate projecting vascular protrusion arising from the paraclinoid right ICA, which may reflect an aneurysm or infundibulum. 1-2 mm inferiorly projecting vascular protrusion arising from the paraclinoid left ICA, which may reflect an aneurysm or infundibulum. Posterior circulation: The intracranial vertebral arteries are patent. The basilar artery is patent. The posterior cerebral arteries are patent. Atherosclerotic irregularity of both vessels. Most notably, there is a severe stenosis within the P4 right posterior cerebral artery and a severe stenosis within the left posterior cerebral artery at the P3/P4 junction. Posterior communicating arteries are hypoplastic or absent bilaterally. Anatomic variants: As described. MRA NECK FINDINGS Aortic arch: Standard aortic branching. The visualized aortic arch is normal in caliber. No appreciable hemodynamically significant innominate or proximal subclavian  artery stenosis. Right carotid system: CCA and ICA patent within the neck without hemodynamically significant stenosis (50% or greater). Mild atherosclerotic plaque about the carotid bifurcation. Left carotid system: CCA and ICA patent  within the neck without hemodynamically significant stenosis (50% or greater). Vertebral arteries: Vertebral arteries codominant and patent within the neck with antegrade flow. Apparent moderate stenosis at the origin of the left vertebral artery. IMPRESSION: MRI brain: 1. No evidence of acute infarction. 2. Redemonstrated 3.7 x 3.8 cm calcified meningioma overlying the lateral left frontal lobe. Mass effect upon the underlying left frontal lobe without significant underlying parenchymal edema. No midline shift. 3. Mild generalized cerebral atrophy. 4. Indeterminate 5 mm enhancing lesion within the right parietal calvarium. 57-monthMRI follow-up is recommended to ensure stability and exclude worrisome etiologies (such as osseous metastatic disease). 5. Incompletely assessed cervical spondylosis. 6. C3-C4 and C4-C5 grade 1 anterolisthesis. MRA head: 1. No intracranial large vessel occlusion. 2. Intracranial atherosclerotic disease with multifocal stenoses, most notably as follows. 3. Mild-to-moderate stenosis within the pre-cavernous/cavernous right ICA. 4. Severe stenosis within the left posterior cerebral artery at the P3/P4 junction. 5. Severe stenosis within the P4 segment of the right posterior cerebral artery. 6. 1-2 mm inferiorly projecting vascular protrusion arising from the cavernous right ICA, likely reflecting an aneurysm. 7. 1-2 mm inferiorly projecting vascular protrusions arising from the paraclinoid internal carotid arteries bilaterally, which may reflect aneurysms or infundibula. MRA neck: 1. Common carotid and internal carotid arteries patent within the neck without hemodynamically significant stenosis. Mild atherosclerotic plaque about the right carotid bifurcation.  2. Vertebral arteries patent within the neck with antegrade flow. Apparent moderate stenosis at the origin of the left vertebral artery. Electronically Signed   By: KKellie SimmeringDO   On: 02/14/2021 13:19   MR BRAIN W WO CONTRAST  Result Date: 02/14/2021 CLINICAL DATA:  Neuro deficit, acute, stroke suspected. Additional provided: Dizziness with left-sided numbness. EXAM: MRI HEAD WITHOUT AND WITH CONTRAST MRA HEAD WITHOUT CONTRAST MRA NECK WITHOUT AND WITH CONTRAST TECHNIQUE: Multiplanar, multi-echo pulse sequences of the brain and surrounding structures were acquired without and with intravenous contrast. Angiographic images of the Circle of Willis were acquired using MRA technique without intravenous contrast. Angiographic images of the neck were acquired using MRA technique without and with intravenous contrast. Carotid stenosis measurements (when applicable) are obtained utilizing NASCET criteria, using the distal internal carotid diameter as the denominator. CONTRAST:  750mGADAVIST GADOBUTROL 1 MMOL/ML IV SOLN COMPARISON:  Noncontrast head CT performed earlier today 02/14/2021. FINDINGS: MRI HEAD FINDINGS Brain: Mild generalized cerebral atrophy. Calcified and avidly enhancing dural-based mass compatible with meningioma overlying the lateral left frontal lobe, measuring 3.7 x 2.9 x 3.8 cm (AP x TV x CC). Mass effect upon the underlying left frontal lobe without significant underlying parenchymal edema. No midline shift. No cortical encephalomalacia is identified. No significant cerebral white matter disease. There is no acute infarct. No chronic intracranial blood products. No extra-axial fluid collection. No midline shift. Vascular: Expected proximal arterial flow voids. Skull and upper cervical spine: Nonspecific T1 hypointense, T2/FLAIR hyperintense and enhancing lesion within the right parietal calvarium. Incompletely assessed cervical spondylosis. C3-C4 and C4-C5 grade 1 anterolisthesis. Sinuses/Orbits:  Visualized orbits show no acute finding. No significant paranasal sinus disease at the imaged levels. MRA HEAD FINDINGS Anterior circulation: The intracranial internal carotid arteries are patent. Mild to moderate stenosis within the pre cavernous/proximal cavernous right ICA. The M1 middle cerebral arteries are patent. No M2 proximal branch occlusion or high-grade proximal stenosis is identified. The anterior cerebral arteries are patent. 1-2 mm inflate projecting vascular protrusion arising from the cavernous right ICA, likely reflecting an aneurysm. 1-2 mm inflate projecting vascular protrusion arising from the  paraclinoid right ICA, which may reflect an aneurysm or infundibulum. 1-2 mm inferiorly projecting vascular protrusion arising from the paraclinoid left ICA, which may reflect an aneurysm or infundibulum. Posterior circulation: The intracranial vertebral arteries are patent. The basilar artery is patent. The posterior cerebral arteries are patent. Atherosclerotic irregularity of both vessels. Most notably, there is a severe stenosis within the P4 right posterior cerebral artery and a severe stenosis within the left posterior cerebral artery at the P3/P4 junction. Posterior communicating arteries are hypoplastic or absent bilaterally. Anatomic variants: As described. MRA NECK FINDINGS Aortic arch: Standard aortic branching. The visualized aortic arch is normal in caliber. No appreciable hemodynamically significant innominate or proximal subclavian artery stenosis. Right carotid system: CCA and ICA patent within the neck without hemodynamically significant stenosis (50% or greater). Mild atherosclerotic plaque about the carotid bifurcation. Left carotid system: CCA and ICA patent within the neck without hemodynamically significant stenosis (50% or greater). Vertebral arteries: Vertebral arteries codominant and patent within the neck with antegrade flow. Apparent moderate stenosis at the origin of the left  vertebral artery. IMPRESSION: MRI brain: 1. No evidence of acute infarction. 2. Redemonstrated 3.7 x 3.8 cm calcified meningioma overlying the lateral left frontal lobe. Mass effect upon the underlying left frontal lobe without significant underlying parenchymal edema. No midline shift. 3. Mild generalized cerebral atrophy. 4. Indeterminate 5 mm enhancing lesion within the right parietal calvarium. 77-monthMRI follow-up is recommended to ensure stability and exclude worrisome etiologies (such as osseous metastatic disease). 5. Incompletely assessed cervical spondylosis. 6. C3-C4 and C4-C5 grade 1 anterolisthesis. MRA head: 1. No intracranial large vessel occlusion. 2. Intracranial atherosclerotic disease with multifocal stenoses, most notably as follows. 3. Mild-to-moderate stenosis within the pre-cavernous/cavernous right ICA. 4. Severe stenosis within the left posterior cerebral artery at the P3/P4 junction. 5. Severe stenosis within the P4 segment of the right posterior cerebral artery. 6. 1-2 mm inferiorly projecting vascular protrusion arising from the cavernous right ICA, likely reflecting an aneurysm. 7. 1-2 mm inferiorly projecting vascular protrusions arising from the paraclinoid internal carotid arteries bilaterally, which may reflect aneurysms or infundibula. MRA neck: 1. Common carotid and internal carotid arteries patent within the neck without hemodynamically significant stenosis. Mild atherosclerotic plaque about the right carotid bifurcation. 2. Vertebral arteries patent within the neck with antegrade flow. Apparent moderate stenosis at the origin of the left vertebral artery. Electronically Signed   By: KKellie SimmeringDO   On: 02/14/2021 13:19   DG Foot Complete Right  Result Date: 01/18/2021 Please see detailed radiograph report in office note.  EEG adult  Result Date: 02/14/2021 YLora Havens MD     02/14/2021 11:01 AM Patient Name: MLEELANI NEDLEYMRN: [id number] Attending:  PLora HavensReferring Physician/Provider: Dr ABernadette HoitDate: 02/14/2021 Duration: 23.32 mins Patient history: 75yr old female with significant PMHx of HTN, HLD, was at home in her [place] when while she was doing dished rpeorts sudden left F/A/L numbness associated with dizziness and lightheadedness, this lasted <45secs. she reports this same event occurred 3 more times and completely resolves. Eeg to evaluate for seizure Level of alertness: Awake, asleep AEDs during EEG study: Technical aspects: This EEG study was done with scalp electrodes positioned according to the 10-20 International system of electrode placement. Electrical activity was acquired at a sampling rate of '500Hz'$  and reviewed with a high frequency filter of '70Hz'$  and a low frequency filter of '1Hz'$ . EEG data were recorded continuously and digitally stored. Description:  The posterior dominant rhythm consists of 9 Hz activity of moderate voltage (25-35 uV) seen predominantly in posterior head regions, symmetric and reactive to eye opening and eye closing. Sleep was characterized by vertex waves, sleep spindles (12 to 14 Hz), maximal frontocentral region. EEG showed intermittent bitemporal 3 to 6 Hz theta-delta slowing. Sharp transients were noted in left frontotemporal region. Hyperventilation and photic stimulation were not performed.   ABNORMALITY - Intermittent slow, bitemporal region IMPRESSION: This study is suggestive of cortical dysfunction arising from bitemporal region, nonspecific etiology. No seizures or definite epileptiform discharges were seen throughout the recording. Lora Havens   ECHOCARDIOGRAM COMPLETE  Result Date: 02/15/2021    ECHOCARDIOGRAM REPORT   Patient Name:   KAILIYAH RUSK Date of Exam: 02/15/2021 Medical Rec #:  [id number]          Height:       63.0 in Accession #:    [id number]         Weight:       212.0 lb Date of Birth:  1946-02-17          BSA:          1.982 m Patient Age:    53 years            BP:           148/65 mmHg Patient Gender: F                  HR:           72 bpm. Exam Location:  [facility] Procedure: 2D Echo, Cardiac Doppler and Color Doppler Indications:    Stroke  History:        Patient has no prior history of Echocardiogram examinations.                 TIA; Risk Factors:Hypertension.  Sonographer:    Wenda Low Referring Phys: [id number] OLADAPO ADEFESO IMPRESSIONS  1. Left ventricular ejection fraction, by estimation, is 60 to 65%. The left ventricle has normal function. The left ventricle has no regional wall motion abnormalities. There is mild left ventricular hypertrophy. Left ventricular diastolic parameters were normal.  2. Right ventricular systolic function is normal. The right ventricular size is normal. There is normal pulmonary artery systolic pressure. The estimated right ventricular systolic pressure is Q000111Q mmHg.  3. Left atrial size was moderately dilated.  4. The mitral valve is grossly normal. Trivial mitral valve regurgitation.  5. The aortic valve is tricuspid. There is mild calcification of the aortic valve. Aortic valve regurgitation is not visualized. Aortic valve mean gradient measures 5.0 mmHg.  6. The inferior vena cava is normal in size with greater than 50% respiratory variability, suggesting right atrial pressure of 3 mmHg. Comparison(s): No prior Echocardiogram. FINDINGS  Left Ventricle: Left ventricular ejection fraction, by estimation, is 60 to 65%. The left ventricle has normal function. The left ventricle has no regional wall motion abnormalities. The left ventricular internal cavity size was normal in size. There is  mild left ventricular hypertrophy. Left ventricular diastolic parameters were normal. Right Ventricle: The right ventricular size is normal. No increase in right ventricular wall thickness. Right ventricular systolic function is normal. There is normal pulmonary artery systolic pressure. The tricuspid regurgitant velocity is 2.76  m/s, and  with an assumed right atrial pressure of 3 mmHg, the estimated right ventricular systolic pressure is Q000111Q mmHg. Left Atrium: Left atrial size was moderately dilated. Right Atrium: Right atrial size was  normal in size. Pericardium: There is no evidence of pericardial effusion. Presence of pericardial fat pad. Mitral Valve: The mitral valve is grossly normal. Mild mitral annular calcification. Trivial mitral valve regurgitation. MV peak gradient, 5.4 mmHg. The mean mitral valve gradient is 2.0 mmHg. Tricuspid Valve: The tricuspid valve is grossly normal. Tricuspid valve regurgitation is trivial. Aortic Valve: The aortic valve is tricuspid. There is mild calcification of the aortic valve. There is mild aortic valve annular calcification. Aortic valve regurgitation is not visualized. Aortic valve mean gradient measures 5.0 mmHg. Aortic valve peak gradient measures 9.4 mmHg. Aortic valve area, by VTI measures 2.25 cm. Pulmonic Valve: The pulmonic valve was grossly normal. Pulmonic valve regurgitation is trivial. Aorta: The aortic root is normal in size and structure. Venous: The inferior vena cava is normal in size with greater than 50% respiratory variability, suggesting right atrial pressure of 3 mmHg. IAS/Shunts: No atrial level shunt detected by color flow Doppler.  LEFT VENTRICLE PLAX 2D LVIDd:         4.44 cm  Diastology LVIDs:         2.97 cm  LV e' medial:    8.59 cm/s LV PW:         0.98 cm  LV E/e' medial:  11.8 LV IVS:        1.37 cm  LV e' lateral:   11.30 cm/s LVOT diam:     1.90 cm  LV E/e' lateral: 8.9 LV SV:         89 LV SV Index:   45 LVOT Area:     2.84 cm  RIGHT VENTRICLE RV Basal diam:  2.64 cm RV Mid diam:    2.27 cm RV S prime:     12.10 cm/s TAPSE (M-mode): 2.2 cm LEFT ATRIUM           Index       RIGHT ATRIUM           Index LA diam:      3.70 cm 1.87 cm/m  RA Area:     12.60 cm LA Vol (A2C): 86.1 ml 43.44 ml/m RA Volume:   28.10 ml  14.18 ml/m LA Vol (A4C): 35.2 ml 17.76 ml/m   AORTIC VALVE AV Area (Vmax):    2.33 cm AV Area (Vmean):   2.32 cm AV Area (VTI):     2.25 cm AV Vmax:           153.00 cm/s AV Vmean:          108.000 cm/s AV VTI:            0.394 m AV Peak Grad:      9.4 mmHg AV Mean Grad:      5.0 mmHg LVOT Vmax:         126.00 cm/s LVOT Vmean:        88.400 cm/s LVOT VTI:          0.313 m LVOT/AV VTI ratio: 0.79  AORTA Ao Root diam: 3.20 cm Ao Asc diam:  3.00 cm MITRAL VALVE                TRICUSPID VALVE MV Area (PHT): 3.26 cm     TR Peak grad:   30.5 mmHg MV Area VTI:   3.21 cm     TR Vmax:        276.00 cm/s MV Peak grad:  5.4 mmHg MV Mean grad:  2.0 mmHg     SHUNTS MV Vmax:  1.16 m/s     Systemic VTI:  0.31 m MV Vmean:      71.6 cm/s    Systemic Diam: 1.90 cm MV Decel Time: 233 msec MV E velocity: 101.00 cm/s MV A velocity: 96.00 cm/s MV E/A ratio:  1.05 Rozann Lesches MD Electronically signed by Rozann Lesches MD Signature Date/Time: 02/15/2021/11:29:33 AM    Final    CT HEAD CODE STROKE WO CONTRAST  Result Date: 02/14/2021 CLINICAL DATA:  Code stroke.  Dizziness with left-sided numbness EXAM: CT HEAD WITHOUT CONTRAST TECHNIQUE: Contiguous axial images were obtained from the base of the skull through the vertex without intravenous contrast. COMPARISON:  None. FINDINGS: Brain: Calcified meningioma at the anterior left convexity. No associated edema within the underlying brain. No acute hemorrhage. The size and configuration of the ventricles and extra-axial CSF spaces are normal. The brain parenchyma is normal, without evidence of acute or chronic infarction. Vascular: No abnormal hyperdensity of the major intracranial arteries or dural venous sinuses. No intracranial atherosclerosis. Skull: The visualized skull base, calvarium and extracranial soft tissues are normal. Sinuses/Orbits: No fluid levels or advanced mucosal thickening of the visualized paranasal sinuses. No mastoid or middle ear effusion. The orbits are normal. ASPECTS [facility] Stroke Program Early  CT Score) - Ganglionic level infarction (caudate, lentiform nuclei, internal capsule, insula, M1-M3 cortex): 7 - Supraganglionic infarction (M4-M6 cortex): 3 Total score (0-10 with 10 being normal): 10 IMPRESSION: 1. No acute intracranial abnormality. 2. ASPECTS is 10. 3. Calcified meningioma at the anterior left convexity without associated edema within the underlying brain. These results were called by telephone at the time of interpretation on 02/14/2021 at 1:10 am to provider [facility] , who verbally acknowledged these results. Electronically Signed   By: Ulyses Jarred M.D.   On: 02/14/2021 01:10    DISCHARGE EXAMINATION: Vitals:   02/14/21 1700 02/14/21 2100 02/15/21 0500 02/15/21 0850  BP: (!) 166/64 (!) 144/66 (!) 155/61 (!) 148/65  Pulse: 83 82  81  Resp: '20 19 19   '$ Temp: 97.8 F (36.6 C) 98.4 F (36.9 C) 98.5 F (36.9 C) 98.4 F (36.9 C)  TempSrc: Oral Oral Oral   SpO2: 95% 97% 98% 93%  Weight:      Height:       General appearance: Awake alert.  In no distress Resp: Clear to auscultation bilaterally.  Normal effort Cardio: S1-S2 is normal regular.  No S3-S4.  No rubs murmurs or bruit GI: Abdomen is soft.  Nontender nondistended.  Bowel sounds are present normal.  No masses organomegaly Extremities: No edema.  Full range of motion of lower extremities. Neurologic: Alert and oriented x3.  No focal neurological deficits.    DISPOSITION: Home  Discharge Instructions     Ambulatory referral to Neurology   Complete by: As directed    An appointment is requested in approximately: 8 weeks for TIA, intracranial stenosis   Call MD for:  difficulty breathing, headache or visual disturbances   Complete by: As directed    Call MD for:  extreme fatigue   Complete by: As directed    Call MD for:  persistant dizziness or light-headedness   Complete by: As directed    Call MD for:  persistant nausea and vomiting   Complete by: As directed    Call MD for:  severe uncontrolled  pain   Complete by: As directed    Call MD for:  temperature >100.4   Complete by: As directed    Diet - low  sodium heart healthy   Complete by: As directed    Discharge instructions   Complete by: As directed    Please take your medications as prescribed.  Please be sure to call the neurosurgeons office to schedule an outpatient appointment for the meningioma.  Seek attention if your symptoms recur. Talk to your primary care provider about referring you to a lipid clinic to consider alternative medications for your high cholesterol.  You were cared for by a hospitalist during your hospital stay. If you have any questions about your discharge medications or the care you received while you were in the hospital after you are discharged, you can call the unit and asked to speak with the hospitalist on call if the hospitalist that took care of you is not available. Once you are discharged, your primary care physician will handle any further medical issues. Please note that NO REFILLS for any discharge medications will be authorized once you are discharged, as it is imperative that you return to your primary care physician (or establish a relationship with a primary care physician if you do not have one) for your aftercare needs so that they can reassess your need for medications and monitor your lab values. If you do not have a primary care physician, you can call [phone] for a physician referral.   Increase activity slowly   Complete by: As directed           Allergies as of 02/15/2021       Reactions   Codeine    Lovastatin    Other reaction(s): leg pain   Sulfa Antibiotics         Medication List     TAKE these medications    albuterol 108 (90 Base) MCG/ACT inhaler Commonly known as: VENTOLIN HFA Inhale 1-2 puffs into the lungs every 6 (six) hours as needed for wheezing.   amLODipine 5 MG tablet Commonly known as: NORVASC Take 1 tablet by mouth daily.   aspirin EC 81 MG  tablet Take 1 tablet (81 mg total) by mouth daily. For 3 months only What changed: additional instructions   atenolol 50 MG tablet Commonly known as: TENORMIN Take 50 mg by mouth 2 (two) times daily.   azelastine 0.1 % nasal spray Commonly known as: ASTELIN Place 2 sprays into both nostrils daily as needed for allergies.   calcium carbonate 1500 (600 Ca) MG Tabs tablet Commonly known as: OSCAL Take by mouth 2 (two) times daily with a meal.   clopidogrel 75 MG tablet Commonly known as: Plavix Take 1 tablet (75 mg total) by mouth daily.   doxycycline 50 MG capsule Commonly known as: VIBRAMYCIN Take 50 mg by mouth daily.   ezetimibe 10 MG tablet Commonly known as: ZETIA Take 10 mg by mouth daily.   FISH OIL PO Take 1 tablet by mouth daily.   fluticasone 50 MCG/ACT nasal spray Commonly known as: FLONASE Place 1 spray into both nostrils daily as needed for allergies.   glucosamine-chondroitin 500-400 MG tablet Take 1 tablet by mouth 3 (three) times daily.   lisinopril 20 MG tablet Commonly known as: ZESTRIL Take 20 mg by mouth daily.   loratadine 10 MG tablet Commonly known as: CLARITIN Take 10 mg by mouth daily.   pantoprazole 40 MG tablet Commonly known as: PROTONIX Take 40 mg by mouth daily.   VITAMIN C PO Take 1 tablet by mouth daily.   Vitamin D3 125 MCG (5000 UT) Tabs Take 1 tablet by mouth  daily.          Follow-up Information     Newman Pies, MD. Schedule an appointment as soon as possible for a visit.   Specialty: Neurosurgery Why: Contact the office following discharge from [facility] if you have not received information regarding a follow up appointment. Contact information: [address] [place] [address] [phone]         Orpah Melter, MD. Schedule an appointment as soon as possible for a visit in 1 week(s).   Specialty: Family Medicine Contact information: [place]  [address] [phone]         Garvin Fila, MD Follow up.   Specialties: Neurology, Radiology Why: a refferal has been sent to his office and you will hear from them soon Contact information: [address] [place] [address] [place]: 35 minutes  [place]  [facility] Pager on [email or web address]  02/16/2021, 11:25 AM

## 2021-02-15 NOTE — Care Management Obs Status (Signed)
[facility] NOTIFICATION   Patient Details  Name: Dorothy Powers MRN: [id number] Date of Birth: [birth year]   Medicare Observation Status Notification Given:  Yes    Tommy Medal 02/15/2021, 10:10 AM

## 2021-02-20 DIAGNOSIS — M21611 Bunion of right foot: Secondary | ICD-10-CM | POA: Diagnosis not present

## 2021-02-20 DIAGNOSIS — D496 Neoplasm of unspecified behavior of brain: Secondary | ICD-10-CM | POA: Diagnosis not present

## 2021-02-20 DIAGNOSIS — Z6837 Body mass index (BMI) 37.0-37.9, adult: Secondary | ICD-10-CM | POA: Diagnosis not present

## 2021-02-20 DIAGNOSIS — M25571 Pain in right ankle and joints of right foot: Secondary | ICD-10-CM | POA: Diagnosis not present

## 2021-02-20 DIAGNOSIS — M25674 Stiffness of right foot, not elsewhere classified: Secondary | ICD-10-CM | POA: Diagnosis not present

## 2021-02-20 DIAGNOSIS — I10 Essential (primary) hypertension: Secondary | ICD-10-CM | POA: Diagnosis not present

## 2021-02-20 DIAGNOSIS — R531 Weakness: Secondary | ICD-10-CM | POA: Diagnosis not present

## 2021-02-20 DIAGNOSIS — R262 Difficulty in walking, not elsewhere classified: Secondary | ICD-10-CM | POA: Diagnosis not present

## 2021-02-22 DIAGNOSIS — E785 Hyperlipidemia, unspecified: Secondary | ICD-10-CM | POA: Diagnosis not present

## 2021-02-22 DIAGNOSIS — M25674 Stiffness of right foot, not elsewhere classified: Secondary | ICD-10-CM | POA: Diagnosis not present

## 2021-02-22 DIAGNOSIS — M21611 Bunion of right foot: Secondary | ICD-10-CM | POA: Diagnosis not present

## 2021-02-22 DIAGNOSIS — M25571 Pain in right ankle and joints of right foot: Secondary | ICD-10-CM | POA: Diagnosis not present

## 2021-02-22 DIAGNOSIS — R531 Weakness: Secondary | ICD-10-CM | POA: Diagnosis not present

## 2021-02-22 DIAGNOSIS — R262 Difficulty in walking, not elsewhere classified: Secondary | ICD-10-CM | POA: Diagnosis not present

## 2021-02-22 DIAGNOSIS — G459 Transient cerebral ischemic attack, unspecified: Secondary | ICD-10-CM | POA: Diagnosis not present

## 2021-02-27 DIAGNOSIS — R531 Weakness: Secondary | ICD-10-CM | POA: Diagnosis not present

## 2021-02-27 DIAGNOSIS — R262 Difficulty in walking, not elsewhere classified: Secondary | ICD-10-CM | POA: Diagnosis not present

## 2021-02-27 DIAGNOSIS — M25674 Stiffness of right foot, not elsewhere classified: Secondary | ICD-10-CM | POA: Diagnosis not present

## 2021-02-27 DIAGNOSIS — M21611 Bunion of right foot: Secondary | ICD-10-CM | POA: Diagnosis not present

## 2021-02-27 DIAGNOSIS — M25571 Pain in right ankle and joints of right foot: Secondary | ICD-10-CM | POA: Diagnosis not present

## 2021-02-28 DIAGNOSIS — M25674 Stiffness of right foot, not elsewhere classified: Secondary | ICD-10-CM | POA: Diagnosis not present

## 2021-02-28 DIAGNOSIS — R531 Weakness: Secondary | ICD-10-CM | POA: Diagnosis not present

## 2021-02-28 DIAGNOSIS — R262 Difficulty in walking, not elsewhere classified: Secondary | ICD-10-CM | POA: Diagnosis not present

## 2021-02-28 DIAGNOSIS — M25571 Pain in right ankle and joints of right foot: Secondary | ICD-10-CM | POA: Diagnosis not present

## 2021-02-28 DIAGNOSIS — M21611 Bunion of right foot: Secondary | ICD-10-CM | POA: Diagnosis not present

## 2021-03-06 DIAGNOSIS — M25571 Pain in right ankle and joints of right foot: Secondary | ICD-10-CM | POA: Diagnosis not present

## 2021-03-06 DIAGNOSIS — R531 Weakness: Secondary | ICD-10-CM | POA: Diagnosis not present

## 2021-03-06 DIAGNOSIS — R262 Difficulty in walking, not elsewhere classified: Secondary | ICD-10-CM | POA: Diagnosis not present

## 2021-03-06 DIAGNOSIS — M25674 Stiffness of right foot, not elsewhere classified: Secondary | ICD-10-CM | POA: Diagnosis not present

## 2021-03-06 DIAGNOSIS — M21611 Bunion of right foot: Secondary | ICD-10-CM | POA: Diagnosis not present

## 2021-03-09 DIAGNOSIS — R531 Weakness: Secondary | ICD-10-CM | POA: Diagnosis not present

## 2021-03-09 DIAGNOSIS — M21611 Bunion of right foot: Secondary | ICD-10-CM | POA: Diagnosis not present

## 2021-03-09 DIAGNOSIS — M25571 Pain in right ankle and joints of right foot: Secondary | ICD-10-CM | POA: Diagnosis not present

## 2021-03-09 DIAGNOSIS — R262 Difficulty in walking, not elsewhere classified: Secondary | ICD-10-CM | POA: Diagnosis not present

## 2021-03-09 DIAGNOSIS — M25674 Stiffness of right foot, not elsewhere classified: Secondary | ICD-10-CM | POA: Diagnosis not present

## 2021-03-13 DIAGNOSIS — R262 Difficulty in walking, not elsewhere classified: Secondary | ICD-10-CM | POA: Diagnosis not present

## 2021-03-13 DIAGNOSIS — M21611 Bunion of right foot: Secondary | ICD-10-CM | POA: Diagnosis not present

## 2021-03-13 DIAGNOSIS — M25571 Pain in right ankle and joints of right foot: Secondary | ICD-10-CM | POA: Diagnosis not present

## 2021-03-13 DIAGNOSIS — R531 Weakness: Secondary | ICD-10-CM | POA: Diagnosis not present

## 2021-03-13 DIAGNOSIS — M25674 Stiffness of right foot, not elsewhere classified: Secondary | ICD-10-CM | POA: Diagnosis not present

## 2021-03-16 DIAGNOSIS — R531 Weakness: Secondary | ICD-10-CM | POA: Diagnosis not present

## 2021-03-16 DIAGNOSIS — R262 Difficulty in walking, not elsewhere classified: Secondary | ICD-10-CM | POA: Diagnosis not present

## 2021-03-16 DIAGNOSIS — M25674 Stiffness of right foot, not elsewhere classified: Secondary | ICD-10-CM | POA: Diagnosis not present

## 2021-03-16 DIAGNOSIS — M21611 Bunion of right foot: Secondary | ICD-10-CM | POA: Diagnosis not present

## 2021-03-16 DIAGNOSIS — M25571 Pain in right ankle and joints of right foot: Secondary | ICD-10-CM | POA: Diagnosis not present

## 2021-03-20 DIAGNOSIS — M25571 Pain in right ankle and joints of right foot: Secondary | ICD-10-CM | POA: Diagnosis not present

## 2021-03-20 DIAGNOSIS — M25674 Stiffness of right foot, not elsewhere classified: Secondary | ICD-10-CM | POA: Diagnosis not present

## 2021-03-20 DIAGNOSIS — M21611 Bunion of right foot: Secondary | ICD-10-CM | POA: Diagnosis not present

## 2021-03-20 DIAGNOSIS — R262 Difficulty in walking, not elsewhere classified: Secondary | ICD-10-CM | POA: Diagnosis not present

## 2021-03-20 DIAGNOSIS — R531 Weakness: Secondary | ICD-10-CM | POA: Diagnosis not present

## 2021-03-23 DIAGNOSIS — R531 Weakness: Secondary | ICD-10-CM | POA: Diagnosis not present

## 2021-03-23 DIAGNOSIS — M25571 Pain in right ankle and joints of right foot: Secondary | ICD-10-CM | POA: Diagnosis not present

## 2021-03-23 DIAGNOSIS — R262 Difficulty in walking, not elsewhere classified: Secondary | ICD-10-CM | POA: Diagnosis not present

## 2021-03-23 DIAGNOSIS — M21611 Bunion of right foot: Secondary | ICD-10-CM | POA: Diagnosis not present

## 2021-03-23 DIAGNOSIS — M25674 Stiffness of right foot, not elsewhere classified: Secondary | ICD-10-CM | POA: Diagnosis not present

## 2021-03-25 DIAGNOSIS — L03115 Cellulitis of right lower limb: Secondary | ICD-10-CM | POA: Diagnosis not present

## 2021-03-28 DIAGNOSIS — I10 Essential (primary) hypertension: Secondary | ICD-10-CM | POA: Diagnosis not present

## 2021-03-28 DIAGNOSIS — L0390 Cellulitis, unspecified: Secondary | ICD-10-CM | POA: Diagnosis not present

## 2021-03-28 DIAGNOSIS — E785 Hyperlipidemia, unspecified: Secondary | ICD-10-CM | POA: Diagnosis not present

## 2021-04-04 DIAGNOSIS — R262 Difficulty in walking, not elsewhere classified: Secondary | ICD-10-CM | POA: Diagnosis not present

## 2021-04-04 DIAGNOSIS — R531 Weakness: Secondary | ICD-10-CM | POA: Diagnosis not present

## 2021-04-04 DIAGNOSIS — M21611 Bunion of right foot: Secondary | ICD-10-CM | POA: Diagnosis not present

## 2021-04-04 DIAGNOSIS — M25674 Stiffness of right foot, not elsewhere classified: Secondary | ICD-10-CM | POA: Diagnosis not present

## 2021-04-04 DIAGNOSIS — M25571 Pain in right ankle and joints of right foot: Secondary | ICD-10-CM | POA: Diagnosis not present

## 2021-04-06 DIAGNOSIS — M21611 Bunion of right foot: Secondary | ICD-10-CM | POA: Diagnosis not present

## 2021-04-06 DIAGNOSIS — R262 Difficulty in walking, not elsewhere classified: Secondary | ICD-10-CM | POA: Diagnosis not present

## 2021-04-06 DIAGNOSIS — R531 Weakness: Secondary | ICD-10-CM | POA: Diagnosis not present

## 2021-04-06 DIAGNOSIS — M25571 Pain in right ankle and joints of right foot: Secondary | ICD-10-CM | POA: Diagnosis not present

## 2021-04-06 DIAGNOSIS — M25674 Stiffness of right foot, not elsewhere classified: Secondary | ICD-10-CM | POA: Diagnosis not present

## 2021-04-11 DIAGNOSIS — R531 Weakness: Secondary | ICD-10-CM | POA: Diagnosis not present

## 2021-04-11 DIAGNOSIS — M25571 Pain in right ankle and joints of right foot: Secondary | ICD-10-CM | POA: Diagnosis not present

## 2021-04-11 DIAGNOSIS — M21611 Bunion of right foot: Secondary | ICD-10-CM | POA: Diagnosis not present

## 2021-04-11 DIAGNOSIS — M25674 Stiffness of right foot, not elsewhere classified: Secondary | ICD-10-CM | POA: Diagnosis not present

## 2021-04-11 DIAGNOSIS — R262 Difficulty in walking, not elsewhere classified: Secondary | ICD-10-CM | POA: Diagnosis not present

## 2021-04-12 ENCOUNTER — Ambulatory Visit (INDEPENDENT_AMBULATORY_CARE_PROVIDER_SITE_OTHER): Payer: Medicare Other | Admitting: Neurology

## 2021-04-12 ENCOUNTER — Encounter: Payer: Self-pay | Admitting: Neurology

## 2021-04-12 ENCOUNTER — Other Ambulatory Visit: Payer: Self-pay

## 2021-04-12 VITALS — BP 142/70 | HR 65 | Ht 63.0 in | Wt 216.0 lb

## 2021-04-12 DIAGNOSIS — G459 Transient cerebral ischemic attack, unspecified: Secondary | ICD-10-CM

## 2021-04-12 DIAGNOSIS — D329 Benign neoplasm of meninges, unspecified: Secondary | ICD-10-CM | POA: Diagnosis not present

## 2021-04-12 NOTE — Progress Notes (Signed)
[facility]  PATIENT: Dorothy LIEBERT DOB: 11-28-1945  REFERRING CLINICIAN: Orpah Melter, MD HISTORY FROM: Patient  REASON FOR VISIT: TIA    HISTORICAL  CHIEF COMPLAINT:  Chief Complaint  Patient presents with   New Patient (Initial Visit)    Room 12 - alone. ED follow up for TIA on 02/14/21. She is currently taking both Plavix 75mg  and aspirin 81mg  daily. She was instructed to stop the aspirin 04/16/21 and continue Plavix alone. She is also being followed by Dr. Newman Pies for a meningioma.    HISTORY OF PRESENT ILLNESS:  This is a 75 year old woman with past medical history of hypertension and hyperlipidemia who is presenting following a hospital stay for TIA.  Patient said that she presented to the hospital after 3 episodes of left leg and foot numbness.  Initially the episodes lasted less than a minute, she got concerned and presented to the hospital.  In the hospital she also reported having 2 additional episodes with 1 lasting longer.  She had a full stroke work-up including MRI which was negative for any acute stroke but showed a incidental left frontal meningioma, Her MRI showed significant vascular disease in the intracranial vessel, and her echocardiogram did not show any significant abnormality.  Her stroke labs including a lipid panel showed a LDL of 125 and a hemoglobin A1c of 6.0. She was discharged with DAPT for 3 months and to continue with Plavix thereafter.  Since being discharged from the hospital, patient denies any additional episodes of numbness, no weakness no other complaint. For her meningioma she did follow with neurosurgery and they plan to repeat the MRI in 6 months.  She denies any symptoms involving the right side, denies any abnormal smell or taste, denies any feeling of fear or doom, denies any personality change, and denies any weakness or tingling of the right side of the body.    OTHER MEDICAL CONDITIONS: HLD, HTN, left  frontal meningioma   REVIEW OF SYSTEMS: Full 14 system review of systems performed and negative with exception of: as noted in the HPI  ALLERGIES: Allergies  Allergen Reactions   Codeine Other (See Comments)    "Couldn't move one night for a short time, after taking the medicaiton"   Lovastatin Other (See Comments)    Other reaction(s): leg pain   Sulfa Antibiotics Other (See Comments)    "Felt weak"    HOME MEDICATIONS: Outpatient Medications Prior to Visit  Medication Sig Dispense Refill   albuterol (VENTOLIN HFA) 108 (90 Base) MCG/ACT inhaler Inhale 1-2 puffs into the lungs every 6 (six) hours as needed for wheezing.     Ascorbic Acid (VITAMIN C PO) Take 1 tablet by mouth daily.     aspirin EC 81 MG tablet Take 1 tablet (81 mg total) by mouth daily. For 3 months only 30 tablet 2   atenolol (TENORMIN) 50 MG tablet Take 50 mg by mouth 2 (two) times daily.     azelastine (ASTELIN) 0.1 % nasal spray Place 2 sprays into both nostrils daily as needed for allergies.     calcium carbonate (OSCAL) 1500 (600 Ca) MG TABS tablet Take by mouth 2 (two) times daily with a meal.     cetirizine (ZYRTEC) 10 MG tablet Take 10 mg by mouth daily.     Cholecalciferol (VITAMIN D3) 125 MCG (5000 UT) TABS Take 1 tablet by mouth daily.     clopidogrel (PLAVIX) 75 MG tablet Take 1 tablet (75 mg total) by mouth  daily. 30 tablet 11   Coenzyme Q10 (CO Q 10 PO) Take 1 tablet by mouth daily.     doxycycline (VIBRAMYCIN) 50 MG capsule Take 50 mg by mouth daily.     ezetimibe (ZETIA) 10 MG tablet Take 10 mg by mouth daily.     fluticasone (FLONASE) 50 MCG/ACT nasal spray Place 1 spray into both nostrils daily as needed for allergies.     glucosamine-chondroitin 500-400 MG tablet Take 1 tablet by mouth 3 (three) times daily.     lisinopril (ZESTRIL) 20 MG tablet Take 20 mg by mouth daily.     Omega-3 Fatty Acids (FISH OIL PO) Take 1 tablet by mouth daily.     pantoprazole (PROTONIX) 40 MG tablet Take 40 mg by  mouth daily.     rosuvastatin (CRESTOR) 10 MG tablet Take 10 mg by mouth daily.     amLODipine (NORVASC) 5 MG tablet Take 1 tablet by mouth daily.     loratadine (CLARITIN) 10 MG tablet Take 10 mg by mouth daily.     No facility-administered medications prior to visit.    PAST MEDICAL HISTORY: Past Medical History:  Diagnosis Date   Hypertension    Meningioma (HCC)    Tachycardia    TIA (transient ischemic attack)     PAST SURGICAL HISTORY: Past Surgical History:  Procedure Laterality Date   ABDOMINAL HYSTERECTOMY     BUNIONECTOMY Right 11/2020    FAMILY HISTORY: Family History  Problem Relation Age of Onset   Heart disease Mother        pacemaker   Heart attack Father     SOCIAL HISTORY: Social History   Socioeconomic History   Marital status: Married    Spouse name: Not on file   Number of children: 0   Years of education: college   Highest education level: Bachelor's degree (e.g., BA, AB, BS)  Occupational History   Occupation: Retired  Tobacco Use   Smoking status: Former    Types: Cigarettes   Smokeless tobacco: Never  Substance and Sexual Activity   Alcohol use: Yes    Comment: social   Drug use: Never   Sexual activity: Not on file  Other Topics Concern   Not on file  Social History Narrative   Lives at home with husband.   Right-handed.   No daily use of caffeine.   Social Determinants of Health   Financial Resource Strain: Not on file  Food Insecurity: Not on file  Transportation Needs: Not on file  Physical Activity: Not on file  Stress: Not on file  Social Connections: Not on file  Intimate Partner Violence: Not on file     PHYSICAL EXAM  GENERAL EXAM/CONSTITUTIONAL: Vitals:  Vitals:   04/12/21 1450  BP: (!) 142/70  Pulse: 65  Weight: 216 lb (98 kg)  Height: 5\' 3"  (1.6 m)   Body mass index is 38.26 kg/m. Wt Readings from Last 3 Encounters:  04/12/21 216 lb (98 kg)  02/14/21 212 lb (96.2 kg)  01/19/19 204 lb (92.5 kg)    Patient is in no distress; well developed, nourished and groomed; neck is supple  EYES: Pupils round and reactive to light, Visual fields full to confrontation, Extraocular movements intacts,   MUSCULOSKELETAL: Gait, strength, tone, movements noted in Neurologic exam below  NEUROLOGIC: MENTAL STATUS:  No flowsheet data found. awake, alert, oriented to person, place and time recent and remote memory intact normal attention and concentration language fluent, comprehension intact, naming intact fund of  knowledge appropriate  CRANIAL NERVE:  2nd, 3rd, 4th, 6th - pupils equal and reactive to light, visual fields full to confrontation, extraocular muscles intact, no nystagmus 5th - facial sensation symmetric 7th - facial strength symmetric 8th - hearing intact 9th - palate elevates symmetrically, uvula midline 11th - shoulder shrug symmetric 12th - tongue protrusion midline  MOTOR:  normal bulk and tone, full strength in the BUE, BLE  SENSORY:  normal and symmetric to light touch, pinprick, temperature, vibration  COORDINATION:  finger-nose-finger, fine finger movements normal  REFLEXES:  deep tendon reflexes present and symmetric  GAIT/STATION:  normal    DIAGNOSTIC DATA (LABS, IMAGING, TESTING) - I reviewed patient records, labs, notes, testing and imaging myself where available.  Lab Results  Component Value Date   WBC 6.4 02/14/2021   HGB 13.4 02/14/2021   HCT 40.5 02/14/2021   MCV 89.0 02/14/2021   PLT 279 02/14/2021      Component Value Date/Time   NA 140 02/14/2021 0553   K 4.0 02/14/2021 0553   CL 104 02/14/2021 0553   CO2 27 02/14/2021 0553   GLUCOSE 111 (H) 02/14/2021 0553   BUN 15 02/14/2021 0553   CREATININE 0.55 02/14/2021 0553   CALCIUM 9.1 02/14/2021 0553   PROT 6.8 02/14/2021 0553   ALBUMIN 3.7 02/14/2021 0553   AST 19 02/14/2021 0553   ALT 18 02/14/2021 0553   ALKPHOS 94 02/14/2021 0553   BILITOT 0.6 02/14/2021 0553   GFRNONAA >60  02/14/2021 0553   Lab Results  Component Value Date   CHOL 203 (H) 02/14/2021   HDL 67 02/14/2021   LDLCALC 125 (H) 02/14/2021   TRIG 55 02/14/2021   CHOLHDL 3.0 02/14/2021   Lab Results  Component Value Date   HGBA1C 6.0 (H) 02/14/2021   No results found for: VITAMINB12 No results found for: TSH   Head CT 02/14/21 1. No acute intracranial abnormality. 2. ASPECTS is 10. 3. Calcified meningioma at the anterior left convexity without associated edema within the underlying brain.   MRI Brain 1. No evidence of acute infarction. 2. Redemonstrated 3.7 x 3.8 cm calcified meningioma overlying the lateral left frontal lobe. Mass effect upon the underlying left frontal lobe without significant underlying parenchymal edema. No midline shift. 3. Mild generalized cerebral atrophy. 4. Indeterminate 5 mm enhancing lesion within the right parietal calvarium. 13-month MRI follow-up is recommended to ensure stability and exclude worrisome etiologies (such as osseous metastatic disease). 5. Incompletely assessed cervical spondylosis. 6. C3-C4 and C4-C5 grade 1 anterolisthesis.    MRA head: 1. No intracranial large vessel occlusion. 2. Intracranial atherosclerotic disease with multifocal stenoses, most notably as follows. 3. Mild-to-moderate stenosis within the pre-cavernous/cavernous right ICA. 4. Severe stenosis within the left posterior cerebral artery at the P3/P4 junction. 5. Severe stenosis within the P4 segment of the right posterior cerebral artery. 6. 1-2 mm inferiorly projecting vascular protrusion arising from the cavernous right ICA, likely reflecting an aneurysm. 7. 1-2 mm inferiorly projecting vascular protrusions arising from the paraclinoid internal carotid arteries bilaterally, which may reflect aneurysms or infundibula.    MRA neck: 1. Common carotid and internal carotid arteries patent within the neck without hemodynamically significant stenosis. Mild atherosclerotic plaque  about the right carotid bifurcation. 2. Vertebral arteries patent within the neck with antegrade flow. Apparent moderate stenosis at the origin of the left vertebral artery.   Echo 02/15/21 Left Ventricle: Left ventricular ejection fraction, by estimation, is 60 to 65%. The left ventricle has normal function. The left  ventricle has no regional wall motion abnormalities. The left ventricular internal cavity size was normal in size. There is mild left ventricular hypertrophy. Left ventricular diastolic parameters were normal   EEG 02/14/2021 This study is suggestive of cortical dysfunction arising from bitemporal region, nonspecific etiology. No seizures or definite epileptiform discharges were seen throughout the recording.    ASSESSMENT AND PLAN  75 y.o. year old female with vascular risk factors including hypertension and hyperlipidemia who is presented to the ED following 3 episodes of left arm and leg numbness.  She was diagnosed with TIA and had a full TIA work-up.  MRI negative for acute stroke but was found to have moderate to severe intracranial vessel stenosis.  She was started on DAPT for 11-months and to continue with Plavix thereafter.  Patient stated since discharge from the hospital she has not had any additional symptoms. She was also found incidentally to have a left frontal meningioma.  She did follow-up with neurosurgery and plan is to repeat the MRI in 6 months.  There were also a 5 mm enhancing lesion within the right parietal calvarium, we will continue to follow that with MRIs.  Patient is scheduled for a MRI in 63-month.  I will see her in a year for follow-up.  I advised patient to take her medications as prescribed, lifestyle modification, adhere to a good diet and increase exercise.   1. TIA (transient ischemic attack)   2. Meningioma (HCC)     PLAN: Continue current medications: dual antiplatelets therapy for a total to 3 months then continue with Plavix thereafter Follow  up with neurosurgery as scheduled  Return in 1 year or sooner if worse  Because of the meningioma is located left frontal/temporal area, symptoms to look for are changes in mental status, changes in personality, changes in smell or taste, feeling of sense of doom and any abnormal feeling, movement involving the right side of body.    No orders of the defined types were placed in this encounter.   No orders of the defined types were placed in this encounter.   Return in about 1 year (around 04/12/2022).    Alric Ran, MD 04/12/2021, 4:42 PM  [facility] [address] [place], [place] [address] [phone]

## 2021-04-12 NOTE — Patient Instructions (Addendum)
Continue current medications: dual antiplatelets therapy for a total to 3 months then continue with Plavix thereafter   Follow up with neurosurgery as scheduled   Return in 1 year or sooner if worse  Because of the meningioma is located left frontal/temporal area, symptoms to look for are changes in mental status, changes in personality, changes in smell or taste, feeling of sense of doom and any abnormal feeling, movement involving the right side of body.

## 2021-04-13 DIAGNOSIS — M21611 Bunion of right foot: Secondary | ICD-10-CM | POA: Diagnosis not present

## 2021-04-13 DIAGNOSIS — M25571 Pain in right ankle and joints of right foot: Secondary | ICD-10-CM | POA: Diagnosis not present

## 2021-04-13 DIAGNOSIS — R531 Weakness: Secondary | ICD-10-CM | POA: Diagnosis not present

## 2021-04-13 DIAGNOSIS — R262 Difficulty in walking, not elsewhere classified: Secondary | ICD-10-CM | POA: Diagnosis not present

## 2021-04-13 DIAGNOSIS — M25674 Stiffness of right foot, not elsewhere classified: Secondary | ICD-10-CM | POA: Diagnosis not present

## 2021-04-18 DIAGNOSIS — M25674 Stiffness of right foot, not elsewhere classified: Secondary | ICD-10-CM | POA: Diagnosis not present

## 2021-04-18 DIAGNOSIS — M25571 Pain in right ankle and joints of right foot: Secondary | ICD-10-CM | POA: Diagnosis not present

## 2021-04-18 DIAGNOSIS — M21611 Bunion of right foot: Secondary | ICD-10-CM | POA: Diagnosis not present

## 2021-04-18 DIAGNOSIS — R531 Weakness: Secondary | ICD-10-CM | POA: Diagnosis not present

## 2021-04-18 DIAGNOSIS — R262 Difficulty in walking, not elsewhere classified: Secondary | ICD-10-CM | POA: Diagnosis not present

## 2021-04-20 DIAGNOSIS — M25674 Stiffness of right foot, not elsewhere classified: Secondary | ICD-10-CM | POA: Diagnosis not present

## 2021-04-20 DIAGNOSIS — M25571 Pain in right ankle and joints of right foot: Secondary | ICD-10-CM | POA: Diagnosis not present

## 2021-04-20 DIAGNOSIS — R262 Difficulty in walking, not elsewhere classified: Secondary | ICD-10-CM | POA: Diagnosis not present

## 2021-04-20 DIAGNOSIS — M21611 Bunion of right foot: Secondary | ICD-10-CM | POA: Diagnosis not present

## 2021-04-20 DIAGNOSIS — R531 Weakness: Secondary | ICD-10-CM | POA: Diagnosis not present

## 2021-04-25 DIAGNOSIS — M25571 Pain in right ankle and joints of right foot: Secondary | ICD-10-CM | POA: Diagnosis not present

## 2021-04-25 DIAGNOSIS — M21611 Bunion of right foot: Secondary | ICD-10-CM | POA: Diagnosis not present

## 2021-04-25 DIAGNOSIS — R531 Weakness: Secondary | ICD-10-CM | POA: Diagnosis not present

## 2021-04-25 DIAGNOSIS — M25674 Stiffness of right foot, not elsewhere classified: Secondary | ICD-10-CM | POA: Diagnosis not present

## 2021-04-25 DIAGNOSIS — R262 Difficulty in walking, not elsewhere classified: Secondary | ICD-10-CM | POA: Diagnosis not present

## 2021-05-01 DIAGNOSIS — R531 Weakness: Secondary | ICD-10-CM | POA: Diagnosis not present

## 2021-05-01 DIAGNOSIS — M25571 Pain in right ankle and joints of right foot: Secondary | ICD-10-CM | POA: Diagnosis not present

## 2021-05-01 DIAGNOSIS — R262 Difficulty in walking, not elsewhere classified: Secondary | ICD-10-CM | POA: Diagnosis not present

## 2021-05-01 DIAGNOSIS — M25674 Stiffness of right foot, not elsewhere classified: Secondary | ICD-10-CM | POA: Diagnosis not present

## 2021-05-01 DIAGNOSIS — M21611 Bunion of right foot: Secondary | ICD-10-CM | POA: Diagnosis not present

## 2021-05-02 DIAGNOSIS — Z23 Encounter for immunization: Secondary | ICD-10-CM | POA: Diagnosis not present

## 2021-05-02 DIAGNOSIS — N390 Urinary tract infection, site not specified: Secondary | ICD-10-CM | POA: Diagnosis not present

## 2021-05-07 DIAGNOSIS — D225 Melanocytic nevi of trunk: Secondary | ICD-10-CM | POA: Diagnosis not present

## 2021-05-07 DIAGNOSIS — L821 Other seborrheic keratosis: Secondary | ICD-10-CM | POA: Diagnosis not present

## 2021-05-07 DIAGNOSIS — D1801 Hemangioma of skin and subcutaneous tissue: Secondary | ICD-10-CM | POA: Diagnosis not present

## 2021-05-07 DIAGNOSIS — L570 Actinic keratosis: Secondary | ICD-10-CM | POA: Diagnosis not present

## 2021-05-07 DIAGNOSIS — L661 Lichen planopilaris: Secondary | ICD-10-CM | POA: Diagnosis not present

## 2021-05-07 DIAGNOSIS — L718 Other rosacea: Secondary | ICD-10-CM | POA: Diagnosis not present

## 2021-05-09 DIAGNOSIS — M21611 Bunion of right foot: Secondary | ICD-10-CM | POA: Diagnosis not present

## 2021-05-09 DIAGNOSIS — M25571 Pain in right ankle and joints of right foot: Secondary | ICD-10-CM | POA: Diagnosis not present

## 2021-05-09 DIAGNOSIS — R262 Difficulty in walking, not elsewhere classified: Secondary | ICD-10-CM | POA: Diagnosis not present

## 2021-05-09 DIAGNOSIS — M25674 Stiffness of right foot, not elsewhere classified: Secondary | ICD-10-CM | POA: Diagnosis not present

## 2021-05-09 DIAGNOSIS — R531 Weakness: Secondary | ICD-10-CM | POA: Diagnosis not present

## 2021-05-14 DIAGNOSIS — H02831 Dermatochalasis of right upper eyelid: Secondary | ICD-10-CM | POA: Diagnosis not present

## 2021-05-14 DIAGNOSIS — H1045 Other chronic allergic conjunctivitis: Secondary | ICD-10-CM | POA: Diagnosis not present

## 2021-05-14 DIAGNOSIS — H43811 Vitreous degeneration, right eye: Secondary | ICD-10-CM | POA: Diagnosis not present

## 2021-05-14 DIAGNOSIS — Z961 Presence of intraocular lens: Secondary | ICD-10-CM | POA: Diagnosis not present

## 2021-05-14 DIAGNOSIS — H04123 Dry eye syndrome of bilateral lacrimal glands: Secondary | ICD-10-CM | POA: Diagnosis not present

## 2021-05-14 DIAGNOSIS — H02834 Dermatochalasis of left upper eyelid: Secondary | ICD-10-CM | POA: Diagnosis not present

## 2021-05-22 ENCOUNTER — Other Ambulatory Visit: Payer: Self-pay

## 2021-05-22 ENCOUNTER — Ambulatory Visit (INDEPENDENT_AMBULATORY_CARE_PROVIDER_SITE_OTHER): Payer: Medicare Other | Admitting: Podiatry

## 2021-05-22 ENCOUNTER — Encounter: Payer: Self-pay | Admitting: Podiatry

## 2021-05-22 ENCOUNTER — Ambulatory Visit (INDEPENDENT_AMBULATORY_CARE_PROVIDER_SITE_OTHER): Payer: Medicare Other

## 2021-05-22 DIAGNOSIS — M205X9 Other deformities of toe(s) (acquired), unspecified foot: Secondary | ICD-10-CM | POA: Diagnosis not present

## 2021-05-22 DIAGNOSIS — M2011 Hallux valgus (acquired), right foot: Secondary | ICD-10-CM

## 2021-05-22 NOTE — Progress Notes (Signed)
She presents today date of surgery made 12/2020 status post [facility] bunion repair of the right foot.  She states that I am concerned that the toe is starting to lean over even further than it did prior to surgery the bump is not there medially but the toe is starting to encroach on the second toe.  She states that is painful and bothersome to her and she would like to consider having it corrected again if necessary.  Objective: Vital signs are stable she is alert and oriented x3.  She indeed does have an increase in the hallux abductus angle and the hallux interphalangeal angle as well.  She has great range of motion of the first metatarsophalangeal joint.  Radiographs taken demonstrate an osteoarthritis of the fibular sesamoid and some osteoarthritic changes with early dislocation at the first metatarsophalangeal joint.  Assessment osteoarthritis and residual bunion deformity first metatarsophalangeal joint of the right foot.  Plan: Discussed etiology pathology and surgical therapies at this point I like to go ahead and consider a [place]ride with a removal of the sesamoid and fibular sesamoid.  She understands this is minimal to it she would also go ahead and like to have the hallux interphalangeal joint fused as opposed to an [place] because of the osteoarthritic changes of that joint.  She understands this is amenable to it as well we consented her to this for this today she understands it we did go over the possible side effects and complications which may include but not limited to postop pain bleeding swelling infection recurrence need for further surgery overcorrection under correction loss of digit loss of limb loss of life.  We will have to ask her primary care doctor if we can come off of the Plavix.

## 2021-05-24 ENCOUNTER — Encounter: Payer: Self-pay | Admitting: Podiatry

## 2021-06-01 DIAGNOSIS — Z01419 Encounter for gynecological examination (general) (routine) without abnormal findings: Secondary | ICD-10-CM | POA: Diagnosis not present

## 2021-06-11 DIAGNOSIS — I10 Essential (primary) hypertension: Secondary | ICD-10-CM | POA: Diagnosis not present

## 2021-06-12 ENCOUNTER — Telehealth: Payer: Self-pay | Admitting: Urology

## 2021-06-12 NOTE — Telephone Encounter (Signed)
Pt called stating she would like a call from you that when she had her appt for her sx consult her mind went blank and she has multiple questions that she would like to discuss with you. Pt is scheduled for sx on 06/15/21.

## 2021-06-13 ENCOUNTER — Other Ambulatory Visit: Payer: Self-pay | Admitting: Podiatry

## 2021-06-13 MED ORDER — HYDROCODONE-ACETAMINOPHEN 10-325 MG PO TABS: 1.0000 | ORAL_TABLET | Freq: Four times a day (QID) | ORAL | 0 refills | Status: AC | PRN

## 2021-06-13 MED ORDER — ONDANSETRON HCL 4 MG PO TABS: 4.0000 mg | ORAL_TABLET | Freq: Three times a day (TID) | ORAL | 0 refills | Status: DC | PRN

## 2021-06-13 MED ORDER — CEPHALEXIN 500 MG PO CAPS: 500.0000 mg | ORAL_CAPSULE | Freq: Three times a day (TID) | ORAL | 0 refills | Status: DC

## 2021-06-13 NOTE — Telephone Encounter (Signed)
Called patient - answered all questions regarding surgery on Friday

## 2021-06-14 ENCOUNTER — Other Ambulatory Visit: Payer: Self-pay | Admitting: Podiatry

## 2021-06-14 MED ORDER — TRAMADOL HCL 50 MG PO TABS: 50.0000 mg | ORAL_TABLET | Freq: Three times a day (TID) | ORAL | 0 refills | Status: AC | PRN

## 2021-06-15 DIAGNOSIS — T8484XA Pain due to internal orthopedic prosthetic devices, implants and grafts, initial encounter: Secondary | ICD-10-CM | POA: Diagnosis not present

## 2021-06-15 DIAGNOSIS — Z4889 Encounter for other specified surgical aftercare: Secondary | ICD-10-CM | POA: Diagnosis not present

## 2021-06-15 DIAGNOSIS — M205X1 Other deformities of toe(s) (acquired), right foot: Secondary | ICD-10-CM | POA: Diagnosis not present

## 2021-06-15 DIAGNOSIS — M2011 Hallux valgus (acquired), right foot: Secondary | ICD-10-CM | POA: Diagnosis not present

## 2021-06-15 DIAGNOSIS — M205X9 Other deformities of toe(s) (acquired), unspecified foot: Secondary | ICD-10-CM | POA: Diagnosis not present

## 2021-06-15 DIAGNOSIS — M8930 Hypertrophy of bone, unspecified site: Secondary | ICD-10-CM | POA: Diagnosis not present

## 2021-06-15 DIAGNOSIS — M19071 Primary osteoarthritis, right ankle and foot: Secondary | ICD-10-CM | POA: Diagnosis not present

## 2021-06-15 DIAGNOSIS — M25571 Pain in right ankle and joints of right foot: Secondary | ICD-10-CM | POA: Diagnosis not present

## 2021-06-21 ENCOUNTER — Ambulatory Visit (INDEPENDENT_AMBULATORY_CARE_PROVIDER_SITE_OTHER): Payer: Medicare Other

## 2021-06-21 ENCOUNTER — Ambulatory Visit (INDEPENDENT_AMBULATORY_CARE_PROVIDER_SITE_OTHER): Payer: Medicare Other | Admitting: Podiatry

## 2021-06-21 ENCOUNTER — Other Ambulatory Visit: Payer: Self-pay

## 2021-06-21 ENCOUNTER — Encounter: Payer: Self-pay | Admitting: Podiatry

## 2021-06-21 DIAGNOSIS — M2011 Hallux valgus (acquired), right foot: Secondary | ICD-10-CM

## 2021-06-21 DIAGNOSIS — M205X9 Other deformities of toe(s) (acquired), unspecified foot: Secondary | ICD-10-CM

## 2021-06-21 DIAGNOSIS — Z9889 Other specified postprocedural states: Secondary | ICD-10-CM

## 2021-06-23 NOTE — Progress Notes (Signed)
She presents today date of surgery 06/16/2019 to release first metatarsophalangeal joint risk with removal of fibular sesamoid.  We did not perform the hallux fusion.  And we did not have to perform metatarsal osteotomy.  Objective: Vital signs are stable alert and oriented x3.  Dry sterile dressing is intact.  Once removed demonstrates no erythema moderate edema no cellulitis drainage or odor she does have a couple of areas that were rubbed due to swelling or a tight bandage.  Otherwise the toe is in a rectus position and has good range of motion without much tenderness.  Radiographs confirmed good joint alignment and a rectus hallux.  Assessment: Well-healing surgical foot.  Plan: Redressed today dressed compressive dressing encouraged range of motion exercises follow-up with her in a couple of weeks.  We also dispensed a Darco shoe because the cam walker was bothering her leg.

## 2021-06-28 ENCOUNTER — Ambulatory Visit (INDEPENDENT_AMBULATORY_CARE_PROVIDER_SITE_OTHER): Payer: Medicare Other | Admitting: Podiatry

## 2021-06-28 ENCOUNTER — Encounter: Payer: Self-pay | Admitting: Podiatry

## 2021-06-28 ENCOUNTER — Other Ambulatory Visit: Payer: Self-pay

## 2021-06-28 DIAGNOSIS — M2011 Hallux valgus (acquired), right foot: Secondary | ICD-10-CM

## 2021-06-28 DIAGNOSIS — Z9889 Other specified postprocedural states: Secondary | ICD-10-CM

## 2021-06-28 DIAGNOSIS — M205X9 Other deformities of toe(s) (acquired), unspecified foot: Secondary | ICD-10-CM

## 2021-06-30 NOTE — Progress Notes (Signed)
She presents today for her postop visit date of surgery is 06/15/2021 release of the first metatarsophalangeal joint and removal of the fibular sesamoid.  Hallux interphalangeal joint fusion was not performed.  She states this been a little more sore on the bottom of this week than it was last.  Objective: Dry sterile dressing was removed demonstrates no erythema toe appears to be rectus she has good range of motion dorsiflexion plantarflexion decrease in edema from previous surgery.  Assessment: Well-healing surgical foot.  Plan: Encourage range of motion exercises I will follow-up with her in 1 week for suture removal.  Continue to use of her Darco shoe.

## 2021-07-05 ENCOUNTER — Ambulatory Visit (INDEPENDENT_AMBULATORY_CARE_PROVIDER_SITE_OTHER): Payer: Medicare Other | Admitting: Podiatry

## 2021-07-05 ENCOUNTER — Encounter: Payer: Self-pay | Admitting: Podiatry

## 2021-07-05 ENCOUNTER — Other Ambulatory Visit: Payer: Self-pay

## 2021-07-05 DIAGNOSIS — Z9889 Other specified postprocedural states: Secondary | ICD-10-CM

## 2021-07-05 DIAGNOSIS — M2011 Hallux valgus (acquired), right foot: Secondary | ICD-10-CM | POA: Diagnosis not present

## 2021-07-05 DIAGNOSIS — M205X9 Other deformities of toe(s) (acquired), unspecified foot: Secondary | ICD-10-CM

## 2021-07-05 NOTE — Progress Notes (Signed)
She presents today date of surgery 06/15/2021 release of the first metatarsophalangeal joint with removal of fibular sesamoid.  She states that is been doing pretty good we have had some sharp shooting pains on the bottom recently but all in all has been doing well and sits straighter.  Objective: Vital signs are stable she is alert and oriented x3 much decrease in edema at this visit no erythema cellulitis drainage or odor margins are well coapted sutures were removed today.  Margins remain well coapted she has good range of motion dorsiflexion and plantarflexion.  Assessment placed her in a compression anklet continue use of the Darco shoe and I will follow-up with her in a couple of weeks to make sure she is doing well.

## 2021-07-11 DIAGNOSIS — K219 Gastro-esophageal reflux disease without esophagitis: Secondary | ICD-10-CM | POA: Diagnosis not present

## 2021-07-11 DIAGNOSIS — G459 Transient cerebral ischemic attack, unspecified: Secondary | ICD-10-CM | POA: Diagnosis not present

## 2021-07-11 DIAGNOSIS — E785 Hyperlipidemia, unspecified: Secondary | ICD-10-CM | POA: Diagnosis not present

## 2021-07-11 DIAGNOSIS — M1990 Unspecified osteoarthritis, unspecified site: Secondary | ICD-10-CM | POA: Diagnosis not present

## 2021-07-11 DIAGNOSIS — I10 Essential (primary) hypertension: Secondary | ICD-10-CM | POA: Diagnosis not present

## 2021-07-12 ENCOUNTER — Encounter: Payer: Medicare Other | Admitting: Podiatry

## 2021-07-19 ENCOUNTER — Ambulatory Visit (INDEPENDENT_AMBULATORY_CARE_PROVIDER_SITE_OTHER): Payer: Medicare Other | Admitting: Podiatry

## 2021-07-19 ENCOUNTER — Encounter: Payer: Self-pay | Admitting: Podiatry

## 2021-07-19 ENCOUNTER — Other Ambulatory Visit: Payer: Self-pay

## 2021-07-19 DIAGNOSIS — M2011 Hallux valgus (acquired), right foot: Secondary | ICD-10-CM

## 2021-07-19 DIAGNOSIS — D2371 Other benign neoplasm of skin of right lower limb, including hip: Secondary | ICD-10-CM

## 2021-07-19 DIAGNOSIS — Z9889 Other specified postprocedural states: Secondary | ICD-10-CM

## 2021-07-19 DIAGNOSIS — M205X9 Other deformities of toe(s) (acquired), unspecified foot: Secondary | ICD-10-CM

## 2021-07-19 NOTE — Progress Notes (Signed)
She presents today date of surgery 06/16/2019 release of the first metatarsophalangeal joint with removal of the fibular sesamoid.  She also has some pain around the fifth metatarsal phalangeal joint.  Objective: Vital signs are stable alert oriented x3 still some tenderness on palpation on the plantar aspect where the fibular sesamoid was prescribed great range of motion of the first metatarsophalangeal joint and it sits rectus.  She also has tenderness on palpation of the fifth metatarsal plantar lateral aspect where it appears to have rubbed in the shoe resulting in a porokeratotic lesion and bleeding beneath the skin.  The skin was debrided and the porokeratotic l where we removed the [place] of the lesion.  Placed silicone padding and she will follow-up with [place] in 2 to 3 weeks for reevaluation may need to consider physical therapy with her.

## 2021-07-20 DIAGNOSIS — H1045 Other chronic allergic conjunctivitis: Secondary | ICD-10-CM | POA: Diagnosis not present

## 2021-07-20 DIAGNOSIS — J300 Vasomotor rhinitis: Secondary | ICD-10-CM | POA: Diagnosis not present

## 2021-07-20 DIAGNOSIS — R040 Epistaxis: Secondary | ICD-10-CM | POA: Diagnosis not present

## 2021-07-20 DIAGNOSIS — J310 Chronic rhinitis: Secondary | ICD-10-CM | POA: Diagnosis not present

## 2021-07-26 ENCOUNTER — Encounter: Payer: Medicare Other | Admitting: Podiatry

## 2021-08-02 ENCOUNTER — Other Ambulatory Visit: Payer: Self-pay

## 2021-08-02 ENCOUNTER — Ambulatory Visit (INDEPENDENT_AMBULATORY_CARE_PROVIDER_SITE_OTHER): Payer: Medicare Other | Admitting: Podiatry

## 2021-08-02 ENCOUNTER — Ambulatory Visit: Payer: Medicare Other

## 2021-08-02 ENCOUNTER — Encounter: Payer: Self-pay | Admitting: Podiatry

## 2021-08-02 DIAGNOSIS — M2011 Hallux valgus (acquired), right foot: Secondary | ICD-10-CM | POA: Diagnosis not present

## 2021-08-02 DIAGNOSIS — Z9889 Other specified postprocedural states: Secondary | ICD-10-CM

## 2021-08-02 DIAGNOSIS — M205X9 Other deformities of toe(s) (acquired), unspecified foot: Secondary | ICD-10-CM

## 2021-08-02 DIAGNOSIS — M7751 Other enthesopathy of right foot: Secondary | ICD-10-CM

## 2021-08-02 MED ORDER — DEXAMETHASONE SODIUM PHOSPHATE 120 MG/30ML IJ SOLN
2.0000 mg | Freq: Once | INTRAMUSCULAR | Status: AC
  Administered 2021-08-02: 2 mg via INTRA_ARTICULAR

## 2021-08-02 NOTE — Progress Notes (Signed)
She presents today for follow-up of her release of the first metatarsophalangeal joint and sesamoidectomy date of surgery 06/15/2021 she states that she is doing much better as far as that goes however she is having pain to the lateral aspect of her foot as she points to the fifth metatarsophalangeal joint right foot.  She states that this been going on prior to the surgery but it seems to be worsening.  Objective: Vital signs are stable alert oriented x3 there is still edema and some erythema particularly overlying the fifth metatarsal phalangeal joint area but some edema overlying the first metatarsophalangeal joint surgical site.  Assessment: Well-healing surgical foot.  She does have bursitis capsulitis fifth metatarsophalangeal joint.  Plan: I injected dexamethasone 2 mg and local anesthetic to the point of maximal tenderness today.  She tolerated that well I will follow-up with her in about 3 to 4 weeks to make sure she is improving.

## 2021-08-06 ENCOUNTER — Other Ambulatory Visit: Payer: Self-pay | Admitting: Family Medicine

## 2021-08-06 ENCOUNTER — Other Ambulatory Visit: Payer: Self-pay | Admitting: Neurosurgery

## 2021-08-06 DIAGNOSIS — D496 Neoplasm of unspecified behavior of brain: Secondary | ICD-10-CM

## 2021-08-25 ENCOUNTER — Ambulatory Visit
Admission: RE | Admit: 2021-08-25 | Discharge: 2021-08-25 | Disposition: A | Discharge status: Home / Self Care | Payer: Medicare Other | Source: Ambulatory Visit | Attending: Neurosurgery | Admitting: Neurosurgery

## 2021-08-25 ENCOUNTER — Other Ambulatory Visit: Payer: Self-pay

## 2021-08-25 DIAGNOSIS — D329 Benign neoplasm of meninges, unspecified: Secondary | ICD-10-CM | POA: Diagnosis not present

## 2021-08-25 DIAGNOSIS — D496 Neoplasm of unspecified behavior of brain: Secondary | ICD-10-CM

## 2021-08-25 IMAGING — MR MR HEAD WO/W CM
13 series · 48 of 48 positions shown · IV contrast (multihance)
Comparison: [DATE]

CLINICAL DATA: Meningioma, follow-up

EXAM:
MRI HEAD WITHOUT AND WITH CONTRAST
TECHNIQUE: Multiplanar, multiecho pulse sequences of the brain and surrounding
structures were obtained without and with intravenous contrast.
CONTRAST:  20mL MULTIHANCE GADOBENATE DIMEGLUMINE 529 MG/ML IV SOLN

[Series 2: T1 · sagittal · 5.0mm · 0.45mm/px · 2 of 25 slices shown]
[im 1/25]
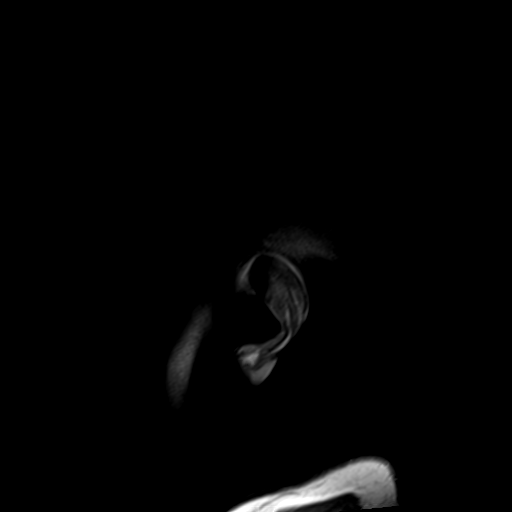
[im 25/25]
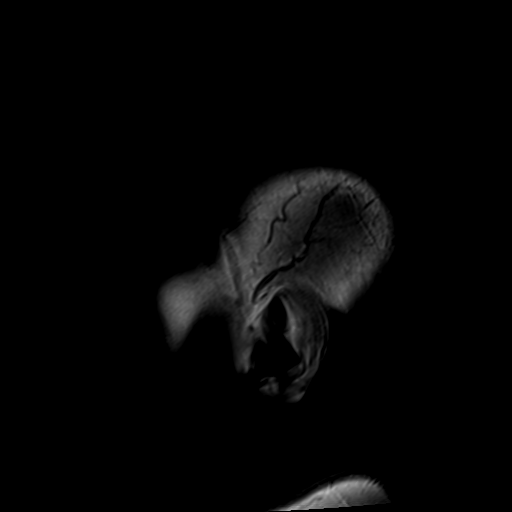

[Series 3: ax ep2d_diff_3 · axial · 3.0mm · 1.80mm/px · z∈[-41,+107]mm · 6 of 108 slices shown]
[im 1/108]
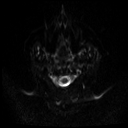
[im 22/108]
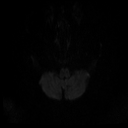
[im 43/108]
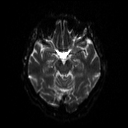
[im 65/108]
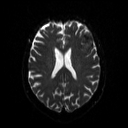
[im 86/108]
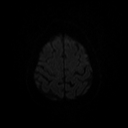
[im 108/108]
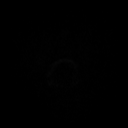

[Series 4: ax ep2d_diff_3_adc · axial · 3.0mm · 1.80mm/px · z∈[-41,+107]mm · 3 of 55 slices shown]
[im 1/55]
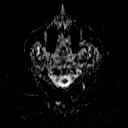
[im 28/55]
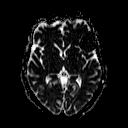
[im 55/55]
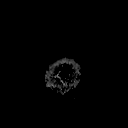

[Series 5: cor ep2d_diff · coronal · 5.0mm · 1.77mm/px · 3 of 55 slices shown]
[im 1/55]
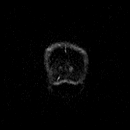
[im 28/55]
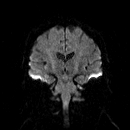
[im 55/55]
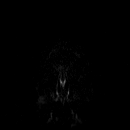

[Series 6: cor ep2d_diff_adc · coronal · 5.0mm · 1.77mm/px · 2 of 28 slices shown]
[im 1/28]
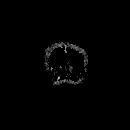
[im 28/28]
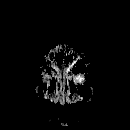

[Series 8: swi_images · axial · 2.0mm · 0.98mm/px · z∈[-31,+114]mm · 5 of 80 slices shown]
[im 1/80]
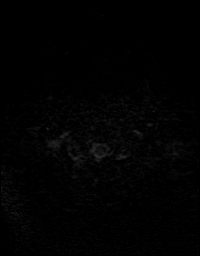
[im 20/80]
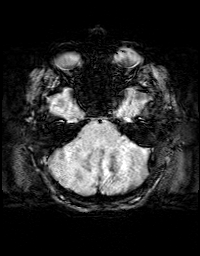
[im 40/80]
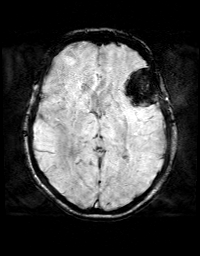
[im 60/80]
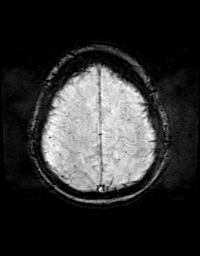
[im 80/80]
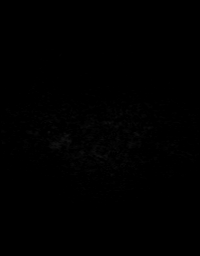

[Series 9: FLAIR · axial · 3.0mm · 0.43mm/px · z∈[-34,+105]mm · 2 of 40 slices shown]
[im 1/40]
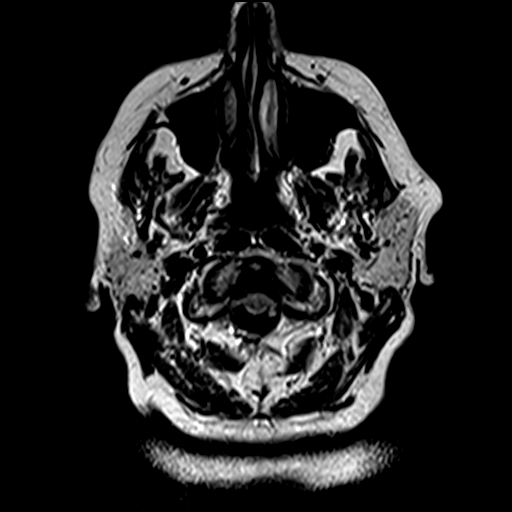
[im 40/40]
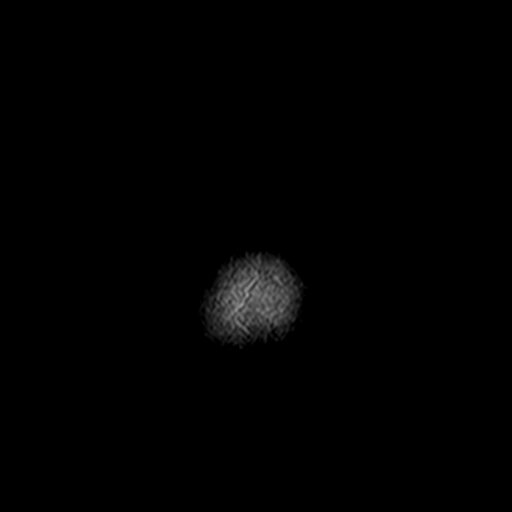

[Series 10: T2 · axial · 5.0mm · 0.65mm/px · z∈[-30,+113]mm · 2 of 27 slices shown (1 of 2)]
[im 1/27]
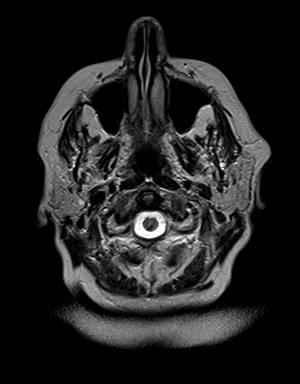
[im 27/27]
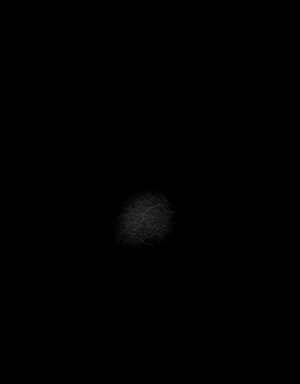

[Series 11: t1_mpr_tra · axial · 1.0mm · 0.72mm/px · z∈[-35,+110]mm · 9 of 160 slices shown]
[im 1/160]
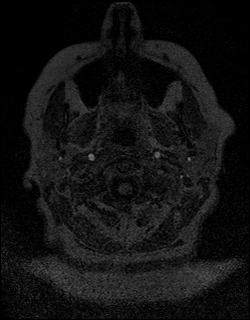
[im 20/160]
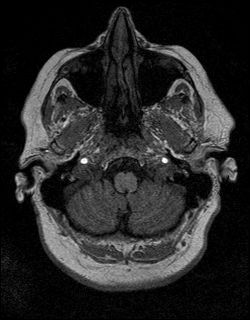
[im 40/160]
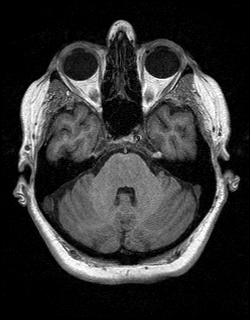
[im 60/160]
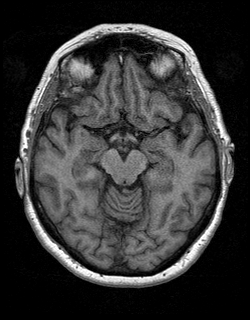
[im 80/160]
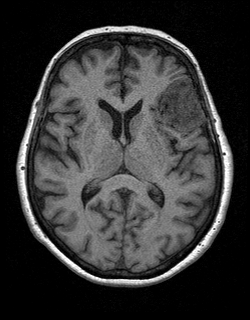
[im 100/160]
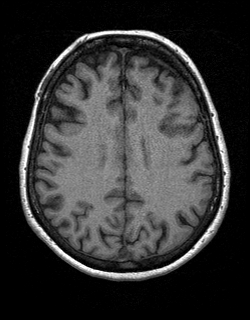
[im 120/160]
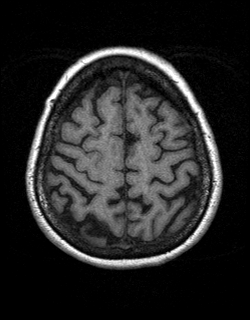
[im 140/160]
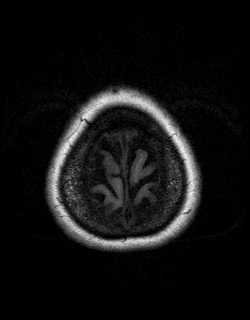
[im 160/160]
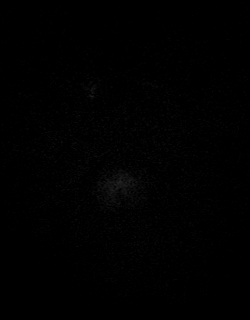

[Series 12: T2 · coronal · 5.0mm · 0.43mm/px · 2 of 28 slices shown (2 of 2)]
[im 1/28]
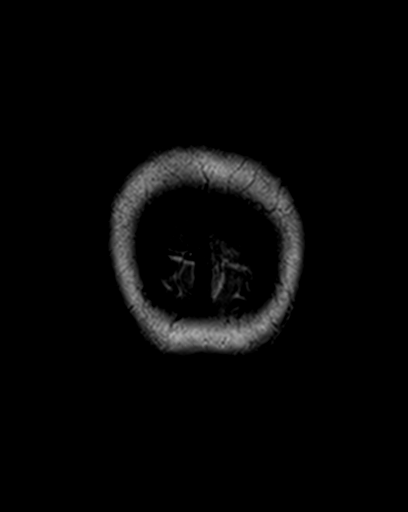
[im 28/28]
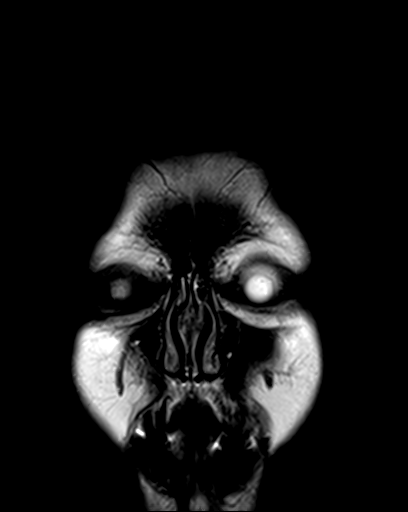

[Series 13: post t1_mpr_tra · axial · 1.0mm · 0.72mm/px · z∈[-35,+110]mm · 9 of 160 slices shown]
[im 1/160]
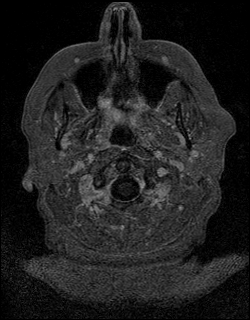
[im 20/160]
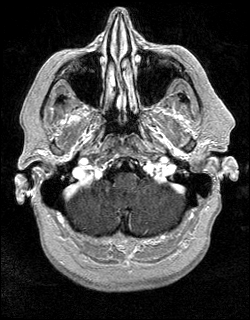
[im 40/160]
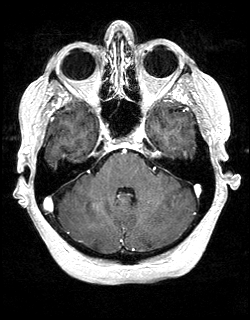
[im 60/160]
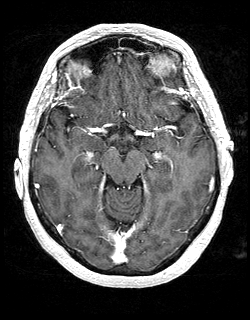
[im 80/160]
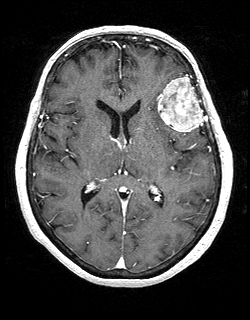
[im 100/160]
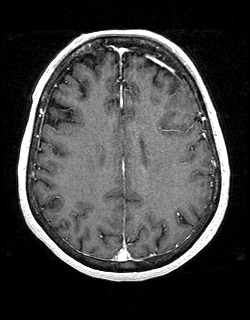
[im 120/160]
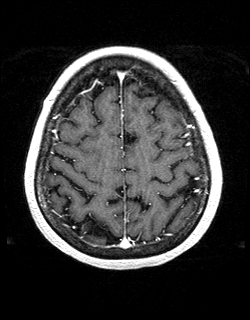
[im 140/160]
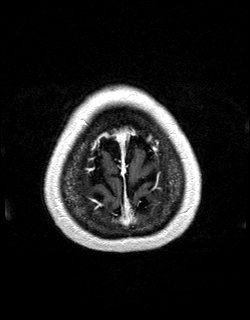
[im 160/160]
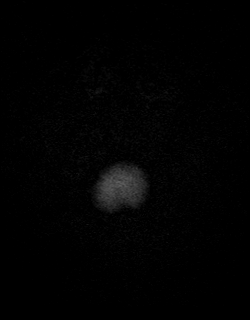

[Series 14: T1 post-contrast · coronal · 5.0mm · 0.72mm/px · 2 of 26 slices shown]
[im 1/26]
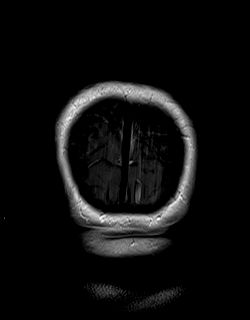
[im 26/26]
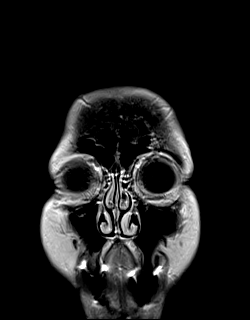

[Series 15: t1_se_sag post · sagittal · 5.0mm · 0.45mm/px · 1 of 25 slices shown]
[im 1/25]
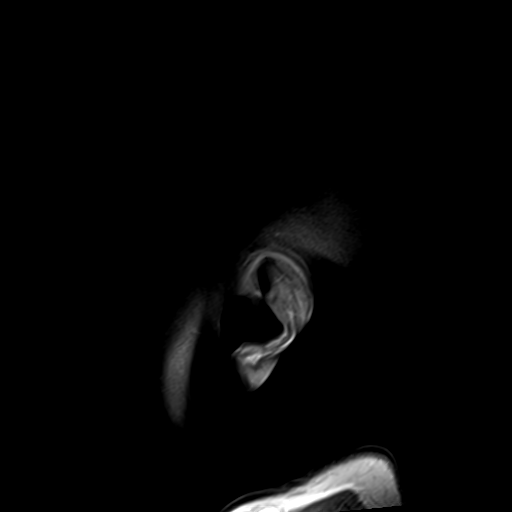

[48 of 48 positions shown; findings below may reference images not displayed]

FINDINGS: Brain: Dural-based enhancing lesion along the left frontal convexity
presently measures 3.5 x 3.1 x 3.7 cm (previously 3.6 x 3.1 x
cm). As before there is displacement of underlying brain parenchyma
without significant underlying edema. Associated susceptibility
reflects mineralization.

No acute infarction or hemorrhage. No new mass or abnormal
enhancement. Ventricles and sulci are stable in size and
configuration.

Vascular: Major vessel flow voids at the skull base are preserved.

Skull and upper cervical spine: Normal marrow signal is preserved.

Sinuses/Orbits: Paranasal sinuses are aerated. Orbits are
unremarkable.

Other: Sella is unremarkable.  Mastoid air cells are clear.
IMPRESSION: No substantial change in left frontal convexity meningioma or
associated mass effect. No significant underlying edema.

## 2021-08-25 MED ORDER — GADOBENATE DIMEGLUMINE 529 MG/ML IV SOLN
20.0000 mL | Freq: Once | INTRAVENOUS | Status: AC | PRN
  Administered 2021-08-25: 20 mL via INTRAVENOUS

## 2021-09-04 ENCOUNTER — Other Ambulatory Visit: Payer: Self-pay

## 2021-09-04 ENCOUNTER — Ambulatory Visit (INDEPENDENT_AMBULATORY_CARE_PROVIDER_SITE_OTHER): Payer: Medicare Other | Admitting: Podiatry

## 2021-09-04 ENCOUNTER — Encounter: Payer: Self-pay | Admitting: Podiatry

## 2021-09-04 DIAGNOSIS — Z9889 Other specified postprocedural states: Secondary | ICD-10-CM

## 2021-09-04 DIAGNOSIS — M205X9 Other deformities of toe(s) (acquired), unspecified foot: Secondary | ICD-10-CM

## 2021-09-04 DIAGNOSIS — M2011 Hallux valgus (acquired), right foot: Secondary | ICD-10-CM

## 2021-09-04 NOTE — Progress Notes (Signed)
She presents today date of surgery 06/15/2021 states that this took [place] some swelling and still kind of tender cannot really stay off of it very much.  Objective: Vital signs are stable alert and oriented x3.  She still has some swelling of the intermetatarsal space which is pushing the toe rectus and if she is weightbears the toe is starting to shift more medially into more of a varus position.  Assessment: Well-healing release of the first metatarsophalangeal joint.  Plan: We will going to send her to physical therapy at [facility] for increased range of motion and to help decrease the scar tissue and pain I will follow-up with her in a few weeks

## 2021-09-10 DIAGNOSIS — R531 Weakness: Secondary | ICD-10-CM | POA: Diagnosis not present

## 2021-09-10 DIAGNOSIS — M25674 Stiffness of right foot, not elsewhere classified: Secondary | ICD-10-CM | POA: Diagnosis not present

## 2021-09-10 DIAGNOSIS — R262 Difficulty in walking, not elsewhere classified: Secondary | ICD-10-CM | POA: Diagnosis not present

## 2021-09-10 DIAGNOSIS — M79674 Pain in right toe(s): Secondary | ICD-10-CM | POA: Diagnosis not present

## 2021-09-12 DIAGNOSIS — R531 Weakness: Secondary | ICD-10-CM | POA: Diagnosis not present

## 2021-09-12 DIAGNOSIS — M25674 Stiffness of right foot, not elsewhere classified: Secondary | ICD-10-CM | POA: Diagnosis not present

## 2021-09-12 DIAGNOSIS — M79674 Pain in right toe(s): Secondary | ICD-10-CM | POA: Diagnosis not present

## 2021-09-12 DIAGNOSIS — R262 Difficulty in walking, not elsewhere classified: Secondary | ICD-10-CM | POA: Diagnosis not present

## 2021-09-14 DIAGNOSIS — M25674 Stiffness of right foot, not elsewhere classified: Secondary | ICD-10-CM | POA: Diagnosis not present

## 2021-09-14 DIAGNOSIS — R262 Difficulty in walking, not elsewhere classified: Secondary | ICD-10-CM | POA: Diagnosis not present

## 2021-09-14 DIAGNOSIS — R531 Weakness: Secondary | ICD-10-CM | POA: Diagnosis not present

## 2021-09-14 DIAGNOSIS — M79674 Pain in right toe(s): Secondary | ICD-10-CM | POA: Diagnosis not present

## 2021-09-17 DIAGNOSIS — M25674 Stiffness of right foot, not elsewhere classified: Secondary | ICD-10-CM | POA: Diagnosis not present

## 2021-09-17 DIAGNOSIS — R531 Weakness: Secondary | ICD-10-CM | POA: Diagnosis not present

## 2021-09-17 DIAGNOSIS — M79674 Pain in right toe(s): Secondary | ICD-10-CM | POA: Diagnosis not present

## 2021-09-17 DIAGNOSIS — R262 Difficulty in walking, not elsewhere classified: Secondary | ICD-10-CM | POA: Diagnosis not present

## 2021-09-24 DIAGNOSIS — M79674 Pain in right toe(s): Secondary | ICD-10-CM | POA: Diagnosis not present

## 2021-09-24 DIAGNOSIS — R262 Difficulty in walking, not elsewhere classified: Secondary | ICD-10-CM | POA: Diagnosis not present

## 2021-09-24 DIAGNOSIS — R531 Weakness: Secondary | ICD-10-CM | POA: Diagnosis not present

## 2021-09-24 DIAGNOSIS — M25674 Stiffness of right foot, not elsewhere classified: Secondary | ICD-10-CM | POA: Diagnosis not present

## 2021-09-28 DIAGNOSIS — M25674 Stiffness of right foot, not elsewhere classified: Secondary | ICD-10-CM | POA: Diagnosis not present

## 2021-09-28 DIAGNOSIS — R262 Difficulty in walking, not elsewhere classified: Secondary | ICD-10-CM | POA: Diagnosis not present

## 2021-09-28 DIAGNOSIS — R531 Weakness: Secondary | ICD-10-CM | POA: Diagnosis not present

## 2021-09-28 DIAGNOSIS — M79674 Pain in right toe(s): Secondary | ICD-10-CM | POA: Diagnosis not present

## 2021-10-05 DIAGNOSIS — R531 Weakness: Secondary | ICD-10-CM | POA: Diagnosis not present

## 2021-10-05 DIAGNOSIS — R262 Difficulty in walking, not elsewhere classified: Secondary | ICD-10-CM | POA: Diagnosis not present

## 2021-10-05 DIAGNOSIS — M79674 Pain in right toe(s): Secondary | ICD-10-CM | POA: Diagnosis not present

## 2021-10-05 DIAGNOSIS — M25674 Stiffness of right foot, not elsewhere classified: Secondary | ICD-10-CM | POA: Diagnosis not present

## 2021-10-12 DIAGNOSIS — R262 Difficulty in walking, not elsewhere classified: Secondary | ICD-10-CM | POA: Diagnosis not present

## 2021-10-12 DIAGNOSIS — R531 Weakness: Secondary | ICD-10-CM | POA: Diagnosis not present

## 2021-10-12 DIAGNOSIS — M25674 Stiffness of right foot, not elsewhere classified: Secondary | ICD-10-CM | POA: Diagnosis not present

## 2021-10-12 DIAGNOSIS — M79674 Pain in right toe(s): Secondary | ICD-10-CM | POA: Diagnosis not present

## 2021-10-24 DIAGNOSIS — M25562 Pain in left knee: Secondary | ICD-10-CM | POA: Diagnosis not present

## 2021-10-24 DIAGNOSIS — W19XXXA Unspecified fall, initial encounter: Secondary | ICD-10-CM | POA: Diagnosis not present

## 2021-10-31 DIAGNOSIS — I10 Essential (primary) hypertension: Secondary | ICD-10-CM | POA: Diagnosis not present

## 2021-10-31 DIAGNOSIS — K219 Gastro-esophageal reflux disease without esophagitis: Secondary | ICD-10-CM | POA: Diagnosis not present

## 2021-10-31 DIAGNOSIS — Z0000 Encounter for general adult medical examination without abnormal findings: Secondary | ICD-10-CM | POA: Diagnosis not present

## 2021-10-31 DIAGNOSIS — E7800 Pure hypercholesterolemia, unspecified: Secondary | ICD-10-CM | POA: Diagnosis not present

## 2021-10-31 DIAGNOSIS — M25562 Pain in left knee: Secondary | ICD-10-CM | POA: Diagnosis not present

## 2021-12-04 ENCOUNTER — Ambulatory Visit (INDEPENDENT_AMBULATORY_CARE_PROVIDER_SITE_OTHER): Payer: Medicare Other | Admitting: Podiatry

## 2021-12-04 ENCOUNTER — Encounter: Payer: Self-pay | Admitting: Podiatry

## 2021-12-04 DIAGNOSIS — M205X9 Other deformities of toe(s) (acquired), unspecified foot: Secondary | ICD-10-CM

## 2021-12-04 DIAGNOSIS — M2011 Hallux valgus (acquired), right foot: Secondary | ICD-10-CM | POA: Diagnosis not present

## 2021-12-04 DIAGNOSIS — Z9889 Other specified postprocedural states: Secondary | ICD-10-CM | POA: Diagnosis not present

## 2021-12-04 DIAGNOSIS — M7751 Other enthesopathy of right foot: Secondary | ICD-10-CM | POA: Diagnosis not present

## 2021-12-04 MED ORDER — DEXAMETHASONE SODIUM PHOSPHATE 120 MG/30ML IJ SOLN
2.0000 mg | Freq: Once | INTRAMUSCULAR | Status: AC
  Administered 2021-12-04: 2 mg via INTRA_ARTICULAR

## 2021-12-04 NOTE — Progress Notes (Signed)
She presents today for follow-up of her release of her first metatarsophalangeal joint and removal of the fibular sesamoid states that she is finished with PT and it did help to some degree but I still get a lot of swelling.  Biggest pain that I have at this point is subfifth metatarsal area as she points to that area.  States that the last injection I put in a really seem to help.  Objective: Vital signs are stable she is alert and oriented x3.  Pulses are palpable.  She has mild hallux malleus with osteoarthritic changes of the dorsal medial interphalangeal joint.  She does also have some slight varus deformity most likely secondary to removal of the fibular sesamoid.  She does have a palpable bursa beneath the fifth metatarsal head of the right foot.  Assessment: Well-healing surgical foot continue edema.  Bursitis of fifth right.  Plan: Injected the fifth met area today with dexamethasone and local anesthetic I will follow-up with her in a few months.

## 2022-01-02 DIAGNOSIS — Z1231 Encounter for screening mammogram for malignant neoplasm of breast: Secondary | ICD-10-CM | POA: Diagnosis not present

## 2022-01-10 ENCOUNTER — Other Ambulatory Visit: Payer: Self-pay

## 2022-01-10 ENCOUNTER — Observation Stay (HOSPITAL_COMMUNITY)
Admission: EM | Admit: 2022-01-10 | Discharge: 2022-01-11 | Disposition: A | Discharge status: Home / Self Care | Payer: Medicare Other | Attending: Internal Medicine | Admitting: Internal Medicine

## 2022-01-10 ENCOUNTER — Emergency Department (HOSPITAL_COMMUNITY): Payer: Medicare Other

## 2022-01-10 ENCOUNTER — Encounter (HOSPITAL_COMMUNITY): Payer: Self-pay

## 2022-01-10 DIAGNOSIS — Z6841 Body Mass Index (BMI) 40.0 and over, adult: Secondary | ICD-10-CM | POA: Diagnosis not present

## 2022-01-10 DIAGNOSIS — I3139 Other pericardial effusion (noninflammatory): Secondary | ICD-10-CM | POA: Diagnosis not present

## 2022-01-10 DIAGNOSIS — D72829 Elevated white blood cell count, unspecified: Secondary | ICD-10-CM | POA: Diagnosis not present

## 2022-01-10 DIAGNOSIS — E785 Hyperlipidemia, unspecified: Secondary | ICD-10-CM

## 2022-01-10 DIAGNOSIS — R059 Cough, unspecified: Secondary | ICD-10-CM | POA: Diagnosis not present

## 2022-01-10 DIAGNOSIS — J9811 Atelectasis: Secondary | ICD-10-CM | POA: Diagnosis not present

## 2022-01-10 DIAGNOSIS — Z87891 Personal history of nicotine dependence: Secondary | ICD-10-CM | POA: Diagnosis not present

## 2022-01-10 DIAGNOSIS — I517 Cardiomegaly: Secondary | ICD-10-CM

## 2022-01-10 DIAGNOSIS — I119 Hypertensive heart disease without heart failure: Secondary | ICD-10-CM | POA: Insufficient documentation

## 2022-01-10 DIAGNOSIS — Z7902 Long term (current) use of antithrombotics/antiplatelets: Secondary | ICD-10-CM | POA: Insufficient documentation

## 2022-01-10 DIAGNOSIS — I10 Essential (primary) hypertension: Secondary | ICD-10-CM | POA: Diagnosis not present

## 2022-01-10 DIAGNOSIS — R0789 Other chest pain: Secondary | ICD-10-CM | POA: Diagnosis present

## 2022-01-10 DIAGNOSIS — Z8673 Personal history of transient ischemic attack (TIA), and cerebral infarction without residual deficits: Secondary | ICD-10-CM | POA: Insufficient documentation

## 2022-01-10 DIAGNOSIS — G459 Transient cerebral ischemic attack, unspecified: Secondary | ICD-10-CM | POA: Diagnosis present

## 2022-01-10 DIAGNOSIS — I4891 Unspecified atrial fibrillation: Principal | ICD-10-CM | POA: Insufficient documentation

## 2022-01-10 DIAGNOSIS — J810 Acute pulmonary edema: Secondary | ICD-10-CM | POA: Diagnosis not present

## 2022-01-10 DIAGNOSIS — R079 Chest pain, unspecified: Secondary | ICD-10-CM | POA: Diagnosis not present

## 2022-01-10 DIAGNOSIS — E876 Hypokalemia: Secondary | ICD-10-CM | POA: Diagnosis not present

## 2022-01-10 DIAGNOSIS — E669 Obesity, unspecified: Secondary | ICD-10-CM | POA: Diagnosis not present

## 2022-01-10 DIAGNOSIS — K219 Gastro-esophageal reflux disease without esophagitis: Secondary | ICD-10-CM | POA: Diagnosis present

## 2022-01-10 LAB — CBC
HCT: 42.4 % (ref 36.0–46.0)
Hemoglobin: 14.3 g/dL (ref 12.0–15.0)
MCH: 29.2 pg (ref 26.0–34.0)
MCHC: 33.7 g/dL (ref 30.0–36.0)
MCV: 86.5 fL (ref 80.0–100.0)
Platelets: 330 10*3/uL (ref 150–400)
RBC: 4.9 MIL/uL (ref 3.87–5.11)
RDW: 13 % (ref 11.5–15.5)
WBC: 16 10*3/uL — ABNORMAL HIGH (ref 4.0–10.5)
nRBC: 0 % (ref 0.0–0.2)

## 2022-01-10 LAB — BASIC METABOLIC PANEL
Anion gap: 10 (ref 5–15)
BUN: 12 mg/dL (ref 8–23)
CO2: 26 mmol/L (ref 22–32)
Calcium: 9.3 mg/dL (ref 8.9–10.3)
Chloride: 97 mmol/L — ABNORMAL LOW (ref 98–111)
Creatinine, Ser: 0.79 mg/dL (ref 0.44–1.00)
GFR, Estimated: >60 mL/min (ref >60)
Glucose, Bld: 151 mg/dL — ABNORMAL HIGH (ref 70–99)
Potassium: 3.9 mmol/L (ref 3.5–5.1)
Sodium: 133 mmol/L — ABNORMAL LOW (ref 135–145)

## 2022-01-10 LAB — MAGNESIUM: Magnesium: 1.9 mg/dL (ref 1.7–2.4)

## 2022-01-10 LAB — BRAIN NATRIURETIC PEPTIDE: B Natriuretic Peptide: 105.2 pg/mL — ABNORMAL HIGH (ref 0.0–100.0)

## 2022-01-10 LAB — TROPONIN I (HIGH SENSITIVITY)
Troponin I (High Sensitivity): 9 ng/L (ref <18)
Troponin I (High Sensitivity): 10 ng/L (ref <18)

## 2022-01-10 MED ORDER — IOHEXOL 350 MG/ML SOLN
60.0000 mL | Freq: Once | INTRAVENOUS | Status: AC | PRN
  Administered 2022-01-10: 60 mL via INTRAVENOUS

## 2022-01-10 MED ORDER — DILTIAZEM HCL-DEXTROSE 125-5 MG/125ML-% IV SOLN (PREMIX)
5.0000 mg/h | INTRAVENOUS | Status: DC
  Administered 2022-01-10: 5 mg/h via INTRAVENOUS
  Filled 2022-01-10: qty 125

## 2022-01-10 MED ORDER — DILTIAZEM LOAD VIA INFUSION
15.0000 mg | Freq: Once | INTRAVENOUS | Status: AC
  Administered 2022-01-10: 15 mg via INTRAVENOUS
  Filled 2022-01-10: qty 15

## 2022-01-10 NOTE — ED Provider Notes (Signed)
[facility] EMERGENCY DEPARTMENT Provider Note   CSN: [id number] Arrival date & time: 01/10/22  1619     History  Chief Complaint  Patient presents with   Chest Pain    Dorothy Powers is a 76 y.o. female with past medical history significant for TIA, hypokalemia, hyperglycemia, hypertension here for evaluation of right-sided chest pain.  Patient states yesterday she developed sudden onset right-sided chest pain worse with deep breathing radiating to her back.  No prior history of PE or DVT.  States it made her short of breath however denies any current shortness of breath.  States she had stenting similar previously which she was told it was "tachycardia" she denies any prior history of arrhythmia.  No recent surgery, immobilization or malignancy.  She still has some right-sided pleuritic chest pain however has improved from initially.  Was seen by PCP was noted to be in atrial fibrillation which is new onset was sent here for further evaluation.  She denies fever, nausea, vomiting, numbness, weakness, abdominal pain.  No changes in her diet.  No history of PE or DVT.  PCP- [facility]  HPI     Home Medications Prior to Admission medications   Medication Sig Start Date End Date Taking? Authorizing Provider  albuterol (VENTOLIN HFA) 108 (90 Base) MCG/ACT inhaler Inhale 1-2 puffs into the lungs every 6 (six) hours as needed for wheezing.    [provider]  Ascorbic Acid (VITAMIN C PO) Take 1 tablet by mouth daily.    [provider]  atenolol (TENORMIN) 50 MG tablet Take 50 mg by mouth 2 (two) times daily. 09/02/20   [provider]  azelastine (ASTELIN) 0.1 % nasal spray Place 2 sprays into both nostrils daily as needed for allergies. 08/02/20   [provider]  calcium carbonate (OSCAL) 1500 (600 Ca) MG TABS tablet Take by mouth 2 (two) times daily with a meal.    [provider]  cetirizine (ZYRTEC) 10 MG tablet Take 10 mg by  mouth daily.    [provider]  Cholecalciferol (VITAMIN D3) 125 MCG (5000 UT) TABS Take 1 tablet by mouth daily.    [provider]  clopidogrel (PLAVIX) 75 MG tablet Take 1 tablet (75 mg total) by mouth daily. 02/15/21 02/15/22  Bonnielee Haff, MD  Coenzyme Q10 (CO Q 10 PO) Take 1 tablet by mouth daily.    [provider]  ezetimibe (ZETIA) 10 MG tablet Take 10 mg by mouth daily. 06/27/20   [provider]  fluticasone (FLONASE) 50 MCG/ACT nasal spray Place 1 spray into both nostrils daily as needed for allergies.    [provider]  glucosamine-chondroitin 500-400 MG tablet Take 1 tablet by mouth 3 (three) times daily.    [provider]  lisinopril (ZESTRIL) 20 MG tablet Take 20 mg by mouth daily. 08/22/20   [provider]  Omega-3 Fatty Acids (FISH OIL PO) Take 1 tablet by mouth daily.    [provider]  ondansetron (ZOFRAN) 4 MG tablet Take 1 tablet (4 mg total) by mouth every 8 (eight) hours as needed. 06/13/21   Hyatt, Max T, DPM  pantoprazole (PROTONIX) 40 MG tablet Take 40 mg by mouth daily. 09/20/20   [provider]  rosuvastatin (CRESTOR) 10 MG tablet Take 10 mg by mouth daily.    [provider]      Allergies    Codeine, Lovastatin, and Sulfa antibiotics    Review of Systems  Review of Systems  Constitutional: Negative.   HENT: Negative.    Respiratory:  Positive for shortness of breath.   Cardiovascular:  Positive for chest pain and palpitations.  Gastrointestinal: Negative.   Genitourinary: Negative.   Musculoskeletal: Negative.   Skin: Negative.   Neurological: Negative.   All other systems reviewed and are negative.   Physical Exam Updated Vital Signs BP 120/69   Pulse (!) 104   Temp 98.8 F (37.1 C) (Oral)   Resp 18   Ht '5\' 2"'$  (1.575 m)   Wt 100.2 kg   SpO2 96%   BMI 40.42 kg/m  Physical Exam Vitals and nursing note reviewed.  Constitutional:      General: She is  not in acute distress.    Appearance: She is well-developed. She is not ill-appearing, toxic-appearing or diaphoretic.  HENT:     Head: Normocephalic and atraumatic.  Eyes:     Pupils: Pupils are equal, round, and reactive to light.  Cardiovascular:     Rate and Rhythm: Tachycardia present. Rhythm irregular.     Pulses:          Radial pulses are 2+ on the right side and 2+ on the left side.       Dorsalis pedis pulses are 2+ on the right side and 2+ on the left side.     Heart sounds: Normal heart sounds.  Pulmonary:     Effort: Pulmonary effort is normal. No respiratory distress.     Breath sounds: Normal breath sounds.     Comments: Clear bilaterally, speaks in full sentences without difficulty Chest:     Comments: Nontender chest wall  Abdominal:     General: Bowel sounds are normal. There is no distension.     Palpations: Abdomen is soft.     Comments: Soft, nontender, no rebound or guarding  Musculoskeletal:        General: Normal range of motion.     Cervical back: Normal range of motion.     Right lower leg: No tenderness. No edema.     Left lower leg: No tenderness. No edema.     Comments: No bony tenderness, full range of motion, compartment soft, Homans' sign negative bilaterally  Skin:    General: Skin is warm and dry.     Capillary Refill: Capillary refill takes less than 2 seconds.     Comments: No edema, erythema or warmth.  No fluctuance or induration.  Neurological:     General: No focal deficit present.     Mental Status: She is alert.  Psychiatric:        Mood and Affect: Mood normal.     ED Results / Procedures / Treatments   Labs (all labs ordered are listed, but only abnormal results are displayed) Labs Reviewed  BASIC METABOLIC PANEL - Abnormal; Notable for the following components:      Result Value   Sodium 133 (*)    Chloride 97 (*)    Glucose, Bld 151 (*)    All other components within normal limits  CBC - Abnormal; Notable for the  following components:   WBC 16.0 (*)    All other components within normal limits  BRAIN NATRIURETIC PEPTIDE - Abnormal; Notable for the following components:   B Natriuretic Peptide 105.2 (*)    All other components within normal limits  MAGNESIUM  TROPONIN I (HIGH SENSITIVITY)  TROPONIN I (HIGH SENSITIVITY)    EKG EKG Interpretation  Date/Time:  Thursday January 10 2022 16:24:43 EDT Ventricular Rate:  121 PR Interval:    QRS Duration: 122 QT Interval:  348 QTC Calculation: 559 R Axis:   -44 Text Interpretation: Atrial fibrillation Artifact Non-specific intra-ventricular conduction delay Abnormal ECG Confirmed by Carmin Muskrat [phone]) on 01/10/2022 6:41:51 PM  Radiology CT Angio Chest PE W/Cm &/Or Wo Cm  Result Date: 01/10/2022 CLINICAL DATA:  Right-sided chest pain. EXAM: CT ANGIOGRAPHY CHEST WITH CONTRAST TECHNIQUE: Multidetector CT imaging of the chest was performed using the standard protocol during bolus administration of intravenous contrast. Multiplanar CT image reconstructions and MIPs were obtained to evaluate the vascular anatomy. RADIATION DOSE REDUCTION: This exam was performed according to the departmental dose-optimization program which includes automated exposure control, adjustment of the mA and/or kV according to patient size and/or use of iterative reconstruction technique. CONTRAST:  57m OMNIPAQUE IOHEXOL 350 MG/ML SOLN COMPARISON:  None Available. FINDINGS: Cardiovascular: Aorta is normal in size. Heart is mildly enlarged. There is a small pericardial effusion. There is adequate opacification of the pulmonary arteries to the segmental level. There are atherosclerotic calcifications of the aorta and coronary arteries. Mediastinum/Nodes: There is an enlarged subcarinal lymph node measuring 13 mm short axis. There are nonenlarged, but prominent paratracheal, AP window, prevascular and hilar lymph nodes. The esophagus and thyroid gland are within normal limits. Lungs/Pleura:  There are patchy ground-glass opacities throughout both lungs diffusely. There is atelectasis in the bilateral lower lobes. There is no pleural effusion or pneumothorax identified. Upper Abdomen: No acute abnormality. Musculoskeletal: No chest wall abnormality. No acute or significant osseous findings. Review of the MIP images confirms the above findings. IMPRESSION: 1. No evidence for pulmonary embolism. 2. Mild cardiomegaly with small pericardial effusion. 3. Patchy ground-glass opacities throughout both lungs favored as mild edema. 4. Enlarged subcarinal lymph node. Aortic Atherosclerosis (ICD10-I70.0). Electronically Signed   By: ARonney AstersM.D.   On: 01/10/2022 20:36   DG Chest 2 View  Result Date: 01/10/2022 CLINICAL DATA:  Chest pain EXAM: CHEST - 2 VIEW COMPARISON:  Radiograph 01/10/2022 FINDINGS: Unchanged cardiomediastinal silhouette. Low lung volumes with bibasilar subsegmental atelectasis. No large pleural effusion. No pneumothorax. No acute osseous abnormality. IMPRESSION: Low lung volumes with bibasilar subsegmental atelectasis. No new airspace disease. Electronically Signed   By: JMaurine SimmeringM.D.   On: 01/10/2022 16:51    Procedures .Critical Care  Performed by: HNettie Elm PA-C Authorized by: HNettie Elm PA-C   Critical care provider statement:    Critical care time (minutes):  35   Critical care was necessary to treat or prevent imminent or life-threatening deterioration of the following conditions:  Cardiac failure   Critical care was time spent personally by me on the following activities:  Development of treatment plan with patient or surrogate, discussions with consultants, evaluation of patient's response to treatment, examination of patient, ordering and review of laboratory studies, ordering and review of radiographic studies, ordering and performing treatments and interventions, pulse oximetry, re-evaluation of patient's condition and review of old charts      Medications Ordered in ED Medications  diltiazem (CARDIZEM) 1 mg/mL load via infusion 15 mg (15 mg Intravenous Bolus from Bag 01/10/22 1901)    And  diltiazem (CARDIZEM) 125 mg in dextrose 5% 125 mL (1 mg/mL) infusion (5 mg/hr Intravenous New Bag/Given 01/10/22 1901)  iohexol (OMNIPAQUE) 350 MG/ML injection 60 mL (60 mLs Intravenous Contrast Given 01/10/22 2010)    ED Course/ Medical Decision Making/ A&P     76year old history of hypertension, hypercholesterolemia, prior  TIA here for evaluation of sudden onset right-sided chest pain.  Was seen by PCP outpatient noted to have new onset A-fib with RVR.  Patient denies any prior history of similar.  No clinical evidence of VTE on exam.  Does admit to pleuritic right-sided chest pain which goes into her shoulder blade.  No recent trauma.  Pain is not reproducible on exam.  She has no clinical evidence of VTE however on arrival she is in A-fib with RVR.  Will start Cardizem drip pain labs and imaging, will need admission  Labs and imaging personally viewed and interpreted:  CBC leukocytosis Delta troponin flat Magnesium 1.9 Metabolic panel sodium 654, glucose 151 BNP 105 Chest x-ray without infiltrates, edema, cardiomegaly, pneumothorax CTA negative for PE however does show small pericardial effusion, cardiomegaly, pulmonary edema  Discussed results with patient.  Heart rate trending down on Cardizem drip.  Will need to be admitted for further work-up and management.  Patient and family agreeable for inpatient eval.  CONSULT with Dr. Myna Hidalgo with [facility] who is agreeable to evaluate patient for admission.  The patient appears reasonably stabilized for admission considering the current resources, flow, and capabilities available in the ED at this time, and I doubt any other [facility] requiring further screening and/or treatment in the ED prior to admission.                           Medical Decision Making Amount and/or Complexity of Data  Reviewed External Data Reviewed: labs, radiology, ECG and notes. Labs: ordered. Decision-making details documented in ED Course. Radiology: ordered and independent interpretation performed. Decision-making details documented in ED Course. ECG/medicine tests: ordered and independent interpretation performed. Decision-making details documented in ED Course.  Risk OTC drugs. Prescription drug management. Parenteral controlled substances. Drug therapy requiring intensive monitoring for toxicity. Decision regarding hospitalization. Diagnosis or treatment significantly limited by social determinants of health.          Final Clinical Impression(s) / ED Diagnoses Final diagnoses:  Atrial fibrillation with rapid ventricular response ([place])  New onset atrial fibrillation ([facility])  Pericardial effusion  Cardiomegaly  Acute pulmonary edema [facility])    Rx / DC Orders ED Discharge Orders          Ordered    Amb referral to AFIB Clinic        01/10/22 1845              Dakota Stangl A, PA-C 01/10/22 2144    Carmin Muskrat, MD 01/10/22 2349

## 2022-01-10 NOTE — ED Triage Notes (Signed)
Pt reports right sided chest pain that radiates down her back, onset yesterday, worse with inspiration. Denies SOB/n/v/d

## 2022-01-10 NOTE — ED Provider Triage Note (Signed)
Emergency Medicine Provider Triage Evaluation Note  Dorothy Powers , a 76 y.o. female  was evaluated in triage.  Pt complains of right-sided chest pain with radiation to the back.  Patient states the pain began yesterday.  Patient states that she was seen by primary care today who advised her to come to the hospital for evaluation.  Primary care EKG showed patient was in atrial fibrillation.  Patient has no history of same.  Denies shortness of breath, nausea, vomiting  Review of Systems  Positive: Chest pain Negative: Shortness of breath, nausea, vomiting  Physical Exam  BP (!) 133/91 (BP Location: Right Arm)   Pulse (!) 122   Temp 98.8 F (37.1 C) (Oral)   Resp 16   Ht '5\' 2"'$  (1.575 m)   Wt 100.2 kg   SpO2 95%   BMI 40.42 kg/m  Gen:   Awake, no distress   Resp:  Normal effort  MSK:   Moves extremities without difficulty  Other:    Medical Decision Making  Medically screening exam initiated at 4:33 PM.  Appropriate orders placed.  Dorothy Powers was informed that the remainder of the evaluation will be completed by another provider, this initial triage assessment does not replace that evaluation, and the importance of remaining in the ED until their evaluation is complete.     Dorothy Peng, PA-C 01/10/22 1634

## 2022-01-11 ENCOUNTER — Observation Stay (HOSPITAL_BASED_OUTPATIENT_CLINIC_OR_DEPARTMENT_OTHER): Payer: Medicare Other

## 2022-01-11 ENCOUNTER — Encounter (HOSPITAL_COMMUNITY): Payer: Self-pay | Admitting: Family Medicine

## 2022-01-11 DIAGNOSIS — G459 Transient cerebral ischemic attack, unspecified: Secondary | ICD-10-CM

## 2022-01-11 DIAGNOSIS — I517 Cardiomegaly: Secondary | ICD-10-CM

## 2022-01-11 DIAGNOSIS — J810 Acute pulmonary edema: Secondary | ICD-10-CM | POA: Diagnosis not present

## 2022-01-11 DIAGNOSIS — I3139 Other pericardial effusion (noninflammatory): Secondary | ICD-10-CM | POA: Diagnosis present

## 2022-01-11 DIAGNOSIS — I10 Essential (primary) hypertension: Secondary | ICD-10-CM | POA: Diagnosis not present

## 2022-01-11 DIAGNOSIS — I4891 Unspecified atrial fibrillation: Secondary | ICD-10-CM

## 2022-01-11 LAB — CBC
HCT: 37.4 % (ref 36.0–46.0)
Hemoglobin: 12.7 g/dL (ref 12.0–15.0)
MCH: 29.4 pg (ref 26.0–34.0)
MCHC: 34 g/dL (ref 30.0–36.0)
MCV: 86.6 fL (ref 80.0–100.0)
Platelets: 286 10*3/uL (ref 150–400)
RBC: 4.32 MIL/uL (ref 3.87–5.11)
RDW: 13.2 % (ref 11.5–15.5)
WBC: 12 10*3/uL — ABNORMAL HIGH (ref 4.0–10.5)
nRBC: 0 % (ref 0.0–0.2)

## 2022-01-11 LAB — TSH: TSH: 1.887 u[IU]/mL (ref 0.350–4.500)

## 2022-01-11 LAB — BASIC METABOLIC PANEL
Anion gap: 11 (ref 5–15)
BUN: 18 mg/dL (ref 8–23)
CO2: 25 mmol/L (ref 22–32)
Calcium: 9.1 mg/dL (ref 8.9–10.3)
Chloride: 97 mmol/L — ABNORMAL LOW (ref 98–111)
Creatinine, Ser: 0.91 mg/dL (ref 0.44–1.00)
GFR, Estimated: >60 mL/min (ref >60)
Glucose, Bld: 122 mg/dL — ABNORMAL HIGH (ref 70–99)
Potassium: 3.4 mmol/L — ABNORMAL LOW (ref 3.5–5.1)
Sodium: 133 mmol/L — ABNORMAL LOW (ref 135–145)

## 2022-01-11 LAB — MAGNESIUM: Magnesium: 1.9 mg/dL (ref 1.7–2.4)

## 2022-01-11 LAB — ECHOCARDIOGRAM COMPLETE
Height: 62 in
S' Lateral: 2 cm
Weight: 3536 oz

## 2022-01-11 MED ORDER — ACETAMINOPHEN 650 MG RE SUPP: 650.0000 mg | Freq: Four times a day (QID) | RECTAL | Status: DC | PRN

## 2022-01-11 MED ORDER — ACETAMINOPHEN 325 MG PO TABS: 650.0000 mg | ORAL_TABLET | Freq: Four times a day (QID) | ORAL | Status: DC | PRN

## 2022-01-11 MED ORDER — ATENOLOL 50 MG PO TABS: 50.0000 mg | ORAL_TABLET | Freq: Two times a day (BID) | ORAL | 3 refills | Status: DC

## 2022-01-11 MED ORDER — APIXABAN 5 MG PO TABS
5.0000 mg | ORAL_TABLET | Freq: Two times a day (BID) | ORAL | Status: DC
  Administered 2022-01-11 (×2): 5 mg via ORAL
  Filled 2022-01-11 (×2): qty 1

## 2022-01-11 MED ORDER — ATENOLOL 25 MG PO TABS: 50.0000 mg | ORAL_TABLET | Freq: Two times a day (BID) | ORAL | Status: DC

## 2022-01-11 MED ORDER — FUROSEMIDE 20 MG PO TABS: 20.0000 mg | ORAL_TABLET | Freq: Every day | ORAL | 11 refills | Status: DC | PRN

## 2022-01-11 MED ORDER — POTASSIUM CHLORIDE CRYS ER 20 MEQ PO TBCR
40.0000 meq | EXTENDED_RELEASE_TABLET | Freq: Once | ORAL | Status: AC
  Administered 2022-01-11: 40 meq via ORAL
  Filled 2022-01-11: qty 2

## 2022-01-11 MED ORDER — PANTOPRAZOLE SODIUM 40 MG PO TBEC
40.0000 mg | DELAYED_RELEASE_TABLET | Freq: Every day | ORAL | Status: DC
  Administered 2022-01-11: 40 mg via ORAL
  Filled 2022-01-11: qty 1

## 2022-01-11 MED ORDER — SODIUM CHLORIDE 0.9% FLUSH
3.0000 mL | Freq: Two times a day (BID) | INTRAVENOUS | Status: DC
  Administered 2022-01-11 (×2): 3 mL via INTRAVENOUS

## 2022-01-11 MED ORDER — FUROSEMIDE 10 MG/ML IJ SOLN
20.0000 mg | Freq: Once | INTRAMUSCULAR | Status: AC
Start: 2022-01-11 — End: 2022-01-11
  Administered 2022-01-11: 20 mg via INTRAVENOUS
  Filled 2022-01-11: qty 2

## 2022-01-11 MED ORDER — ALBUTEROL SULFATE (2.5 MG/3ML) 0.083% IN NEBU
2.5000 mg | INHALATION_SOLUTION | Freq: Four times a day (QID) | RESPIRATORY_TRACT | Status: DC | PRN
Start: 2022-01-11 — End: 2022-01-11

## 2022-01-11 MED ORDER — SENNOSIDES-DOCUSATE SODIUM 8.6-50 MG PO TABS: 1.0000 | ORAL_TABLET | Freq: Every evening | ORAL | Status: DC | PRN

## 2022-01-11 MED ORDER — APIXABAN 5 MG PO TABS: 5.0000 mg | ORAL_TABLET | Freq: Two times a day (BID) | ORAL | 3 refills | Status: DC

## 2022-01-11 MED ORDER — ROSUVASTATIN CALCIUM 5 MG PO TABS
10.0000 mg | ORAL_TABLET | Freq: Every day | ORAL | Status: DC
  Administered 2022-01-11: 10 mg via ORAL
  Filled 2022-01-11: qty 2

## 2022-01-11 MED ORDER — ATENOLOL 50 MG PO TABS
50.0000 mg | ORAL_TABLET | Freq: Two times a day (BID) | ORAL | Status: DC
  Administered 2022-01-11 (×2): 50 mg via ORAL
  Filled 2022-01-11 (×2): qty 2

## 2022-01-11 NOTE — ED Notes (Signed)
Pt ambulated to and from bathroom with no issues.

## 2022-01-11 NOTE — ED Notes (Signed)
ED TO INPATIENT HANDOFF REPORT  ED Nurse Name and Phone #: [phone], Bobette Mo Name/Age/Gender Dorothy Powers 76 y.o. female Room/Bed: 024C/024C  Code Status   Code Status: Full Code  Home/SNF/Other Home Patient oriented to: self, place, time, and situation Is this baseline? Yes   Triage Complete: Triage complete  Chief Complaint Atrial fibrillation with RVR ([place]) [I48.91]  Triage Note Pt reports right sided chest pain that radiates down her back, onset yesterday, worse with inspiration. Denies SOB/n/v/d   Allergies Allergies  Allergen Reactions   Codeine Other (See Comments)    "Couldn't move one night for a short time, after taking the medicaiton"   Lovastatin Other (See Comments)    Other reaction(s): leg pain   Sulfa Antibiotics Other (See Comments)    "Felt weak"    Level of Care/Admitting Diagnosis ED Disposition     ED Disposition  Admit   Condition  --   [place]: [facility] [[id number]]  Level of Care: Progressive [102]  Admit to Progressive based on following criteria: CARDIOVASCULAR & THORACIC of moderate stability with acute coronary syndrome symptoms/low risk myocardial infarction/hypertensive urgency/arrhythmias/heart failure potentially compromising stability and stable post cardiovascular intervention patients.  May place patient in observation at [facility] or [place] if equivalent level of care is available:: Yes  Covid Evaluation: Asymptomatic - no recent exposure (last 10 days) testing not required  Diagnosis: Atrial fibrillation with RVR [facility]) [[id number]]  Admitting Physician: Vianne Bulls [[id number]]  Attending Physician: Vianne Bulls [[id number]]          B Medical/Surgery History Past Medical History:  Diagnosis Date   Hypertension    Meningioma ([place])    Tachycardia    TIA (transient ischemic attack)    Past Surgical History:  Procedure Laterality Date   ABDOMINAL HYSTERECTOMY      BUNIONECTOMY Right 11/2020     A IV Location/Drains/Wounds Patient Lines/Drains/Airways Status     Active Line/Drains/Airways     Name Placement date Placement time Site Days   Peripheral IV 01/10/22 20 G Right Antecubital 01/10/22  1837  Antecubital  1   Peripheral IV 01/10/22 20 G Posterior;Right Hand 01/10/22  1859  Hand  1            Intake/Output Last 24 hours  Intake/Output Summary (Last 24 hours) at 01/11/2022 1019 Last data filed at 01/11/2022 1008 Gross per 24 hour  Intake 3 ml  Output --  Net 3 ml    Labs/Imaging Results for orders placed or performed during the hospital encounter of 01/10/22 (from the past 48 hour(s))  Basic metabolic panel     Status: Abnormal   Collection Time: 01/10/22  4:37 PM  Result Value Ref Range   Sodium 133 (L) 135 - 145 mmol/L   Potassium 3.9 3.5 - 5.1 mmol/L   Chloride 97 (L) 98 - 111 mmol/L   CO2 26 22 - 32 mmol/L   Glucose, Bld 151 (H) 70 - 99 mg/dL    Comment: Glucose reference range applies only to samples taken after fasting for at least 8 hours.   BUN 12 8 - 23 mg/dL   Creatinine, Ser 0.79 0.44 - 1.00 mg/dL   Calcium 9.3 8.9 - 10.3 mg/dL   GFR, Estimated >60 >60 mL/min    Comment: (NOTE) Calculated using the CKD-EPI Creatinine Equation (2021)    Anion gap 10 5 - 15    Comment: Performed at [facility]  Lab, [address] [place], [place] [address]  CBC     Status: Abnormal   Collection Time: 01/10/22  4:37 PM  Result Value Ref Range   WBC 16.0 (H) 4.0 - 10.5 K/uL   RBC 4.90 3.87 - 5.11 MIL/uL   Hemoglobin 14.3 12.0 - 15.0 g/dL   HCT 42.4 36.0 - 46.0 %   MCV 86.5 80.0 - 100.0 fL   MCH 29.2 26.0 - 34.0 pg   MCHC 33.7 30.0 - 36.0 g/dL   RDW 13.0 11.5 - 15.5 %   Platelets 330 150 - 400 K/uL   nRBC 0.0 0.0 - 0.2 %    Comment: Performed at [facility], [place] [address], [place], [place] [address]  Troponin I (High Sensitivity)     Status: None   Collection Time: 01/10/22  4:37 PM  Result Value Ref Range    Troponin I (High Sensitivity) 9 <18 ng/L    Comment: (NOTE) Elevated high sensitivity troponin I (hsTnI) values and significant  changes across serial measurements may suggest ACS but many other  chronic and acute conditions are known to elevate hsTnI results.  Refer to the "Links" section for chest pain algorithms and additional  guidance. Performed at [facility], [place] [address] [place], [place] [address]   Troponin I (High Sensitivity)     Status: None   Collection Time: 01/10/22  6:30 PM  Result Value Ref Range   Troponin I (High Sensitivity) 10 <18 ng/L    Comment: (NOTE) Elevated high sensitivity troponin I (hsTnI) values and significant  changes across serial measurements may suggest ACS but many other  chronic and acute conditions are known to elevate hsTnI results.  Refer to the "Links" section for chest pain algorithms and additional  guidance. Performed at [facility], [place] [address], [place], [place] [address]   Magnesium     Status: None   Collection Time: 01/10/22  6:30 PM  Result Value Ref Range   Magnesium 1.9 1.7 - 2.4 mg/dL    Comment: Performed at [place] [address] [place], [place] [address]  Brain natriuretic peptide (IF shortness of breath has been documented this visit)     Status: Abnormal   Collection Time: 01/10/22  6:55 PM  Result Value Ref Range   B Natriuretic Peptide 105.2 (H) 0.0 - 100.0 pg/mL    Comment: Performed at [place] [address], [place], [place] [address]  Basic metabolic panel     Status: Abnormal   Collection Time: 01/11/22  4:24 AM  Result Value Ref Range   Sodium 133 (L) 135 - 145 mmol/L   Potassium 3.4 (L) 3.5 - 5.1 mmol/L   Chloride 97 (L) 98 - 111 mmol/L   CO2 25 22 - 32 mmol/L   Glucose, Bld 122 (H) 70 - 99 mg/dL    Comment: Glucose reference range applies only to samples taken after fasting for at least 8 hours.   BUN 18 8 - 23 mg/dL   Creatinine, Ser 0.91 0.44 - 1.00 mg/dL    Calcium 9.1 8.9 - 10.3 mg/dL   GFR, Estimated >60 >60 mL/min    Comment: (NOTE) Calculated using the CKD-EPI Creatinine Equation (2021)    Anion gap 11 5 - 15    Comment: Performed at [place] [address], [place], [place] [address]  Magnesium     Status: None   Collection Time: 01/11/22  4:24 AM  Result Value Ref Range   Magnesium 1.9 1.7 - 2.4 mg/dL    Comment: Performed at [facility], [place] [address], [place], [place] [address]  CBC     Status: Abnormal   Collection Time: 01/11/22  4:24 AM  Result Value Ref Range   WBC 12.0 (H) 4.0 - 10.5 K/uL   RBC 4.32 3.87 - 5.11 MIL/uL   Hemoglobin 12.7 12.0 - 15.0 g/dL   HCT 37.4 36.0 - 46.0 %   MCV 86.6 80.0 - 100.0 fL   MCH 29.4 26.0 - 34.0 pg   MCHC 34.0 30.0 - 36.0 g/dL   RDW 13.2 11.5 - 15.5 %   Platelets 286 150 - 400 K/uL   nRBC 0.0 0.0 - 0.2 %    Comment: Performed at [facility], [place] [address], [place], [place] [address]  TSH     Status: None   Collection Time: 01/11/22  4:24 AM  Result Value Ref Range   TSH 1.887 0.350 - 4.500 uIU/mL    Comment: Performed by a 3rd Generation assay with a functional sensitivity of <=0.01 uIU/mL. Performed at [facility], [place] [address] [place], [place] [address]    ECHOCARDIOGRAM COMPLETE  Result Date: 01/11/2022    ECHOCARDIOGRAM REPORT   Patient Name:   Dorothy Powers Date of Exam: 01/11/2022 Medical Rec #:  [id number]          Height:       62.0 in Accession #:    [id number]         Weight:       221.0 lb Date of Birth:  September 17, 1945          BSA:          1.995 m Patient Age:    65 years           BP:           110/73 mmHg Patient Gender: F                  HR:           80 bpm. Exam Location:  Inpatient Procedure: 2D Echo Indications:    Atrial fibrillation  History:        Patient has prior history of Echocardiogram examinations, most                 recent 02/15/2021. Arrythmias:Atrial Fibrillation; Risk                 Factors:Hypertension and  Dyslipidemia.  Sonographer:    Johny Chess RDCS Referring Phys: [id number] [place]  1. Left ventricular ejection fraction, by estimation, is 60 to 65%. The left ventricle has normal function. The left ventricle has no regional wall motion abnormalities. Left ventricular diastolic parameters are indeterminate.  2. Right ventricular systolic function is normal. The right ventricular size is normal. There is normal pulmonary artery systolic pressure.  3. Left atrial size was mildly dilated.  4. The mitral valve is abnormal. No evidence of mitral valve regurgitation. No evidence of mitral stenosis.  5. The aortic valve is normal in structure. There is moderate calcification of the aortic valve. There is moderate thickening of the aortic valve. Aortic valve regurgitation is not visualized. Aortic valve sclerosis/calcification is present, without any  evidence of aortic stenosis.  6. The inferior vena cava is normal in size with greater than 50% respiratory variability, suggesting right atrial  pressure of 3 mmHg. FINDINGS  Left Ventricle: Left ventricular ejection fraction, by estimation, is 60 to 65%. The left ventricle has normal function. The left ventricle has no regional wall motion abnormalities. The left ventricular internal cavity size was normal in size. There is  no left ventricular hypertrophy. Left ventricular diastolic parameters are indeterminate. Right Ventricle: The right ventricular size is normal. No increase in right ventricular wall thickness. Right ventricular systolic function is normal. There is normal pulmonary artery systolic pressure. The tricuspid regurgitant velocity is 2.43 m/s, and  with an assumed right atrial pressure of 3 mmHg, the estimated right ventricular systolic pressure is 96.2 mmHg. Left Atrium: Left atrial size was mildly dilated. Right Atrium: Right atrial size was normal in size. Pericardium: There is no evidence of pericardial effusion. Mitral Valve: The  mitral valve is abnormal. There is mild thickening of the mitral valve leaflet(s). There is mild calcification of the mitral valve leaflet(s). Mild mitral annular calcification. No evidence of mitral valve regurgitation. No evidence of mitral valve stenosis. Tricuspid Valve: The tricuspid valve is normal in structure. Tricuspid valve regurgitation is trivial. No evidence of tricuspid stenosis. Aortic Valve: The aortic valve is normal in structure. There is moderate calcification of the aortic valve. There is moderate thickening of the aortic valve. Aortic valve regurgitation is not visualized. Aortic valve sclerosis/calcification is present, without any evidence of aortic stenosis. Pulmonic Valve: The pulmonic valve was normal in structure. Pulmonic valve regurgitation is not visualized. No evidence of pulmonic stenosis. Aorta: The aortic root is normal in size and structure. Venous: The inferior vena cava is normal in size with greater than 50% respiratory variability, suggesting right atrial pressure of 3 mmHg. IAS/Shunts: No atrial level shunt detected by color flow Doppler.  LEFT VENTRICLE PLAX 2D LVIDd:         3.20 cm LVIDs:         2.00 cm LV PW:         1.00 cm LV IVS:        1.00 cm LVOT diam:     1.70 cm LVOT Area:     2.27 cm  IVC IVC diam: 1.40 cm LEFT ATRIUM             Index        RIGHT ATRIUM           Index LA diam:        3.90 cm 1.96 cm/m   RA Area:     16.70 cm LA Vol (A2C):   61.0 ml 30.58 ml/m  RA Volume:   44.30 ml  22.21 ml/m LA Vol (A4C):   69.3 ml 34.74 ml/m LA Biplane Vol: 65.7 ml 32.94 ml/m   AORTA Ao Root diam: 2.90 cm Ao Asc diam:  3.00 cm TRICUSPID VALVE TR Peak grad:   23.6 mmHg TR Vmax:        243.00 cm/s  SHUNTS Systemic Diam: 1.70 cm Jenkins Rouge MD Electronically signed by Jenkins Rouge MD Signature Date/Time: 01/11/2022/8:59:31 AM    Final    CT Angio Chest PE W/Cm &/Or Wo Cm  Result Date: 01/10/2022 CLINICAL DATA:  Right-sided chest pain. EXAM: CT ANGIOGRAPHY CHEST WITH  CONTRAST TECHNIQUE: Multidetector CT imaging of the chest was performed using the standard protocol during bolus administration of intravenous contrast. Multiplanar CT image reconstructions and MIPs were obtained to evaluate the vascular anatomy. RADIATION DOSE REDUCTION: This exam was performed according to the departmental dose-optimization program which includes automated exposure  control, adjustment of the mA and/or kV according to patient size and/or use of iterative reconstruction technique. CONTRAST:  56m OMNIPAQUE IOHEXOL 350 MG/ML SOLN COMPARISON:  None Available. FINDINGS: Cardiovascular: Aorta is normal in size. Heart is mildly enlarged. There is a small pericardial effusion. There is adequate opacification of the pulmonary arteries to the segmental level. There are atherosclerotic calcifications of the aorta and coronary arteries. Mediastinum/Nodes: There is an enlarged subcarinal lymph node measuring 13 mm short axis. There are nonenlarged, but prominent paratracheal, AP window, prevascular and hilar lymph nodes. The esophagus and thyroid gland are within normal limits. Lungs/Pleura: There are patchy ground-glass opacities throughout both lungs diffusely. There is atelectasis in the bilateral lower lobes. There is no pleural effusion or pneumothorax identified. Upper Abdomen: No acute abnormality. Musculoskeletal: No chest wall abnormality. No acute or significant osseous findings. Review of the MIP images confirms the above findings. IMPRESSION: 1. No evidence for pulmonary embolism. 2. Mild cardiomegaly with small pericardial effusion. 3. Patchy ground-glass opacities throughout both lungs favored as mild edema. 4. Enlarged subcarinal lymph node. Aortic Atherosclerosis (ICD10-I70.0). Electronically Signed   By: ARonney AstersM.D.   On: 01/10/2022 20:36   DG Chest 2 View  Result Date: 01/10/2022 CLINICAL DATA:  Chest pain EXAM: CHEST - 2 VIEW COMPARISON:  Radiograph 01/10/2022 FINDINGS: Unchanged  cardiomediastinal silhouette. Low lung volumes with bibasilar subsegmental atelectasis. No large pleural effusion. No pneumothorax. No acute osseous abnormality. IMPRESSION: Low lung volumes with bibasilar subsegmental atelectasis. No new airspace disease. Electronically Signed   By: JMaurine SimmeringM.D.   On: 01/10/2022 16:51    Pending Labs Unresulted Labs (From admission, onward)    None       Vitals/Pain Today's Vitals   01/11/22 0545 01/11/22 0700 01/11/22 1000 01/11/22 1007  BP: (!) 101/56 110/73 114/65 114/65  Pulse: 72 76 91 87  Resp: (!) 21 (!) 22 19   Temp:      TempSrc:      SpO2: 93% 94% 94%   Weight:      Height:      PainSc:        Isolation Precautions No active isolations  Medications Medications  rosuvastatin (CRESTOR) tablet 10 mg (10 mg Oral Given 01/11/22 1007)  pantoprazole (PROTONIX) EC tablet 40 mg (40 mg Oral Given 01/11/22 1007)  albuterol (PROVENTIL) (2.5 MG/3ML) 0.083% nebulizer solution 2.5 mg (has no administration in time range)  apixaban (ELIQUIS) tablet 5 mg (5 mg Oral Given 01/11/22 1008)  sodium chloride flush (NS) 0.9 % injection 3 mL (3 mLs Intravenous Given 01/11/22 1008)  acetaminophen (TYLENOL) tablet 650 mg (has no administration in time range)    Or  acetaminophen (TYLENOL) suppository 650 mg (has no administration in time range)  senna-docusate (Senokot-S) tablet 1 tablet (has no administration in time range)  atenolol (TENORMIN) tablet 50 mg (50 mg Oral Given 01/11/22 1007)  diltiazem (CARDIZEM) 1 mg/mL load via infusion 15 mg (15 mg Intravenous Bolus from Bag 01/10/22 1901)  iohexol (OMNIPAQUE) 350 MG/ML injection 60 mL (60 mLs Intravenous Contrast Given 01/10/22 2010)    Mobility walks Low fall risk   Focused Assessments Cardiac Assessment Handoff:  Cardiac Rhythm: Atrial fibrillation, Bundle branch block, Other (Comment) (ST elevation) No results found for: "CKTOTAL", "CKMB", "CKMBINDEX", "TROPONINI" No results found for:  "DDIMER" Does the Patient currently have chest pain? No    R Recommendations: See Admitting Provider Note  Report given to:   Additional Notes:

## 2022-01-11 NOTE — Consult Note (Addendum)
Cardiology Consultation:   Patient ID: Dorothy Powers MRN: [id number]; DOB: 08/05/1945  Admit date: 01/10/2022 Date of Consult: 01/11/2022  PCP:  Orpah Melter, MD   [place] [facility] Cardiologist:  New - seen by Dr. Johney Frame     Patient Profile:   Dorothy Powers is a 76 y.o. Powers with a hx of hypertension, hyperlipidemia, and history of TIA who is being seen 01/11/2022 for the evaluation of newly diagnosed with atrial fibrillation at the request of Dr. Tana Coast.  History of Present Illness:   Dorothy Powers is a 76 year old Powers with past medical history of hypertension, hyperlipidemia, and history of TIA.  She had a history of tachypalpitations shortly after she was married around 40 years ago.  She has been on atenolol for rate control.  According to patient, she was seen by Dr. Radford Pax more than 20 years ago, however has not seen by cardiology service since.  She was admitted in August 2022 with TIA.  Echocardiogram showed normal EF, moderately dilated left atrium.  CT of the head was negative.  MRI showed severe stenosis in left posterior cerebral artery at P3/P4 junction, severe stenosis within P4 segmental of right posterior artery, possible aneurysm arising from cavernous right ICA, 1 to 2 mm inferiorly projecting vascular protrusion arising from paraclinoid internal carotid artery bilaterally which may reflect aneurysm or infundibula.  3.7 x 2.9 x 3.8 cm calcified and dural based mass compatible with hemangioma in the lateral left frontal lobe.  Repeat MRI of the brain in February 2023 showed no significant change.  Patient has been experiencing increasing fatigue for the past 2 months.  2 days ago on 01/09/2022, while sitting down watching TV around 4-5 PM, she had sharp right-sided chest pain under the right breast radiating up toward to the right shoulder blade. She has also been having increased nonproductive cough for the past 3 days.  She says she has ran out of Azelastine  nasal spray roughly 3 days ago.  Her right-sided chest pain went away after an hour however was more noticeable when she take a deep breath.  She woke up yesterday morning feeling fine.  When she checked her blood pressure, her blood pressure was normal however heart rate was in the 90s.  This is very unusual for her since her usual heart rate has been in the 70s.  This prompted the patient to seek medical attention at her PCPs office who noted she was in atrial fibrillation with RVR and sent her to the [facility], ED.  On arrival to [facility], ED, she was in atrial fibrillation with RVR with heart rate 122.  She was given a bolus of IV Cardizem 15 mg followed by Cardizem drip.  Heart rate overnight has been in the 80s.  IV Cardizem has been weaned off.  She has since been placed back on her usual dose of atenolol 50 mg twice a day and the continued to have a heart rate in the 80s.  She has not experienced any chest discomfort since 2 days ago.  She does continue to have a dry cough but no fever or chill.  Home hydrochlorothiazide and lisinopril has been held due to borderline blood pressure.  Patient was admitted by family medicine service and started on Eliquis 5 mg twice a day.  Note, she was on Plavix at home given prior history of TIA, Plavix has been discontinued.  Cardiology service consulted for atrial fibrillation with RVR.  Initial lab work was significant  for borderline hyponatremia with sodium 133, potassium was initially normal, repeat potassium this morning was 3.4.  TSH normal.  White blood cell count elevated on arrival at 16, this improved to 12 on repeat blood work this morning.  CTA of the chest showed no evidence of PE, mild cardiomegaly with small pericardial effusion, patchy groundglass opacity suggestive of mild pulmonary edema, enlarged subcarinal lymph nodes.  Echocardiogram performed in the morning of 01/11/2022 showed EF 60 to 65%, mild LAE, no significant elevation.    Past Medical  History:  Diagnosis Date   Hypertension    Meningioma (HCC)    Tachycardia    TIA (transient ischemic attack)     Past Surgical History:  Procedure Laterality Date   ABDOMINAL HYSTERECTOMY     BUNIONECTOMY Right 11/2020     Home Medications:  Prior to Admission medications   Medication Sig Start Date End Date Taking? Authorizing Provider  albuterol (VENTOLIN HFA) 108 (90 Base) MCG/ACT inhaler Inhale 1-2 puffs into the lungs every 6 (six) hours as needed for wheezing.   Yes [provider]  Ascorbic Acid (VITAMIN C PO) Take 1 tablet by mouth daily.   Yes [provider]  atenolol (TENORMIN) 50 MG tablet Take 50 mg by mouth 2 (two) times daily. 09/02/20  Yes [provider]  azelastine (ASTELIN) 0.1 % nasal spray Place 2 sprays into both nostrils daily as needed for allergies. 08/02/20  Yes [provider]  calcium citrate (CALCITRATE - DOSED IN MG ELEMENTAL CALCIUM) 950 (200 Ca) MG tablet Take 200 mg of elemental calcium by mouth daily.   Yes [provider]  cetirizine (ZYRTEC) 10 MG tablet Take 10 mg by mouth daily.   Yes [provider]  Cholecalciferol (VITAMIN D3) 125 MCG (5000 UT) TABS Take 1 tablet by mouth daily.   Yes [provider]  clopidogrel (PLAVIX) 75 MG tablet Take 1 tablet (75 mg total) by mouth daily. 02/15/21 02/15/22 Yes Bonnielee Haff, MD  Coenzyme Q10 (CO Q 10 PO) Take 1 tablet by mouth daily.   Yes [provider]  doxycycline (VIBRAMYCIN) 50 MG capsule Take 50 mg by mouth daily.   Yes [provider]  ezetimibe (ZETIA) 10 MG tablet Take 10 mg by mouth daily. 06/27/20  Yes [provider]  fluticasone (FLONASE) 50 MCG/ACT nasal spray Place 1 spray into both nostrils daily as needed for allergies.   Yes [provider]  glucosamine-chondroitin 500-400 MG tablet Take 1 tablet by mouth in the morning and at bedtime.   Yes [provider]  hydrochlorothiazide  (MICROZIDE) 12.5 MG capsule Take 12.5 mg by mouth daily.   Yes [provider]  lisinopril (ZESTRIL) 40 MG tablet Take 40 mg by mouth daily.   Yes [provider]  ondansetron (ZOFRAN) 4 MG tablet Take 1 tablet (4 mg total) by mouth every 8 (eight) hours as needed. Patient taking differently: Take 4 mg by mouth every 8 (eight) hours as needed for vomiting or nausea. 06/13/21  Yes Hyatt, Max T, DPM  pantoprazole (PROTONIX) 40 MG tablet Take 40 mg by mouth daily. 09/20/20  Yes [provider]  rosuvastatin (CRESTOR) 10 MG tablet Take 10 mg by mouth daily.   Yes [provider]  calcium carbonate (OSCAL) 1500 (600 Ca) MG TABS tablet Take by mouth 2 (two) times daily with a meal. Patient not taking: Reported on 01/10/2022    [provider]    Inpatient Medications: Scheduled Meds:  apixaban  5 mg Oral BID   atenolol  50 mg Oral BID   pantoprazole  40 mg Oral Daily   potassium chloride  40 mEq Oral Once   rosuvastatin  10 mg Oral Daily   sodium chloride flush  3 mL Intravenous Q12H   Continuous Infusions:  PRN Meds: acetaminophen **OR** acetaminophen, albuterol, senna-docusate  Allergies:    Allergies  Allergen Reactions   Codeine Other (See Comments)    "Couldn't move one night for a short time, after taking the medicaiton"   Lovastatin Other (See Comments)    Other reaction(s): leg pain   Sulfa Antibiotics Other (See Comments)    "Felt weak"    Social History:   Social History   Socioeconomic History   Marital status: Married    Spouse name: Not on file   Number of children: 0   Years of education: college   Highest education level: Bachelor's degree (e.g., BA, AB, BS)  Occupational History   Occupation: Retired  Tobacco Use   Smoking status: Former    Types: Cigarettes   Smokeless tobacco: Never  Substance and Sexual Activity   Alcohol use: Yes    Comment: social   Drug use: Never   Sexual activity: Not on file  Other  Topics Concern   Not on file  Social History Narrative   Lives at home with husband.   Right-handed.   No daily use of caffeine.   Social Determinants of Health   Financial Resource Strain: Not on file  Food Insecurity: Not on file  Transportation Needs: Not on file  Physical Activity: Not on file  Stress: Not on file  Social Connections: Not on file  Intimate Partner Violence: Not on file    Family History:    Family History  Problem Relation Age of Onset   Heart disease Mother        pacemaker   Heart attack Father      ROS:  Please see the history of present illness.   All other ROS reviewed and negative.     Physical Exam/Data:   Vitals:   01/11/22 0700 01/11/22 1000 01/11/22 1007 01/11/22 1230  BP: 110/73 114/65 114/65 125/89  Pulse: 76 91 87 87  Resp: (!) 22 19  (!) 22  Temp:      TempSrc:      SpO2: 94% 94%  98%  Weight:      Height:        Intake/Output Summary (Last 24 hours) at 01/11/2022 1317 Last data filed at 01/11/2022 1008 Gross per 24 hour  Intake 3 ml  Output --  Net 3 ml      01/10/2022    4:29 PM 04/12/2021    2:50 PM 02/14/2021   12:46 AM  Last 3 Weights  Weight (lbs) 221 lb 216 lb 212 lb  Weight (kg) 100.245 kg 97.977 kg 96.163 kg     Body mass index is 40.42 kg/m.  General:  Well nourished, well developed, in no acute distress HEENT: normal Neck: no JVD Vascular: No carotid bruits; Distal pulses 2+ bilaterally Cardiac: Irregularly irregular; no murmur  Lungs: Mild bibasilar crackles, the rest of the lung clear to auscultation Abd: soft, nontender, no hepatomegaly  Ext: no edema Musculoskeletal:  No deformities, BUE and BLE strength normal and equal Skin: warm and dry  Neuro:  CNs 2-12 intact, no focal abnormalities noted Psych:  Normal affect   EKG:  The EKG was personally reviewed and demonstrates: Atrial fibrillation  with RVR, right bundle branch block Telemetry:  Telemetry was personally reviewed and demonstrates: Atrial  fibrillation, heart rate well controlled in the 80s overnight  Relevant CV Studies:  Echo 01/11/2022  1. Left ventricular ejection fraction, by estimation, is 60 to 65%. The  left ventricle has normal function. The left ventricle has no regional  wall motion abnormalities. Left ventricular diastolic parameters are  indeterminate.   2. Right ventricular systolic function is normal. The right ventricular  size is normal. There is normal pulmonary artery systolic pressure.   3. Left atrial size was mildly dilated.   4. The mitral valve is abnormal. No evidence of mitral valve  regurgitation. No evidence of mitral stenosis.   5. The aortic valve is normal in structure. There is moderate  calcification of the aortic valve. There is moderate thickening of the  aortic valve. Aortic valve regurgitation is not visualized. Aortic valve  sclerosis/calcification is present, without any   evidence of aortic stenosis.   6. The inferior vena cava is normal in size with greater than 50%  respiratory variability, suggesting right atrial pressure of 3 mmHg.   Laboratory Data:  High Sensitivity Troponin:   Recent Labs  Lab 01/10/22 1637 01/10/22 1830  TROPONINIHS 9 10     Chemistry Recent Labs  Lab 01/10/22 1637 01/10/22 1830 01/11/22 0424  NA 133*  --  133*  K 3.9  --  3.4*  CL 97*  --  97*  CO2 26  --  25  GLUCOSE 151*  --  122*  BUN 12  --  18  CREATININE 0.79  --  0.91  CALCIUM 9.3  --  9.1  MG  --  1.9 1.9  GFRNONAA >60  --  >60  ANIONGAP 10  --  11    No results for input(s): "PROT", "ALBUMIN", "AST", "ALT", "ALKPHOS", "BILITOT" in the last 168 hours. Lipids No results for input(s): "CHOL", "TRIG", "HDL", "LABVLDL", "LDLCALC", "CHOLHDL" in the last 168 hours.  Hematology Recent Labs  Lab 01/10/22 1637 01/11/22 0424  WBC 16.0* 12.0*  RBC 4.90 4.32  HGB 14.3 12.7  HCT 42.4 37.4  MCV 86.5 86.6  MCH 29.2 29.4  MCHC 33.7 34.0  RDW 13.0 13.2  PLT 330 286   Thyroid   Recent Labs  Lab 01/11/22 0424  TSH 1.887    BNP Recent Labs  Lab 01/10/22 1855  BNP 105.2*    DDimer No results for input(s): "DDIMER" in the last 168 hours.   Radiology/Studies:  ECHOCARDIOGRAM COMPLETE  Result Date: 01/11/2022    ECHOCARDIOGRAM REPORT   Patient Name:   Dorothy Powers Date of Exam: 01/11/2022 Medical Rec #:  [id number]          Height:       62.0 in Accession #:    [id number]         Weight:       221.0 lb Date of Birth:  September 28, 1945          BSA:          1.995 m Patient Age:    48 years           BP:           110/73 mmHg Patient Gender: F                  HR:           80 bpm. Exam Location:  Inpatient Procedure: 2D Echo Indications:  Atrial fibrillation  History:        Patient has prior history of Echocardiogram examinations, most                 recent 02/15/2021. Arrythmias:Atrial Fibrillation; Risk                 Factors:Hypertension and Dyslipidemia.  Sonographer:    Johny Chess RDCS Referring Phys: [id number] [place]  1. Left ventricular ejection fraction, by estimation, is 60 to 65%. The left ventricle has normal function. The left ventricle has no regional wall motion abnormalities. Left ventricular diastolic parameters are indeterminate.  2. Right ventricular systolic function is normal. The right ventricular size is normal. There is normal pulmonary artery systolic pressure.  3. Left atrial size was mildly dilated.  4. The mitral valve is abnormal. No evidence of mitral valve regurgitation. No evidence of mitral stenosis.  5. The aortic valve is normal in structure. There is moderate calcification of the aortic valve. There is moderate thickening of the aortic valve. Aortic valve regurgitation is not visualized. Aortic valve sclerosis/calcification is present, without any  evidence of aortic stenosis.  6. The inferior vena cava is normal in size with greater than 50% respiratory variability, suggesting right atrial pressure of 3 mmHg.  FINDINGS  Left Ventricle: Left ventricular ejection fraction, by estimation, is 60 to 65%. The left ventricle has normal function. The left ventricle has no regional wall motion abnormalities. The left ventricular internal cavity size was normal in size. There is  no left ventricular hypertrophy. Left ventricular diastolic parameters are indeterminate. Right Ventricle: The right ventricular size is normal. No increase in right ventricular wall thickness. Right ventricular systolic function is normal. There is normal pulmonary artery systolic pressure. The tricuspid regurgitant velocity is 2.43 m/s, and  with an assumed right atrial pressure of 3 mmHg, the estimated right ventricular systolic pressure is 03.4 mmHg. Left Atrium: Left atrial size was mildly dilated. Right Atrium: Right atrial size was normal in size. Pericardium: There is no evidence of pericardial effusion. Mitral Valve: The mitral valve is abnormal. There is mild thickening of the mitral valve leaflet(s). There is mild calcification of the mitral valve leaflet(s). Mild mitral annular calcification. No evidence of mitral valve regurgitation. No evidence of mitral valve stenosis. Tricuspid Valve: The tricuspid valve is normal in structure. Tricuspid valve regurgitation is trivial. No evidence of tricuspid stenosis. Aortic Valve: The aortic valve is normal in structure. There is moderate calcification of the aortic valve. There is moderate thickening of the aortic valve. Aortic valve regurgitation is not visualized. Aortic valve sclerosis/calcification is present, without any evidence of aortic stenosis. Pulmonic Valve: The pulmonic valve was normal in structure. Pulmonic valve regurgitation is not visualized. No evidence of pulmonic stenosis. Aorta: The aortic root is normal in size and structure. Venous: The inferior vena cava is normal in size with greater than 50% respiratory variability, suggesting right atrial pressure of 3 mmHg. IAS/Shunts: No  atrial level shunt detected by color flow Doppler.  LEFT VENTRICLE PLAX 2D LVIDd:         3.20 cm LVIDs:         2.00 cm LV PW:         1.00 cm LV IVS:        1.00 cm LVOT diam:     1.70 cm LVOT Area:     2.27 cm  IVC IVC diam: 1.40 cm LEFT ATRIUM  Index        RIGHT ATRIUM           Index LA diam:        3.90 cm 1.96 cm/m   RA Area:     16.70 cm LA Vol (A2C):   61.0 ml 30.58 ml/m  RA Volume:   44.30 ml  22.21 ml/m LA Vol (A4C):   69.3 ml 34.74 ml/m LA Biplane Vol: 65.7 ml 32.94 ml/m   AORTA Ao Root diam: 2.90 cm Ao Asc diam:  3.00 cm TRICUSPID VALVE TR Peak grad:   23.6 mmHg TR Vmax:        243.00 cm/s  SHUNTS Systemic Diam: 1.70 cm Jenkins Rouge MD Electronically signed by Jenkins Rouge MD Signature Date/Time: 01/11/2022/8:59:31 AM    Final    CT Angio Chest PE W/Cm &/Or Wo Cm  Result Date: 01/10/2022 CLINICAL DATA:  Right-sided chest pain. EXAM: CT ANGIOGRAPHY CHEST WITH CONTRAST TECHNIQUE: Multidetector CT imaging of the chest was performed using the standard protocol during bolus administration of intravenous contrast. Multiplanar CT image reconstructions and MIPs were obtained to evaluate the vascular anatomy. RADIATION DOSE REDUCTION: This exam was performed according to the departmental dose-optimization program which includes automated exposure control, adjustment of the mA and/or kV according to patient size and/or use of iterative reconstruction technique. CONTRAST:  58m OMNIPAQUE IOHEXOL 350 MG/ML SOLN COMPARISON:  None Available. FINDINGS: Cardiovascular: Aorta is normal in size. Heart is mildly enlarged. There is a small pericardial effusion. There is adequate opacification of the pulmonary arteries to the segmental level. There are atherosclerotic calcifications of the aorta and coronary arteries. Mediastinum/Nodes: There is an enlarged subcarinal lymph node measuring 13 mm short axis. There are nonenlarged, but prominent paratracheal, AP window, prevascular and hilar lymph nodes.  The esophagus and thyroid gland are within normal limits. Lungs/Pleura: There are patchy ground-glass opacities throughout both lungs diffusely. There is atelectasis in the bilateral lower lobes. There is no pleural effusion or pneumothorax identified. Upper Abdomen: No acute abnormality. Musculoskeletal: No chest wall abnormality. No acute or significant osseous findings. Review of the MIP images confirms the above findings. IMPRESSION: 1. No evidence for pulmonary embolism. 2. Mild cardiomegaly with small pericardial effusion. 3. Patchy ground-glass opacities throughout both lungs favored as mild edema. 4. Enlarged subcarinal lymph node. Aortic Atherosclerosis (ICD10-I70.0). Electronically Signed   By: ARonney AstersM.D.   On: 01/10/2022 20:36   DG Chest 2 View  Result Date: 01/10/2022 CLINICAL DATA:  Chest pain EXAM: CHEST - 2 VIEW COMPARISON:  Radiograph 01/10/2022 FINDINGS: Unchanged cardiomediastinal silhouette. Low lung volumes with bibasilar subsegmental atelectasis. No large pleural effusion. No pneumothorax. No acute osseous abnormality. IMPRESSION: Low lung volumes with bibasilar subsegmental atelectasis. No new airspace disease. Electronically Signed   By: JMaurine SimmeringM.D.   On: 01/10/2022 16:51     Assessment and Plan:   Newly diagnosed atrial fibrillation with RVR   -Patient has been feeling fatigued for 2 months, however she says she regularly check her blood pressure and heart rate, her heart rate has been previously normal until yesterday when she noted her heart rate was up in the 90s, her usual heart rate has been in the 70s.  -On arrival to [facility] ED yesterday, she was noted to be in atrial fibrillation with RVR, heart rate in the 120s.  She was given 15 mg IV Cardizem followed by diltiazem drip overnight.  Diltiazem has since been weaned off.  She is on 50 mg  twice daily of atenolol which is her home dose.  Heart rate is very well controlled in the 80s.  She currently has no  cardiac awareness of A-fib.  -Echocardiogram showed normal EF, mild dilated left atrium.  -Started on Eliquis 5 mg twice a day during this hospitalization.  We will continue Eliquis and atenolol.  Heart rate is well controlled and the patient is asymptomatic.  Follow-up as outpatient in 3 weeks before proceeding with cardioversion.  Right-sided chest pain: Resolved, occurred in the afternoon of 6/28.  Lasted roughly an hour.  Serial troponin negative.  Symptom was pleuritic.  I suspect her chest pain is likely unrelated to A-fib as it went away 2 days ago despite the fact she was still in A-fib with RVR yesterday.  Cough: Patient attributed her cough to running out of nasal spray for the past 3 days.  It is a dry cough.  Afebrile on arrival.  Elevation of the white blood cell count on arrival was concerning for viral etiology.  CTA shows bibasilar opacity, possibly pulmonary edema.  Will discuss with MD, consider single dose of 20 mg IV Lasix and see her response. If breathing is ok, maybe able to discharge afterward. We will set up outpatient visit with afib clinic in 3 weeks.  Leukocytosis: White blood cell count on arrival 16,000, improved to 12,000 this morning.  Afebrile.  Denies any fever or chills at home.  Cough has been going on for few days.  Nonproductive  Hypertension: Home lisinopril and HCTZ on hold due to borderline blood pressure. Will not restart given borderline BP  Hyperlipidemia: On Crestor      Risk Assessment/Risk Scores:     HEAR Score (for undifferentiated chest pain):       CHA2DS2-VASc Score = 6   This indicates a 9.7% annual risk of stroke. The patient's score is based upon: CHF History: 0 HTN History: 1 Diabetes History: 0 Stroke History: 2 Vascular Disease History: 0 Age Score: 2 Gender Score: 1         For questions or updates, please contact [place] Please consult www.[email or web address].com for contact info under    Signed, Almyra Deforest, [place]  01/11/2022  1:17 PM  Patient seen and examined and agree with Almyra Deforest, PA as detailed above.  In brief, the patient is a 76 year old Powers with history of HTN, HLD and prior TIA who presented to the ER from her PCP office with newly diagnosed Afib with RVR for which Cardiology was consulted.  Patient has history of TIA in 02/2021. TTE at that time with EF 60-65%, normal RV, moderately dilated LA, trivial MR. Telemetry without evidence of Afib and patient was maintained on plavix.  Patient presents on this admission after she was found to be in Afib at her PCP office. Specifically, the paatient was checking her blood pressure today and her HR were in the 90s which is unusual for her as her resting HR is usually in the 70s. She felt her pulse and noted it was irregular prompting her to go to her PCPs office. There she was in Afib with RVR with HR 120s.  In the ER, the patient continued to be in Afib with RVR on arrival with HR 122. She was given dilt '15mg'$  and then started on a gtt as well as apixaban for [facility]. She has since been weaned off the dilt and placed on her home atenolol '50mg'$  BID with HR 80s. TSH normal. TTE with normal BiV  function and no significant valve disease. CTA with mild pulmonary edema. She overall feels well other than a cough which I suspect may be related to viral illness given elevated WBC count on arrival.   Overall, patient is well rate controlled and minimally symptomatic. Agree with lasix '20mg'$  IV once, continuing atenolol '50mg'$  BID and apixaban '5mg'$  BID for AC. She can be discharged home later today from a CV perspective. Will arrange follow-up with Afib clinic with plans for DCCV in 3 weeks if remains out of rhythm.  GEN: No acute distress.   Neck: No JVD Cardiac: Irregular, no murmrus Respiratory: Crackles at the bases GI: Soft, nontender, non-distended  MS: Trace LE edema, warm Neuro:  Nonfocal  Psych: Normal affect    Plan: -Continue atenolol '50mg'$  BID and apixaban '5mg'$   BID -Give lasix '20mg'$  IV now for mild vascular congestion likely driven by Afib with RVR -Okay to discharge home from a CV perspective later today -Will arrange for follow-up in Afib clinic -If remains in Afib after 3 weeks of AC, can arrange for DCCV at that time -Agree with stopping plavix given need for [facility]  Gwyndolyn Kaufman, MD

## 2022-01-11 NOTE — Progress Notes (Signed)
  Echocardiogram 2D Echocardiogram has been performed.  Dorothy Powers 01/11/2022, 8:34 AM

## 2022-01-11 NOTE — Progress Notes (Signed)
[facility]                                                                              Fl[place], is a 76 y.o. female, DOB - 18-Mar-1946, PFX:[id number] Admit date - 01/10/2022    Outpatient Primary MD for the patient is Orpah Melter, MD  LOS - 0  days  Chief Complaint  Patient presents with   Chest Pain       Brief summary   Patient is a 76 year old female with HTN, HLP, tachycardia, history of TIA presented to ED with chest discomfort.  Patient was sitting in her chair watching TV evening before the admission when she dropped acute right-sided chest discomfort radiating to the right shoulder.  She did not feel any palpitations and continued to have ongoing symptoms.  She felt her pulse and noticed to be irregular and presented to ED.  She has 76 year old more history of tachycardia, on atenolol but denies any history of A-fib.  No prior cardiac work-up In ED EKG showed A-fib with rate 121, LAD, RBBB CTA chest negative for PE but notable for mild cardiomegaly with small pericardial effusion and patchy groundglass opacities bilaterally that likely reflect mild edema.  Patient was given IV diltiazem load 15 mg and started on infusion.   Assessment & Plan    Principal Problem:   New onset atrial fibrillation ([place]) with RVR -Noted to be in rapid atrial fibrillation with RVR, chest discomfort, denies any prior history of A-fib however has tachycardia in the past on atenolol. -CTA chest negative for PE, BNP 105.2, TSH 1.8 -Currently rate controlled, still in A-fib.  Patient was resumed on atenolol and diltiazem infusion has been weaned off -Started on eliquis for anticoagulation -2D echo completed, EF 60 to 65%, no regional wall motion abnormalities.  Cardiology was consulted, will await any further recommendations  Active Problems:   Essential hypertension -BP currently stable, continue atenolol    Gastro-esophageal reflux disease without  esophagitis -Stable, continue PPI    Transient ischemic attack -Currently stable, no new deficits, started on eliquis    Hypokalemia -Replaced    Hyperlipidemia -Continue Crestor    Pericardial effusion -CTA chest showed mild pericardial effusion, however no evidence of pericardial effusion on 2D echocardiogram  Obesity Estimated body mass index is 40.42 kg/m as calculated from the following:   Height as of this encounter: '5\' 2"'$  (1.575 m).   Weight as of this encounter: 100.2 kg.  Code Status: Full code DVT Prophylaxis:   apixaban (ELIQUIS) tablet 5 mg   Level of Care: Level of care: Progressive Family Communication: Updated patient   Disposition Plan:      Remains inpatient appropriate: Awaiting cardiology evaluation, possible DC home today   Procedures:  2D echo  Consultants:   Cardiology  Antimicrobials:   Anti-infectives (From admission, onward)    None          Medications  apixaban  5 mg Oral BID   atenolol  50 mg Oral BID   pantoprazole  40 mg Oral Daily   potassium chloride  40 mEq Oral Once   rosuvastatin  10 mg Oral  Daily   sodium chloride flush  3 mL Intravenous Q12H      Subjective:   Dorothy Powers was seen and examined today.  No acute complaints, feeling a lot better today.  Chest pain has resolved. Patient denies dizziness, shortness of breath, abdominal pain, N/V/D/C, new weakness, numbess, tingling. No acute events overnight.    Objective:   Vitals:   01/11/22 0545 01/11/22 0700 01/11/22 1000 01/11/22 1007  BP: (!) 101/56 110/73 114/65 114/65  Pulse: 72 76 91 87  Resp: (!) 21 (!) 22 19   Temp:      TempSrc:      SpO2: 93% 94% 94%   Weight:      Height:        Intake/Output Summary (Last 24 hours) at 01/11/2022 1058 Last data filed at 01/11/2022 1008 Gross per 24 hour  Intake 3 ml  Output --  Net 3 ml     Wt Readings from Last 3 Encounters:  01/10/22 100.2 kg  04/12/21 98 kg  02/14/21 96.2 kg      Exam General: Alert and oriented x 3, NAD Cardiovascular: Irregularly irregular Respiratory: CTA B Gastrointestinal: Soft, nontender, nondistended, + bowel sounds Ext: no pedal edema bilaterally Neuro: Strength 5/5 upper and lower extremities bilaterally Psych: Normal affect and demeanor, alert and oriented x3     Data Reviewed:  I have personally reviewed following labs    CBC Lab Results  Component Value Date   WBC 12.0 (H) 01/11/2022   RBC 4.32 01/11/2022   HGB 12.7 01/11/2022   HCT 37.4 01/11/2022   MCV 86.6 01/11/2022   MCH 29.4 01/11/2022   PLT 286 01/11/2022   MCHC 34.0 01/11/2022   RDW 13.2 01/11/2022   LYMPHSABS 1.5 02/14/2021   MONOABS 1.0 02/14/2021   EOSABS 0.3 02/14/2021   BASOSABS 0.1 16/04/9603     Last metabolic panel Lab Results  Component Value Date   NA 133 (L) 01/11/2022   K 3.4 (L) 01/11/2022   CL 97 (L) 01/11/2022   CO2 25 01/11/2022   BUN 18 01/11/2022   CREATININE 0.91 01/11/2022   GLUCOSE 122 (H) 01/11/2022   GFRNONAA >60 01/11/2022   CALCIUM 9.1 01/11/2022   PHOS 4.4 02/14/2021   PROT 6.8 02/14/2021   ALBUMIN 3.7 02/14/2021   BILITOT 0.6 02/14/2021   ALKPHOS 94 02/14/2021   AST 19 02/14/2021   ALT 18 02/14/2021   ANIONGAP 11 01/11/2022      Radiology Studies: I have personally reviewed the imaging studies  ECHOCARDIOGRAM COMPLETE  Result Date: 01/11/2022    ECHOCARDIOGRAM REPORT   IMPRESSIONS  1. Left ventricular ejection fraction, by estimation, is 60 to 65%. The left ventricle has normal function. The left ventricle has no regional wall motion abnormalities. Left ventricular diastolic parameters are indeterminate.  2. Right ventricular systolic function is normal. The right ventricular size is normal. There is normal pulmonary artery systolic pressure.  3. Left atrial size was mildly dilated.  4. The mitral valve is abnormal. No evidence of mitral valve regurgitation. No evidence of mitral stenosis.  5. The aortic valve  is normal in structure. There is moderate calcification of the aortic valve. There is moderate thickening of the aortic valve. Aortic valve regurgitation is not visualized. Aortic valve sclerosis/calcification is present, without any  evidence of aortic stenosis.  6. The inferior vena cava is normal in size with greater than 50% respiratory variability, suggesting right atrial pressure of 3 mmHg. Jenkins Rouge MD Electronically signed by  Jenkins Rouge MD Signature Date/Time: 01/11/2022/8:59:31 AM    Final    CT Angio Chest PE W/Cm &/Or Wo Cm  Result Date: 01/10/2022 CLINICAL DATA:  Right-sided chest pain.IMPRESSION: 1. No evidence for pulmonary embolism. 2. Mild cardiomegaly with small pericardial effusion. 3. Patchy ground-glass opacities throughout both lungs favored as mild edema. 4. Enlarged subcarinal lymph node. Aortic Atherosclerosis (ICD10-I70.0). Electronically Signed   By: Ronney Asters M.D.   On: 01/10/2022 20:36   DG Chest 2 View  Result Date: 01/10/2022 CLINICAL DATA:  Chest pain EXAM: CHEST - 2 VIEW COMPARISON:  Radiograph 01/10/2022 FINDINGS: Unchanged cardiomediastinal silhouette. Low lung volumes with bibasilar subsegmental atelectasis. No large pleural effusion. No pneumothorax. No acute osseous abnormality. IMPRESSION: Low lung volumes with bibasilar subsegmental atelectasis. No new airspace disease. Electronically Signed   By: Maurine Simmering M.D.   On: 01/10/2022 16:51       Treylen Gibbs M.D. [facility] 01/11/2022, 10:58 AM  Available via Epic secure chat 7am-7pm After 7 pm, please refer to night coverage provider listed on amion.

## 2022-01-11 NOTE — ED Notes (Signed)
Pt got out of bed and walked to the restroom with no problems of dizziness or lightheadedness.

## 2022-01-11 NOTE — Discharge Instructions (Signed)

## 2022-01-11 NOTE — Discharge Summary (Signed)
Physician Discharge Summary   Patient: Dorothy Powers MRN: [id number] DOB: June 15, 1946  Admit date:     01/10/2022  Discharge date: 01/11/22  Discharge Physician: Estill Cotta, MD    PCP: Orpah Melter, MD   Recommendations at discharge:   Discontinued Plavix Started on Eliquis 5 mg twice daily Lasix 20 mg daily as needed for shortness of breath or edema Discontinued HCTZ, hold lisinopril  Discharge Diagnoses:    New onset atrial fibrillation ([place])   Essential hypertension   Gastro-esophageal reflux disease without esophagitis   Transient ischemic attack   Hypokalemia   Hyperlipidemia   Pericardial effusion Obesity   Hospital Course: Patient is a 76 year old female with HTN, HLP, tachycardia, history of TIA presented to ED with chest discomfort.  Patient was sitting in her chair watching TV evening before the admission when she dropped acute right-sided chest discomfort radiating to the right shoulder.  She did not feel any palpitations and continued to have ongoing symptoms.  She felt her pulse and noticed to be irregular and presented to ED.  She has 76 year old more history of tachycardia, on atenolol but denies any history of A-fib.  No prior cardiac work-up In ED EKG showed A-fib with rate 121, LAD, RBBB CTA chest negative for PE but notable for mild cardiomegaly with small pericardial effusion and patchy groundglass opacities bilaterally that likely reflect mild edema.  Patient was given IV diltiazem load 15 mg and started on infusion.  Assessment and Plan:   New onset atrial fibrillation (HCC) with RVR -Noted to be in rapid atrial fibrillation with RVR, chest discomfort, denies any prior history of A-fib however has tachycardia in the past on atenolol. -CTA chest negative for PE, BNP 105.2, TSH 1.8 -Currently rate controlled, still in A-fib.  Patient was resumed on atenolol and diltiazem infusion has been weaned off -Started on eliquis for anticoagulation -2D echo  completed, EF 60 to 65%, no regional wall motion abnormalities.  -She was cleared by cardiology to be discharged home.  Patient will be followed outpatient in A-fib clinic.  If she remains in A-fib after 3 weeks of anticoagulation, cardiology will arrange DCCV at that time. -Received Lasix 20 mg IV x1 for mild pulm vascular congestion driven by A-fib with RVR      Essential hypertension -BP currently stable, continue atenolol     Gastro-esophageal reflux disease without esophagitis -Stable, continue PPI   History of transient ischemic attack -Currently stable, no new deficits, started on eliquis -Plavix have been discontinued     Hypokalemia -Replaced     Hyperlipidemia -Continue Crestor     Pericardial effusion -CTA chest showed mild pericardial effusion, however no evidence of pericardial effusion on 2D echocardiogram   Obesity Estimated body mass index is 40.42 kg/m as calculated from the following:   Height as of this encounter: '5\' 2"'$  (1.575 m).   Weight as of this encounter: 100.2 kg.        Pain control - [employer] Controlled Substance Reporting System database was reviewed. and patient was instructed, not to drive, operate heavy machinery, perform activities at heights, swimming or participation in water activities or provide baby-sitting services while on Pain, Sleep and Anxiety Medications; until their outpatient Physician has advised to do so again. Also recommended to not to take more than prescribed Pain, Sleep and Anxiety Medications.  Consultants: Cardiology Procedures performed: 2D echo Disposition: Home Diet recommendation:  Discharge Diet Orders (From admission, onward)     Start  Ordered   01/11/22 0000  Diet - low sodium heart healthy        01/11/22 1348           Cardiac diet DISCHARGE MEDICATION: Allergies as of 01/11/2022       Reactions   Codeine Other (See Comments)   "Couldn't move one night for a short time, after taking the  medicaiton"   Lovastatin Other (See Comments)   Other reaction(s): leg pain   Sulfa Antibiotics Other (See Comments)   "Felt weak"        Medication List     STOP taking these medications    calcium carbonate 1500 (600 Ca) MG Tabs tablet Commonly known as: OSCAL   clopidogrel 75 MG tablet Commonly known as: Plavix   doxycycline 50 MG capsule Commonly known as: VIBRAMYCIN   hydrochlorothiazide 12.5 MG capsule Commonly known as: MICROZIDE   lisinopril 40 MG tablet Commonly known as: ZESTRIL       TAKE these medications    albuterol 108 (90 Base) MCG/ACT inhaler Commonly known as: VENTOLIN HFA Inhale 1-2 puffs into the lungs every 6 (six) hours as needed for wheezing.   apixaban 5 MG Tabs tablet Commonly known as: ELIQUIS Take 1 tablet (5 mg total) by mouth 2 (two) times daily.   atenolol 50 MG tablet Commonly known as: TENORMIN Take 1 tablet (50 mg total) by mouth 2 (two) times daily.   azelastine 0.1 % nasal spray Commonly known as: ASTELIN Place 2 sprays into both nostrils daily as needed for allergies.   calcium citrate 950 (200 Ca) MG tablet Commonly known as: CALCITRATE - dosed in mg elemental calcium Take 200 mg of elemental calcium by mouth daily.   cetirizine 10 MG tablet Commonly known as: ZYRTEC Take 10 mg by mouth daily.   CO Q 10 PO Take 1 tablet by mouth daily.   ezetimibe 10 MG tablet Commonly known as: ZETIA Take 10 mg by mouth daily.   fluticasone 50 MCG/ACT nasal spray Commonly known as: FLONASE Place 1 spray into both nostrils daily as needed for allergies.   furosemide 20 MG tablet Commonly known as: Lasix Take 1 tablet (20 mg total) by mouth daily as needed for fluid or edema.   glucosamine-chondroitin 500-400 MG tablet Take 1 tablet by mouth in the morning and at bedtime.   ondansetron 4 MG tablet Commonly known as: Zofran Take 1 tablet (4 mg total) by mouth every 8 (eight) hours as needed. What changed: reasons to take  this   pantoprazole 40 MG tablet Commonly known as: PROTONIX Take 40 mg by mouth daily.   rosuvastatin 10 MG tablet Commonly known as: CRESTOR Take 10 mg by mouth daily.   VITAMIN C PO Take 1 tablet by mouth daily.   Vitamin D3 125 MCG (5000 UT) Tabs Take 1 tablet by mouth daily.        Follow-up Information     [facility] Follow up.   Specialty: Cardiology Why: Dr. Johney Frame of cardiology service has referred you to our afib clinic. Our scheduler will contact you to arrange follow up. Contact information: [address] [id number] [address]        Orpah Melter, MD. Schedule an appointment as soon as possible for a visit in 2 week(s).   Specialty: Family Medicine Why: for hospital follow-up Contact information: [place] [address] [phone]  Discharge Exam: Filed Weights   01/10/22 1629  Weight: 100.2 kg   S: No acute complaints, feeling better.  No chest pain or acute palpitations.   Vitals:   01/11/22 0700 01/11/22 1000 01/11/22 1007 01/11/22 1230  BP: 110/73 114/65 114/65 125/89  Pulse: 76 91 87 87  Resp: (!) 22 19  (!) 22  Temp:      TempSrc:      SpO2: 94% 94%  98%  Weight:      Height:        Physical Exam General: Alert and oriented x 3, NAD Cardiovascular: Irregularly irregular Respiratory: Diminished breath sound at the bases Gastrointestinal: Soft, nontender, nondistended, NBS Ext: trace pedal edema bilaterally Neuro: no new deficits Skin: No rashes Psych: Normal affect and demeanor, alert and oriented x3    Condition at discharge: fair  The results of significant diagnostics from this hospitalization (including imaging, microbiology, ancillary and laboratory) are listed below for reference.   Imaging Studies: ECHOCARDIOGRAM COMPLETE  Result Date: 01/11/2022    ECHOCARDIOGRAM REPORT   Patient Name:    LETITIA SABALA Date of Exam: 01/11/2022 Medical Rec #:  [id number]          Height:       62.0 in Accession #:    [id number]         Weight:       221.0 lb Date of Birth:  02/14/1946          BSA:          1.995 m Patient Age:    23 years           BP:           110/73 mmHg Patient Gender: F                  HR:           80 bpm. Exam Location:  Inpatient Procedure: 2D Echo Indications:    Atrial fibrillation  History:        Patient has prior history of Echocardiogram examinations, most                 recent 02/15/2021. Arrythmias:Atrial Fibrillation; Risk                 Factors:Hypertension and Dyslipidemia.  Sonographer:    Johny Chess RDCS Referring Phys: [id number] [place]  1. Left ventricular ejection fraction, by estimation, is 60 to 65%. The left ventricle has normal function. The left ventricle has no regional wall motion abnormalities. Left ventricular diastolic parameters are indeterminate.  2. Right ventricular systolic function is normal. The right ventricular size is normal. There is normal pulmonary artery systolic pressure.  3. Left atrial size was mildly dilated.  4. The mitral valve is abnormal. No evidence of mitral valve regurgitation. No evidence of mitral stenosis.  5. The aortic valve is normal in structure. There is moderate calcification of the aortic valve. There is moderate thickening of the aortic valve. Aortic valve regurgitation is not visualized. Aortic valve sclerosis/calcification is present, without any  evidence of aortic stenosis.  6. The inferior vena cava is normal in size with greater than 50% respiratory variability, suggesting right atrial pressure of 3 mmHg. FINDINGS  Left Ventricle: Left ventricular ejection fraction, by estimation, is 60 to 65%. The left ventricle has normal function. The left ventricle has no regional wall motion abnormalities. The left ventricular internal cavity size was normal in size. There  is  no left ventricular hypertrophy.  Left ventricular diastolic parameters are indeterminate. Right Ventricle: The right ventricular size is normal. No increase in right ventricular wall thickness. Right ventricular systolic function is normal. There is normal pulmonary artery systolic pressure. The tricuspid regurgitant velocity is 2.43 m/s, and  with an assumed right atrial pressure of 3 mmHg, the estimated right ventricular systolic pressure is 38.7 mmHg. Left Atrium: Left atrial size was mildly dilated. Right Atrium: Right atrial size was normal in size. Pericardium: There is no evidence of pericardial effusion. Mitral Valve: The mitral valve is abnormal. There is mild thickening of the mitral valve leaflet(s). There is mild calcification of the mitral valve leaflet(s). Mild mitral annular calcification. No evidence of mitral valve regurgitation. No evidence of mitral valve stenosis. Tricuspid Valve: The tricuspid valve is normal in structure. Tricuspid valve regurgitation is trivial. No evidence of tricuspid stenosis. Aortic Valve: The aortic valve is normal in structure. There is moderate calcification of the aortic valve. There is moderate thickening of the aortic valve. Aortic valve regurgitation is not visualized. Aortic valve sclerosis/calcification is present, without any evidence of aortic stenosis. Pulmonic Valve: The pulmonic valve was normal in structure. Pulmonic valve regurgitation is not visualized. No evidence of pulmonic stenosis. Aorta: The aortic root is normal in size and structure. Venous: The inferior vena cava is normal in size with greater than 50% respiratory variability, suggesting right atrial pressure of 3 mmHg. IAS/Shunts: No atrial level shunt detected by color flow Doppler.  LEFT VENTRICLE PLAX 2D LVIDd:         3.20 cm LVIDs:         2.00 cm LV PW:         1.00 cm LV IVS:        1.00 cm LVOT diam:     1.70 cm LVOT Area:     2.27 cm  IVC IVC diam: 1.40 cm LEFT ATRIUM             Index        RIGHT ATRIUM            Index LA diam:        3.90 cm 1.96 cm/m   RA Area:     16.70 cm LA Vol (A2C):   61.0 ml 30.58 ml/m  RA Volume:   44.30 ml  22.21 ml/m LA Vol (A4C):   69.3 ml 34.74 ml/m LA Biplane Vol: 65.7 ml 32.94 ml/m   AORTA Ao Root diam: 2.90 cm Ao Asc diam:  3.00 cm TRICUSPID VALVE TR Peak grad:   23.6 mmHg TR Vmax:        243.00 cm/s  SHUNTS Systemic Diam: 1.70 cm Jenkins Rouge MD Electronically signed by Jenkins Rouge MD Signature Date/Time: 01/11/2022/8:59:31 AM    Final    CT Angio Chest PE W/Cm &/Or Wo Cm  Result Date: 01/10/2022 CLINICAL DATA:  Right-sided chest pain. EXAM: CT ANGIOGRAPHY CHEST WITH CONTRAST TECHNIQUE: Multidetector CT imaging of the chest was performed using the standard protocol during bolus administration of intravenous contrast. Multiplanar CT image reconstructions and MIPs were obtained to evaluate the vascular anatomy. RADIATION DOSE REDUCTION: This exam was performed according to the departmental dose-optimization program which includes automated exposure control, adjustment of the mA and/or kV according to patient size and/or use of iterative reconstruction technique. CONTRAST:  70m OMNIPAQUE IOHEXOL 350 MG/ML SOLN COMPARISON:  None Available. FINDINGS: Cardiovascular: Aorta is normal in size. Heart is mildly enlarged. There is a small  pericardial effusion. There is adequate opacification of the pulmonary arteries to the segmental level. There are atherosclerotic calcifications of the aorta and coronary arteries. Mediastinum/Nodes: There is an enlarged subcarinal lymph node measuring 13 mm short axis. There are nonenlarged, but prominent paratracheal, AP window, prevascular and hilar lymph nodes. The esophagus and thyroid gland are within normal limits. Lungs/Pleura: There are patchy ground-glass opacities throughout both lungs diffusely. There is atelectasis in the bilateral lower lobes. There is no pleural effusion or pneumothorax identified. Upper Abdomen: No acute abnormality.  Musculoskeletal: No chest wall abnormality. No acute or significant osseous findings. Review of the MIP images confirms the above findings. IMPRESSION: 1. No evidence for pulmonary embolism. 2. Mild cardiomegaly with small pericardial effusion. 3. Patchy ground-glass opacities throughout both lungs favored as mild edema. 4. Enlarged subcarinal lymph node. Aortic Atherosclerosis (ICD10-I70.0). Electronically Signed   By: Ronney Asters M.D.   On: 01/10/2022 20:36   DG Chest 2 View  Result Date: 01/10/2022 CLINICAL DATA:  Chest pain EXAM: CHEST - 2 VIEW COMPARISON:  Radiograph 01/10/2022 FINDINGS: Unchanged cardiomediastinal silhouette. Low lung volumes with bibasilar subsegmental atelectasis. No large pleural effusion. No pneumothorax. No acute osseous abnormality. IMPRESSION: Low lung volumes with bibasilar subsegmental atelectasis. No new airspace disease. Electronically Signed   By: Maurine Simmering M.D.   On: 01/10/2022 16:51    Microbiology: Results for orders placed or performed during the hospital encounter of 02/14/21  Resp Panel by RT-PCR (Flu A&B, Covid) Nasopharyngeal Swab     Status: None   Collection Time: 02/14/21  1:20 AM   Specimen: Nasopharyngeal Swab; Nasopharyngeal(NP) swabs in vial transport medium  Result Value Ref Range Status   SARS Coronavirus 2 by RT PCR NEGATIVE NEGATIVE Final    Comment: (NOTE) SARS-CoV-2 target nucleic acids are NOT DETECTED.  The SARS-CoV-2 RNA is generally detectable in upper respiratory specimens during the acute phase of infection. The lowest concentration of SARS-CoV-2 viral copies this assay can detect is 138 copies/mL. A negative result does not preclude SARS-Cov-2 infection and should not be used as the sole basis for treatment or other patient management decisions. A negative result may occur with  improper specimen collection/handling, submission of specimen other than nasopharyngeal swab, presence of viral mutation(s) within the areas targeted  by this assay, and inadequate number of viral copies(<138 copies/mL). A negative result must be combined with clinical observations, patient history, and epidemiological information. The expected result is Negative.  Fact Sheet for Patients:  EntrepreneurPulse.com.au  Fact Sheet for Healthcare Providers:  IncredibleEmployment.be  This test is no t yet approved or cleared by the [place] FDA and  has been authorized for detection and/or diagnosis of SARS-CoV-2 by FDA under an Emergency Use Authorization (EUA). This EUA will remain  in effect (meaning this test can be used) for the duration of the COVID-19 declaration under Section 564(b)(1) of the Act, 21 U.S.C.section 360bbb-3(b)(1), unless the authorization is terminated  or revoked sooner.       Influenza A by PCR NEGATIVE NEGATIVE Final   Influenza B by PCR NEGATIVE NEGATIVE Final    Comment: (NOTE) The Xpert Xpress SARS-CoV-2/FLU/RSV plus assay is intended as an aid in the diagnosis of influenza from Nasopharyngeal swab specimens and should not be used as a sole basis for treatment. Nasal washings and aspirates are unacceptable for Xpert Xpress SARS-CoV-2/FLU/RSV testing.  Fact Sheet for Patients: EntrepreneurPulse.com.au  Fact Sheet for Healthcare Providers: IncredibleEmployment.be  This test is not yet approved or cleared by the [place]  States FDA and has been authorized for detection and/or diagnosis of SARS-CoV-2 by FDA under an Emergency Use Authorization (EUA). This EUA will remain in effect (meaning this test can be used) for the duration of the COVID-19 declaration under Section 564(b)(1) of the Act, 21 U.S.C. section 360bbb-3(b)(1), unless the authorization is terminated or revoked.  Performed at [facility], [address] [place], [place] [address]     Labs: CBC: Recent Labs  Lab 01/10/22 1637 01/11/22 0424  WBC 16.0* 12.0*   HGB 14.3 12.7  HCT 42.4 37.4  MCV 86.5 86.6  PLT 330 970   Basic Metabolic Panel: Recent Labs  Lab 01/10/22 1637 01/10/22 1830 01/11/22 0424  NA 133*  --  133*  K 3.9  --  3.4*  CL 97*  --  97*  CO2 26  --  25  GLUCOSE 151*  --  122*  BUN 12  --  18  CREATININE 0.79  --  0.91  CALCIUM 9.3  --  9.1  MG  --  1.9 1.9   Liver Function Tests: No results for input(s): "AST", "ALT", "ALKPHOS", "BILITOT", "PROT", "ALBUMIN" in the last 168 hours. CBG: No results for input(s): "GLUCAP" in the last 168 hours.  Discharge time spent: greater than 30 minutes.  Signed: Estill Cotta, MD [facility] 01/11/2022

## 2022-01-11 NOTE — H&P (Signed)
History and Physical    Dorothy Powers:[id number] DOB: 06/01/1946 DOA: 01/10/2022  PCP: Orpah Melter, MD   Patient coming from: Home   Chief Complaint: Chest discomfort   HPI: Dorothy Powers is a pleasant 76 y.o. female with medical history significant for hypertension, hyperlipidemia, tachycardia, and history of TIA who presents to the emergency department with chest discomfort.  Patient reports that she was sitting in a chair watching TV yesterday evening when she developed cute onset of discomfort in her chest and right shoulder.  She had never experienced this before, did not feel palpitations, but had ongoing symptoms this morning, checked her blood pressure, and noted that her heart rate was in the 90s when it is usually in the 70s.  She felt her pulse and noticed that it was irregular, and this is what prompted her to seek evaluation in the emergency department.  She denies any shortness of breath or leg swelling, denies any regular or significant alcohol use, does not drink much caffeine, and has never had any problems with bleeding.  She reports a 40-year or more history of tachycardia for which she is on atenolol but denies history of atrial fibrillation.  She has not seen a cardiologist in 40 years per her report.  ED Course: Upon arrival to the ED, patient is found to be afebrile and saturating well on room air with heart rate in the 259D and systolic pressure in the low 100s.  EKG features atrial fibrillation with rate 121, LAD, and RBBB.  Troponin was normal x2 and BNP slightly elevated.  CTA chest was negative for PE but notable for mild cardiomegaly with small pericardial effusion and patchy groundglass opacities bilaterally that likely reflect mild edema.  She was treated with 15 mg IV diltiazem and started on diltiazem infusion.  Review of Systems:  All other systems reviewed and apart from HPI, are negative.  Past Medical History:  Diagnosis Date   Hypertension     Meningioma ([place])    Tachycardia    TIA (transient ischemic attack)     Past Surgical History:  Procedure Laterality Date   ABDOMINAL HYSTERECTOMY     BUNIONECTOMY Right 11/2020    Social History:   reports that she has quit smoking. Her smoking use included cigarettes. She has never used smokeless tobacco. She reports current alcohol use. She reports that she does not use drugs.  Allergies  Allergen Reactions   Codeine Other (See Comments)    "Couldn't move one night for a short time, after taking the medicaiton"   Lovastatin Other (See Comments)    Other reaction(s): leg pain   Sulfa Antibiotics Other (See Comments)    "Felt weak"    Family History  Problem Relation Age of Onset   Heart disease Mother        pacemaker   Heart attack Father      Prior to Admission medications   Medication Sig Start Date End Date Taking? Authorizing Provider  albuterol (VENTOLIN HFA) 108 (90 Base) MCG/ACT inhaler Inhale 1-2 puffs into the lungs every 6 (six) hours as needed for wheezing.   Yes [provider]  Ascorbic Acid (VITAMIN C PO) Take 1 tablet by mouth daily.   Yes [provider]  atenolol (TENORMIN) 50 MG tablet Take 50 mg by mouth 2 (two) times daily. 09/02/20  Yes [provider]  azelastine (ASTELIN) 0.1 % nasal spray Place 2 sprays into both nostrils daily as needed for allergies. 08/02/20  Yes [provider]  calcium citrate (CALCITRATE - DOSED IN MG ELEMENTAL CALCIUM) 950 (200 Ca) MG tablet Take 200 mg of elemental calcium by mouth daily.   Yes [provider]  cetirizine (ZYRTEC) 10 MG tablet Take 10 mg by mouth daily.   Yes [provider]  Cholecalciferol (VITAMIN D3) 125 MCG (5000 UT) TABS Take 1 tablet by mouth daily.   Yes [provider]  clopidogrel (PLAVIX) 75 MG tablet Take 1 tablet (75 mg total) by mouth daily. 02/15/21 02/15/22 Yes Bonnielee Haff, MD  Coenzyme Q10 (CO Q 10 PO) Take 1 tablet by mouth  daily.   Yes [provider]  doxycycline (VIBRAMYCIN) 50 MG capsule Take 50 mg by mouth daily.   Yes [provider]  ezetimibe (ZETIA) 10 MG tablet Take 10 mg by mouth daily. 06/27/20  Yes [provider]  fluticasone (FLONASE) 50 MCG/ACT nasal spray Place 1 spray into both nostrils daily as needed for allergies.   Yes [provider]  glucosamine-chondroitin 500-400 MG tablet Take 1 tablet by mouth in the morning and at bedtime.   Yes [provider]  hydrochlorothiazide (MICROZIDE) 12.5 MG capsule Take 12.5 mg by mouth daily.   Yes [provider]  lisinopril (ZESTRIL) 40 MG tablet Take 40 mg by mouth daily.   Yes [provider]  ondansetron (ZOFRAN) 4 MG tablet Take 1 tablet (4 mg total) by mouth every 8 (eight) hours as needed. Patient taking differently: Take 4 mg by mouth every 8 (eight) hours as needed for vomiting or nausea. 06/13/21  Yes Hyatt, Max T, DPM  pantoprazole (PROTONIX) 40 MG tablet Take 40 mg by mouth daily. 09/20/20  Yes [provider]  rosuvastatin (CRESTOR) 10 MG tablet Take 10 mg by mouth daily.   Yes [provider]  calcium carbonate (OSCAL) 1500 (600 Ca) MG TABS tablet Take by mouth 2 (two) times daily with a meal. Patient not taking: Reported on 01/10/2022    [provider]    Physical Exam: Vitals:   01/11/22 0000 01/11/22 0030 01/11/22 0045 01/11/22 0100  BP: (!) 100/46 98/66 120/61 (!) 105/59  Pulse: 82 81 79 80  Resp: '17 17 18 '$ (!) 21  Temp:      TempSrc:      SpO2: 93% 95% 94% 95%  Weight:      Height:        Constitutional: NAD, calm  Eyes: PERTLA, lids and conjunctivae normal ENMT: Mucous membranes are moist. Posterior pharynx clear of any exudate or lesions.   Neck: supple, no masses  Respiratory: no wheezing, no crackles. No accessory muscle use.  Cardiovascular: Rate ~80 and irregularly irregular. No extremity edema.  Abdomen: No distension, no tenderness,  soft. Bowel sounds active.  Musculoskeletal: no clubbing / cyanosis. No joint deformity upper and lower extremities.   Skin: no significant rashes, lesions, ulcers. Warm, dry, well-perfused. Neurologic: CN 2-12 grossly intact. Moving all extremities. Alert and oriented.  Psychiatric: Pleasant. Cooperative.    Labs and Imaging on Admission: I have personally reviewed following labs and imaging studies  CBC: Recent Labs  Lab 01/10/22 1637  WBC 16.0*  HGB 14.3  HCT 42.4  MCV 86.5  PLT 696   Basic Metabolic Panel: Recent Labs  Lab 01/10/22 1637 01/10/22 1830  NA 133*  --   K 3.9  --   CL 97*  --   CO2 26  --   GLUCOSE 151*  --   BUN 12  --  CREATININE 0.79  --   CALCIUM 9.3  --   MG  --  1.9   GFR: Estimated Creatinine Clearance: 64.1 mL/min (by C-G formula based on SCr of 0.79 mg/dL). Liver Function Tests: No results for input(s): "AST", "ALT", "ALKPHOS", "BILITOT", "PROT", "ALBUMIN" in the last 168 hours. No results for input(s): "LIPASE", "AMYLASE" in the last 168 hours. No results for input(s): "AMMONIA" in the last 168 hours. Coagulation Profile: No results for input(s): "INR", "PROTIME" in the last 168 hours. Cardiac Enzymes: No results for input(s): "CKTOTAL", "CKMB", "CKMBINDEX", "TROPONINI" in the last 168 hours. BNP (last 3 results) No results for input(s): "PROBNP" in the last 8760 hours. HbA1C: No results for input(s): "HGBA1C" in the last 72 hours. CBG: No results for input(s): "GLUCAP" in the last 168 hours. Lipid Profile: No results for input(s): "CHOL", "HDL", "LDLCALC", "TRIG", "CHOLHDL", "LDLDIRECT" in the last 72 hours. Thyroid Function Tests: No results for input(s): "TSH", "T4TOTAL", "FREET4", "T3FREE", "THYROIDAB" in the last 72 hours. Anemia Panel: No results for input(s): "VITAMINB12", "FOLATE", "FERRITIN", "TIBC", "IRON", "RETICCTPCT" in the last 72 hours. Urine analysis:    Component Value Date/Time   COLORURINE STRAW (A) 02/14/2021  0150   APPEARANCEUR CLEAR 02/14/2021 0150   LABSPEC 1.005 02/14/2021 0150   PHURINE 6.0 02/14/2021 0150   GLUCOSEU NEGATIVE 02/14/2021 0150   HGBUR SMALL (A) 02/14/2021 0150   BILIRUBINUR NEGATIVE 02/14/2021 0150   KETONESUR NEGATIVE 02/14/2021 0150   PROTEINUR NEGATIVE 02/14/2021 0150   NITRITE NEGATIVE 02/14/2021 0150   LEUKOCYTESUR NEGATIVE 02/14/2021 0150   Sepsis Labs: '@LABRCNTIP'$ (procalcitonin:4,lacticidven:4) )No results found for this or any previous visit (from the past 240 hour(s)).   Radiological Exams on Admission: CT Angio Chest PE W/Cm &/Or Wo Cm  Result Date: 01/10/2022 CLINICAL DATA:  Right-sided chest pain. EXAM: CT ANGIOGRAPHY CHEST WITH CONTRAST TECHNIQUE: Multidetector CT imaging of the chest was performed using the standard protocol during bolus administration of intravenous contrast. Multiplanar CT image reconstructions and MIPs were obtained to evaluate the vascular anatomy. RADIATION DOSE REDUCTION: This exam was performed according to the departmental dose-optimization program which includes automated exposure control, adjustment of the mA and/or kV according to patient size and/or use of iterative reconstruction technique. CONTRAST:  68m OMNIPAQUE IOHEXOL 350 MG/ML SOLN COMPARISON:  None Available. FINDINGS: Cardiovascular: Aorta is normal in size. Heart is mildly enlarged. There is a small pericardial effusion. There is adequate opacification of the pulmonary arteries to the segmental level. There are atherosclerotic calcifications of the aorta and coronary arteries. Mediastinum/Nodes: There is an enlarged subcarinal lymph node measuring 13 mm short axis. There are nonenlarged, but prominent paratracheal, AP window, prevascular and hilar lymph nodes. The esophagus and thyroid gland are within normal limits. Lungs/Pleura: There are patchy ground-glass opacities throughout both lungs diffusely. There is atelectasis in the bilateral lower lobes. There is no pleural effusion  or pneumothorax identified. Upper Abdomen: No acute abnormality. Musculoskeletal: No chest wall abnormality. No acute or significant osseous findings. Review of the MIP images confirms the above findings. IMPRESSION: 1. No evidence for pulmonary embolism. 2. Mild cardiomegaly with small pericardial effusion. 3. Patchy ground-glass opacities throughout both lungs favored as mild edema. 4. Enlarged subcarinal lymph node. Aortic Atherosclerosis (ICD10-I70.0). Electronically Signed   By: ARonney AstersM.D.   On: 01/10/2022 20:36   DG Chest 2 View  Result Date: 01/10/2022 CLINICAL DATA:  Chest pain EXAM: CHEST - 2 VIEW COMPARISON:  Radiograph 01/10/2022 FINDINGS: Unchanged cardiomediastinal silhouette. Low lung volumes with bibasilar subsegmental atelectasis.  No large pleural effusion. No pneumothorax. No acute osseous abnormality. IMPRESSION: Low lung volumes with bibasilar subsegmental atelectasis. No new airspace disease. Electronically Signed   By: Maurine Simmering M.D.   On: 01/10/2022 16:51    EKG: Independently reviewed. Atrial fibrillation, rate 121, LAD, RBBB.   Assessment/Plan   1. New atrial fibrillation with RVR  - Presents with chest discomfort and found to be in rapid atrial fibrillation with rate 120s  - She denies hx of a fib but reports tachycardia for 40 yrs on atenolol, has not seen cardiology in 20 yrs  - She had echo last August with preserved EF, moderate left atrial enlargement  - She was given 15 mg IV diltiazem in ED and started on diltiazem infusion, now with rate 80s on 5 mg/hr  - Plan to give her atenolol, stop the diltiazem infusion, titrate up atenolol as needed, start Eliquis for CHADS-VASc 7 (age x2, gender, TIA x2, HTN, HLD) and low bleeding risk, check TSH and update echo    2. Pericardial effusion  - Small pericardial effusion noted on CTA chest  - No fever, friction rub, or clinical tamponade - TSH and TTE ordered   3. Hypertension  - Continue atenolol and titrate up  as needed for rate-control, hold HCTZ and lisinopril for now    4. Hyperlipidemia  - Continue Crestor   DVT prophylaxis: Eliquis  Code Status: Full  Level of Care: Level of care: Progressive Family Communication: none present  Disposition Plan:  Patient is from: home  Anticipated d/c is to: Home  Anticipated d/c date is: 6/30 or 01/12/22  Patient currently: Pending rate-control, echo, TSH, transition to oral medications   Consults called: None  Admission status: Observation     Vianne Bulls, MD [facility]  01/11/2022, 2:29 AM

## 2022-01-17 DIAGNOSIS — K219 Gastro-esophageal reflux disease without esophagitis: Secondary | ICD-10-CM | POA: Diagnosis not present

## 2022-01-17 DIAGNOSIS — I10 Essential (primary) hypertension: Secondary | ICD-10-CM | POA: Diagnosis not present

## 2022-01-17 DIAGNOSIS — E7800 Pure hypercholesterolemia, unspecified: Secondary | ICD-10-CM | POA: Diagnosis not present

## 2022-01-22 ENCOUNTER — Other Ambulatory Visit (HOSPITAL_COMMUNITY): Payer: Self-pay | Admitting: Nurse Practitioner

## 2022-01-22 ENCOUNTER — Encounter (HOSPITAL_COMMUNITY): Payer: Self-pay | Admitting: Nurse Practitioner

## 2022-01-22 ENCOUNTER — Ambulatory Visit (HOSPITAL_COMMUNITY)
Admit: 2022-01-22 | Discharge: 2022-01-22 | Disposition: A | Discharge status: Home / Self Care | Payer: Medicare Other | Attending: Nurse Practitioner | Admitting: Nurse Practitioner

## 2022-01-22 VITALS — BP 144/82 | HR 107 | Ht 62.0 in | Wt 216.6 lb

## 2022-01-22 DIAGNOSIS — I4891 Unspecified atrial fibrillation: Secondary | ICD-10-CM | POA: Diagnosis not present

## 2022-01-22 DIAGNOSIS — Z7901 Long term (current) use of anticoagulants: Secondary | ICD-10-CM | POA: Diagnosis not present

## 2022-01-22 DIAGNOSIS — I10 Essential (primary) hypertension: Secondary | ICD-10-CM | POA: Diagnosis not present

## 2022-01-22 DIAGNOSIS — D6869 Other thrombophilia: Secondary | ICD-10-CM

## 2022-01-22 DIAGNOSIS — Z8673 Personal history of transient ischemic attack (TIA), and cerebral infarction without residual deficits: Secondary | ICD-10-CM | POA: Diagnosis not present

## 2022-01-22 DIAGNOSIS — I4510 Unspecified right bundle-branch block: Secondary | ICD-10-CM | POA: Diagnosis not present

## 2022-01-22 NOTE — Patient Instructions (Signed)
Morning of Cardioversion STOP by the office for EKG & Labs at 9:30am  Cardioversion scheduled for Monday July 24  - Arrive at the [facility] and go to admitting at [place] not eat or drink anything after midnight the night prior to your procedure.  - Take all your morning medication (except diabetic medications) with a sip of water prior to arrival.  - You will not be able to drive home after your procedure.  - Do NOT miss any doses of your blood thinner - if you should miss a dose please notify our office immediately.  - If you feel as if you go back into normal rhythm prior to scheduled cardioversion, please notify our office immediately. If your procedure is canceled in the cardioversion suite you will be charged a cancellation fee.

## 2022-01-22 NOTE — Progress Notes (Addendum)
Primary Care Physician: Orpah Melter, MD Referring Physician: [facility] f/u    Dorothy Powers is a 76 y.o. female with a h/o  HTN, HLP, tachycardia, history of TIA presented to ED with chest discomfort.  Patient was sitting in her chair watching TV evening before the admission when she dropped acute right-sided chest discomfort radiating to the right shoulder.  She did not feel any palpitations and continued to have ongoing symptoms.  She felt her pulse and noticed to be irregular and presented to ED.  She has 76 year old more history of tachycardia, on atenolol but denies any history of A-fib.  No prior cardiac work-up.  In ED EKG showed A-fib with rate 121, LAD, RBBB. CTA chest negative for PE but notable for mild cardiomegaly with small pericardial effusion and patchy groundglass opacities bilaterally that likely reflect mild edema.  Patient was given IV diltiazem load 15 mg and started on infusion. She was started on eliquis 5 mg bid for a CHA2DS2VASc  score of 6. She was discharged in rate controlled afib, resuming atenolol and cardizem drip was d/c. Echo showed EF of 60-65%.  She remains in afib today at 107 bpm with a RBBB. She is tolerating afib ok. She is being complaint with eliquis, counting first full day as 7/2. She has had one nosebleed since on drug but has had in the pat, discussed she may need to see ENT as a nose cautery may be needed,  as she has had nosebleeds in the past, usually from the rt nostril. No alcohol, no sleep apnea per husband, minimal caffeine.    Today, she denies symptoms of palpitations, chest pain, shortness of breath, orthopnea, PND, lower extremity edema, dizziness, presyncope, syncope, or neurologic sequela. The patient is tolerating medications without difficulties and is otherwise without complaint today.   Past Medical History:  Diagnosis Date   Hypertension    Meningioma ([facility])    Tachycardia    TIA (transient ischemic attack)    Past Surgical  History:  Procedure Laterality Date   ABDOMINAL HYSTERECTOMY     BUNIONECTOMY Right 11/2020    Current Outpatient Medications  Medication Sig Dispense Refill   albuterol (VENTOLIN HFA) 108 (90 Base) MCG/ACT inhaler Inhale 1-2 puffs into the lungs every 6 (six) hours as needed for wheezing.     apixaban (ELIQUIS) 5 MG TABS tablet Take 1 tablet (5 mg total) by mouth 2 (two) times daily. 60 tablet 3   Ascorbic Acid (VITAMIN C PO) Take 1 tablet by mouth daily.     atenolol (TENORMIN) 50 MG tablet Take 1 tablet (50 mg total) by mouth 2 (two) times daily. 60 tablet 3   azelastine (ASTELIN) 0.1 % nasal spray Place 2 sprays into both nostrils daily as needed for allergies.     calcium citrate (CALCITRATE - DOSED IN MG ELEMENTAL CALCIUM) 950 (200 Ca) MG tablet Take 200 mg of elemental calcium by mouth daily.     cetirizine (ZYRTEC) 10 MG tablet Take 10 mg by mouth daily.     Cholecalciferol (VITAMIN D3) 125 MCG (5000 UT) TABS Take 1 tablet by mouth daily.     Coenzyme Q10 (CO Q 10 PO) Take 1 tablet by mouth daily.     ezetimibe (ZETIA) 10 MG tablet Take 10 mg by mouth daily.     fluticasone (FLONASE) 50 MCG/ACT nasal spray Place 1 spray into both nostrils daily as needed for allergies.     furosemide (LASIX) 20 MG tablet Take 1 tablet (  20 mg total) by mouth daily as needed for fluid or edema. 30 tablet 11   glucosamine-chondroitin 500-400 MG tablet Take 1 tablet by mouth in the morning and at bedtime.     ondansetron (ZOFRAN) 4 MG tablet Take 1 tablet (4 mg total) by mouth every 8 (eight) hours as needed. (Patient taking differently: Take 4 mg by mouth every 8 (eight) hours as needed for vomiting or nausea.) 20 tablet 0   pantoprazole (PROTONIX) 40 MG tablet Take 40 mg by mouth daily.     rosuvastatin (CRESTOR) 10 MG tablet Take 10 mg by mouth daily.     No current facility-administered medications for this encounter.    Allergies  Allergen Reactions   Codeine Other (See Comments)    "Couldn't  move one night for a short time, after taking the medicaiton"   Lovastatin Other (See Comments)    Other reaction(s): leg pain   Sulfa Antibiotics Other (See Comments)    "Felt weak"    Social History   Socioeconomic History   Marital status: Married    Spouse name: Not on file   Number of children: 0   Years of education: college   Highest education level: Bachelor's degree (e.g., BA, AB, BS)  Occupational History   Occupation: Retired  Tobacco Use   Smoking status: Former    Types: Cigarettes   Smokeless tobacco: Never  Substance and Sexual Activity   Alcohol use: Yes    Comment: social   Drug use: Never   Sexual activity: Not on file  Other Topics Concern   Not on file  Social History Narrative   Lives at home with husband.   Right-handed.   No daily use of caffeine.   Social Determinants of Health   Financial Resource Strain: Not on file  Food Insecurity: Not on file  Transportation Needs: Not on file  Physical Activity: Not on file  Stress: Not on file  Social Connections: Not on file  Intimate Partner Violence: Not on file    Family History  Problem Relation Age of Onset   Heart disease Mother        pacemaker   Heart attack Father     ROS- All systems are reviewed and negative except as per the HPI above  Physical Exam: There were no vitals filed for this visit. Wt Readings from Last 3 Encounters:  01/10/22 100.2 kg  04/12/21 98 kg  02/14/21 96.2 kg    Labs: Lab Results  Component Value Date   NA 133 (L) 01/11/2022   K 3.4 (L) 01/11/2022   CL 97 (L) 01/11/2022   CO2 25 01/11/2022   GLUCOSE 122 (H) 01/11/2022   BUN 18 01/11/2022   CREATININE 0.91 01/11/2022   CALCIUM 9.1 01/11/2022   PHOS 4.4 02/14/2021   MG 1.9 01/11/2022   Lab Results  Component Value Date   INR 1.0 02/14/2021   Lab Results  Component Value Date   CHOL 203 (H) 02/14/2021   HDL 67 02/14/2021   LDLCALC 125 (H) 02/14/2021   TRIG 55 02/14/2021     GEN- The  patient is well appearing, alert and oriented x 3 today.   Head- normocephalic, atraumatic Eyes-  Sclera clear, conjunctiva pink Ears- hearing intact Oropharynx- clear Neck- supple, no JVP Lymph- no cervical lymphadenopathy Lungs- Clear to ausculation bilaterally, normal work of breathing Heart- Regular rate and rhythm, no murmurs, rubs or gallops, PMI not laterally displaced GI- soft, NT, ND, + BS Extremities- no  clubbing, cyanosis, or edema MS- no significant deformity or atrophy Skin- no rash or lesion Psych- euthymic mood, full affect Neuro- strength and sensation are intact  EKG- Vent. rate 107 BPM PR interval 216 ms QRS duration 128 ms QT/QTcB 352/469 ms P-R-T axes * -47 -17 Sinus tachycardia with 1st degree A-V block with Premature atrial complexes Left axis deviation Right bundle branch block Abnormal ECG When compared with ECG of 10-Jan-2022 16:24, PREVIOUS ECG IS PRESENT  Echo-1. Left ventricular ejection fraction, by estimation, is 60 to 65%. The  left ventricle has normal function. The left ventricle has no regional  wall motion abnormalities. Left ventricular diastolic parameters are  indeterminate.   2. Right ventricular systolic function is normal. The right ventricular  size is normal. There is normal pulmonary artery systolic pressure.   3. Left atrial size was mildly dilated.   4. The mitral valve is abnormal. No evidence of mitral valve  regurgitation. No evidence of mitral stenosis.   5. The aortic valve is normal in structure. There is moderate  calcification of the aortic valve. There is moderate thickening of the  aortic valve. Aortic valve regurgitation is not visualized. Aortic valve  sclerosis/calcification is present, without any   evidence of aortic stenosis.   6. The inferior vena cava is normal in size with greater than 50%  respiratory variability, suggesting right atrial pressure of 3 mmHg  Assessment and Plan:  1. New onset Afib  General  education re afib and risk factors discussed  EKG today shows afib with RBBB at 107 bpm HR/BP readings from home reviewed and pt has acceptable rate control She tried  to take 1/2 extra tab of atenolol one day for her BP and then had subsequent BP's in the 80' and 95'K systolic  Continue atenolol 50 mg bid   2. CHA2DS2VASc  of at least 6 Continue eliquis 5 mg bid  Bleeding precautions discussed   We discussed pursuing cardioversion after being on anticoagulation after 21 days, risk vrs benefit and she is in agreement I will see am of cardioversion with cbc/bmet/ekg  Reminded not to miss any anticoagulation and if so contact the office   Darrian Grzelak C. Jarick Harkins, [facility] [address] [place],  [id number] [phone]

## 2022-01-28 ENCOUNTER — Other Ambulatory Visit: Payer: Self-pay | Admitting: Allergy and Immunology

## 2022-01-28 ENCOUNTER — Encounter (HOSPITAL_COMMUNITY): Payer: Self-pay | Admitting: Cardiology

## 2022-01-28 ENCOUNTER — Ambulatory Visit
Admission: RE | Admit: 2022-01-28 | Discharge: 2022-01-28 | Disposition: A | Discharge status: Home / Self Care | Payer: Medicare Other | Source: Ambulatory Visit | Attending: Allergy and Immunology | Admitting: Allergy and Immunology

## 2022-01-28 DIAGNOSIS — J310 Chronic rhinitis: Secondary | ICD-10-CM | POA: Diagnosis not present

## 2022-01-28 DIAGNOSIS — R040 Epistaxis: Secondary | ICD-10-CM | POA: Diagnosis not present

## 2022-01-28 DIAGNOSIS — H1045 Other chronic allergic conjunctivitis: Secondary | ICD-10-CM | POA: Diagnosis not present

## 2022-01-28 DIAGNOSIS — R059 Cough, unspecified: Secondary | ICD-10-CM

## 2022-01-28 DIAGNOSIS — J300 Vasomotor rhinitis: Secondary | ICD-10-CM | POA: Diagnosis not present

## 2022-01-28 DIAGNOSIS — R0602 Shortness of breath: Secondary | ICD-10-CM | POA: Diagnosis not present

## 2022-01-29 DIAGNOSIS — I4891 Unspecified atrial fibrillation: Secondary | ICD-10-CM | POA: Diagnosis not present

## 2022-01-30 ENCOUNTER — Observation Stay (HOSPITAL_COMMUNITY): Payer: Medicare Other

## 2022-01-30 ENCOUNTER — Emergency Department (HOSPITAL_COMMUNITY): Payer: Medicare Other

## 2022-01-30 ENCOUNTER — Other Ambulatory Visit: Payer: Self-pay

## 2022-01-30 ENCOUNTER — Inpatient Hospital Stay (HOSPITAL_COMMUNITY)
Admission: EM | Admit: 2022-01-30 | Discharge: 2022-02-07 | DRG: 308 | Disposition: A | Discharge status: Home / Self Care | Payer: Medicare Other | Attending: Internal Medicine | Admitting: Internal Medicine

## 2022-01-30 DIAGNOSIS — J811 Chronic pulmonary edema: Secondary | ICD-10-CM | POA: Diagnosis not present

## 2022-01-30 DIAGNOSIS — I4819 Other persistent atrial fibrillation: Principal | ICD-10-CM | POA: Diagnosis present

## 2022-01-30 DIAGNOSIS — Z79899 Other long term (current) drug therapy: Secondary | ICD-10-CM

## 2022-01-30 DIAGNOSIS — Z882 Allergy status to sulfonamides status: Secondary | ICD-10-CM

## 2022-01-30 DIAGNOSIS — K219 Gastro-esophageal reflux disease without esophagitis: Secondary | ICD-10-CM | POA: Diagnosis present

## 2022-01-30 DIAGNOSIS — J90 Pleural effusion, not elsewhere classified: Secondary | ICD-10-CM | POA: Diagnosis present

## 2022-01-30 DIAGNOSIS — Z888 Allergy status to other drugs, medicaments and biological substances status: Secondary | ICD-10-CM

## 2022-01-30 DIAGNOSIS — R000 Tachycardia, unspecified: Secondary | ICD-10-CM | POA: Diagnosis present

## 2022-01-30 DIAGNOSIS — R7982 Elevated C-reactive protein (CRP): Secondary | ICD-10-CM | POA: Diagnosis present

## 2022-01-30 DIAGNOSIS — I452 Bifascicular block: Secondary | ICD-10-CM | POA: Diagnosis not present

## 2022-01-30 DIAGNOSIS — Z8673 Personal history of transient ischemic attack (TIA), and cerebral infarction without residual deficits: Secondary | ICD-10-CM | POA: Diagnosis not present

## 2022-01-30 DIAGNOSIS — I110 Hypertensive heart disease with heart failure: Secondary | ICD-10-CM | POA: Diagnosis not present

## 2022-01-30 DIAGNOSIS — Z8249 Family history of ischemic heart disease and other diseases of the circulatory system: Secondary | ICD-10-CM

## 2022-01-30 DIAGNOSIS — E669 Obesity, unspecified: Secondary | ICD-10-CM | POA: Diagnosis not present

## 2022-01-30 DIAGNOSIS — Z87891 Personal history of nicotine dependence: Secondary | ICD-10-CM | POA: Diagnosis not present

## 2022-01-30 DIAGNOSIS — I4891 Unspecified atrial fibrillation: Principal | ICD-10-CM

## 2022-01-30 DIAGNOSIS — Z20822 Contact with and (suspected) exposure to covid-19: Secondary | ICD-10-CM | POA: Diagnosis not present

## 2022-01-30 DIAGNOSIS — Z6836 Body mass index (BMI) 36.0-36.9, adult: Secondary | ICD-10-CM | POA: Diagnosis not present

## 2022-01-30 DIAGNOSIS — R0602 Shortness of breath: Secondary | ICD-10-CM | POA: Diagnosis not present

## 2022-01-30 DIAGNOSIS — I10 Essential (primary) hypertension: Secondary | ICD-10-CM | POA: Diagnosis present

## 2022-01-30 DIAGNOSIS — J9811 Atelectasis: Secondary | ICD-10-CM | POA: Diagnosis not present

## 2022-01-30 DIAGNOSIS — I5033 Acute on chronic diastolic (congestive) heart failure: Secondary | ICD-10-CM | POA: Diagnosis not present

## 2022-01-30 DIAGNOSIS — J189 Pneumonia, unspecified organism: Secondary | ICD-10-CM | POA: Diagnosis present

## 2022-01-30 DIAGNOSIS — I3139 Other pericardial effusion (noninflammatory): Secondary | ICD-10-CM | POA: Diagnosis not present

## 2022-01-30 DIAGNOSIS — Z885 Allergy status to narcotic agent status: Secondary | ICD-10-CM | POA: Diagnosis not present

## 2022-01-30 DIAGNOSIS — Z7901 Long term (current) use of anticoagulants: Secondary | ICD-10-CM

## 2022-01-30 DIAGNOSIS — E7800 Pure hypercholesterolemia, unspecified: Secondary | ICD-10-CM | POA: Diagnosis not present

## 2022-01-30 DIAGNOSIS — I700 Atherosclerosis of aorta: Secondary | ICD-10-CM | POA: Diagnosis not present

## 2022-01-30 DIAGNOSIS — D329 Benign neoplasm of meninges, unspecified: Secondary | ICD-10-CM | POA: Diagnosis present

## 2022-01-30 LAB — BASIC METABOLIC PANEL
Anion gap: 10 (ref 5–15)
BUN: 5 mg/dL — ABNORMAL LOW (ref 8–23)
CO2: 27 mmol/L (ref 22–32)
Calcium: 8.9 mg/dL (ref 8.9–10.3)
Chloride: 98 mmol/L (ref 98–111)
Creatinine, Ser: 0.53 mg/dL (ref 0.44–1.00)
GFR, Estimated: >60 mL/min (ref >60)
Glucose, Bld: 120 mg/dL — ABNORMAL HIGH (ref 70–99)
Potassium: 3.5 mmol/L (ref 3.5–5.1)
Sodium: 135 mmol/L (ref 135–145)

## 2022-01-30 LAB — CBC
HCT: 37.1 % (ref 36.0–46.0)
Hemoglobin: 12.2 g/dL (ref 12.0–15.0)
MCH: 28.2 pg (ref 26.0–34.0)
MCHC: 32.9 g/dL (ref 30.0–36.0)
MCV: 85.9 fL (ref 80.0–100.0)
Platelets: 441 10*3/uL — ABNORMAL HIGH (ref 150–400)
RBC: 4.32 MIL/uL (ref 3.87–5.11)
RDW: 13.2 % (ref 11.5–15.5)
WBC: 9.6 10*3/uL (ref 4.0–10.5)
nRBC: 0 % (ref 0.0–0.2)

## 2022-01-30 LAB — HEPATIC FUNCTION PANEL
ALT: 66 U/L — ABNORMAL HIGH (ref 0–44)
AST: 50 U/L — ABNORMAL HIGH (ref 15–41)
Albumin: 2.8 g/dL — ABNORMAL LOW (ref 3.5–5.0)
Alkaline Phosphatase: 92 U/L (ref 38–126)
Bilirubin, Direct: 0.3 mg/dL — ABNORMAL HIGH (ref 0.0–0.2)
Indirect Bilirubin: 1 mg/dL — ABNORMAL HIGH (ref 0.3–0.9)
Total Bilirubin: 1.3 mg/dL — ABNORMAL HIGH (ref 0.3–1.2)
Total Protein: 6.1 g/dL — ABNORMAL LOW (ref 6.5–8.1)

## 2022-01-30 LAB — CK: Total CK: 57 U/L (ref 38–234)

## 2022-01-30 LAB — MAGNESIUM: Magnesium: 2.2 mg/dL (ref 1.7–2.4)

## 2022-01-30 LAB — TROPONIN I (HIGH SENSITIVITY): Troponin I (High Sensitivity): 9 ng/L (ref <18)

## 2022-01-30 LAB — PHOSPHORUS: Phosphorus: 3.9 mg/dL (ref 2.5–4.6)

## 2022-01-30 LAB — BRAIN NATRIURETIC PEPTIDE: B Natriuretic Peptide: 183.9 pg/mL — ABNORMAL HIGH (ref 0.0–100.0)

## 2022-01-30 MED ORDER — DILTIAZEM LOAD VIA INFUSION
10.0000 mg | Freq: Once | INTRAVENOUS | Status: AC
  Administered 2022-01-30: 10 mg via INTRAVENOUS
  Filled 2022-01-30: qty 10

## 2022-01-30 MED ORDER — ACETAMINOPHEN 650 MG RE SUPP: 650.0000 mg | Freq: Four times a day (QID) | RECTAL | Status: DC | PRN

## 2022-01-30 MED ORDER — APIXABAN 5 MG PO TABS
5.0000 mg | ORAL_TABLET | Freq: Two times a day (BID) | ORAL | Status: DC
  Administered 2022-01-30 – 2022-02-07 (×16): 5 mg via ORAL
  Filled 2022-01-30 (×16): qty 1

## 2022-01-30 MED ORDER — EZETIMIBE 10 MG PO TABS
10.0000 mg | ORAL_TABLET | Freq: Every day | ORAL | Status: DC
  Administered 2022-01-31 – 2022-02-07 (×8): 10 mg via ORAL
  Filled 2022-01-30 (×9): qty 1

## 2022-01-30 MED ORDER — FUROSEMIDE 10 MG/ML IJ SOLN
40.0000 mg | Freq: Once | INTRAMUSCULAR | Status: AC
  Administered 2022-01-30: 40 mg via INTRAVENOUS
  Filled 2022-01-30: qty 4

## 2022-01-30 MED ORDER — DILTIAZEM HCL-DEXTROSE 125-5 MG/125ML-% IV SOLN (PREMIX)
5.0000 mg/h | INTRAVENOUS | Status: DC
  Administered 2022-01-30: 5 mg/h via INTRAVENOUS
  Administered 2022-01-31 (×2): 15 mg/h via INTRAVENOUS
  Administered 2022-01-31: 10 mg/h via INTRAVENOUS
  Filled 2022-01-30 (×5): qty 125

## 2022-01-30 MED ORDER — SODIUM CHLORIDE 0.9% FLUSH
3.0000 mL | Freq: Two times a day (BID) | INTRAVENOUS | Status: DC
  Administered 2022-01-31: 3 mL via INTRAVENOUS

## 2022-01-30 MED ORDER — HYDROCODONE-ACETAMINOPHEN 5-325 MG PO TABS: 1.0000 | ORAL_TABLET | ORAL | Status: DC | PRN

## 2022-01-30 MED ORDER — SODIUM CHLORIDE 0.9 % IV SOLN
250.0000 mL | INTRAVENOUS | Status: DC | PRN
Start: 2022-01-30 — End: 2022-02-01

## 2022-01-30 MED ORDER — POTASSIUM CHLORIDE CRYS ER 20 MEQ PO TBCR
40.0000 meq | EXTENDED_RELEASE_TABLET | Freq: Once | ORAL | Status: AC
  Administered 2022-01-30: 40 meq via ORAL
  Filled 2022-01-30: qty 2

## 2022-01-30 MED ORDER — IOHEXOL 350 MG/ML SOLN
65.0000 mL | Freq: Once | INTRAVENOUS | Status: AC | PRN
  Administered 2022-01-30: 65 mL via INTRAVENOUS

## 2022-01-30 MED ORDER — ACETAMINOPHEN 325 MG PO TABS
650.0000 mg | ORAL_TABLET | Freq: Four times a day (QID) | ORAL | Status: DC | PRN
  Administered 2022-02-02 – 2022-02-03 (×2): 650 mg via ORAL
  Filled 2022-01-30 (×2): qty 2

## 2022-01-30 MED ORDER — SODIUM CHLORIDE 0.9% FLUSH
3.0000 mL | INTRAVENOUS | Status: DC | PRN
Start: 2022-01-30 — End: 2022-02-01

## 2022-01-30 MED ORDER — ROSUVASTATIN CALCIUM 5 MG PO TABS
10.0000 mg | ORAL_TABLET | Freq: Every day | ORAL | Status: DC
  Administered 2022-01-31 – 2022-02-07 (×8): 10 mg via ORAL
  Filled 2022-01-30 (×8): qty 2

## 2022-01-30 MED ORDER — PANTOPRAZOLE SODIUM 40 MG PO TBEC
40.0000 mg | DELAYED_RELEASE_TABLET | Freq: Every day | ORAL | Status: DC
  Administered 2022-01-31 – 2022-02-07 (×8): 40 mg via ORAL
  Filled 2022-01-30 (×8): qty 1

## 2022-01-30 NOTE — Subjective & Objective (Signed)
Patient came to emergency department because chest x-ray showed bilateral pneumonia she was recently diagnosed with pneumonia on Monday endorses shortness of breath chest pain worse with inspiration and the generalized fatigue Recently was diagnosed with atrial fibrillation and supposed to undergo cardioversion 24 July. No associated fevers no nausea vomiting diarrhea She has been having worsening cough prescribed at home Ceftin [facility] [place] continue to feel worse throughout the week despite taking medication went to urgent care and chest x-ray now showed bilateral pneumonia she was sent to emergency department to further evaluate. On arrival noted to be heart rate up to 120s A-fib which is known diagnosis for which she is anticoagulated has been taking her medications

## 2022-01-30 NOTE — ED Provider Notes (Signed)
[place] EMERGENCY DEPARTMENT Provider Note   CSN: [id number] Arrival date & time: 01/30/22  1814     History  Chief Complaint  Patient presents with   Shortness of Breath   Atrial Fibrillation    Dorothy Powers is a 76 y.o. female.   Shortness of Breath Associated symptoms: cough   Atrial Fibrillation Associated symptoms include shortness of breath.  Patient presents for shortness of breath.  Her medical history includes HTN, GERD, arthritis, obesity, HLD, atrial fibrillation, TIA.  Her home medications include Eliquis, atenolol, Lasix.  These were initiated during a recent hospitalization for new onset A-fib 3 weeks ago.  She has been taking her Lasix twice a day.  She states that she has not taken any Lasix since she left the hospital.  She does continue to take her atenolol.  She tracks her heart rate at home.  It was briefly elevated yesterday.  This did resolve after she took her atenolol.  Patient developed a dry cough 1 week ago.  She has also experienced progressive shortness of breath.  Last night, she experienced orthopnea.  Over the past week, she has been seen in clinic and was started on an antibiotic for treatment of pneumonia 3 days ago.  She denies any fevers or chills.  She had a planned cardioversion on the 24th if she remained in A-fib.      Home Medications Prior to Admission medications   Medication Sig Start Date End Date Taking? Authorizing Provider  acetaminophen (TYLENOL) 650 MG CR tablet Take 1,300 mg by mouth at bedtime as needed for pain.    [provider]  albuterol (VENTOLIN HFA) 108 (90 Base) MCG/ACT inhaler Inhale 1-2 puffs into the lungs every 6 (six) hours as needed for wheezing.    [provider]  apixaban (ELIQUIS) 5 MG TABS tablet Take 1 tablet (5 mg total) by mouth 2 (two) times daily. 01/11/22   Rai, Vernelle Emerald, MD  Ascorbic Acid (VITAMIN C PO) Take 500 mg by mouth daily.    [provider]   atenolol (TENORMIN) 50 MG tablet Take 1 tablet (50 mg total) by mouth 2 (two) times daily. 01/11/22   Rai, Ripudeep K, MD  azelastine (ASTELIN) 0.1 % nasal spray Place 2 sprays into both nostrils daily as needed for allergies. 08/02/20   [provider]  CALCIUM-VITAMIN D PO Take 2 tablets by mouth daily. 600 mg / 20 mcg    [provider]  cetirizine (ZYRTEC) 10 MG tablet Take 10 mg by mouth daily.    [provider]  Cholecalciferol (VITAMIN D3) 125 MCG (5000 UT) TABS Take 5,000 Units by mouth daily.    [provider]  Coenzyme Q10 (CO Q 10 PO) Take 200 mg by mouth daily.    [provider]  diphenhydrAMINE (BENADRYL) 25 MG tablet Take 25-50 mg by mouth daily as needed (Cough).    [provider]  doxycycline (VIBRAMYCIN) 50 MG capsule Take 50 mg by mouth daily.    [provider]  ezetimibe (ZETIA) 10 MG tablet Take 10 mg by mouth daily. 06/27/20   [provider]  furosemide (LASIX) 20 MG tablet Take 1 tablet (20 mg total) by mouth daily as needed for fluid or edema. 01/11/22 01/11/23  Rai, Vernelle Emerald, MD  glucosamine-chondroitin 500-400 MG tablet Take 1 tablet by mouth in the morning and at bedtime. Triple strength    [provider]  ondansetron (ZOFRAN) 4 MG tablet  Take 1 tablet (4 mg total) by mouth every 8 (eight) hours as needed. Patient not taking: Reported on 01/28/2022 06/13/21   Hyatt, Max T, DPM  pantoprazole (PROTONIX) 40 MG tablet Take 40 mg by mouth daily. 09/20/20   [provider]  rosuvastatin (CRESTOR) 10 MG tablet Take 10 mg by mouth daily.    [provider]      Allergies    Codeine, Lovastatin, and Sulfa antibiotics    Review of Systems   Review of Systems  Constitutional:  Positive for fatigue.  Respiratory:  Positive for cough and shortness of breath.   Cardiovascular:  Positive for palpitations.  All other systems reviewed and are negative.   Physical Exam Updated  Vital Signs BP (!) 156/96   Pulse (!) 127   Temp 98.4 F (36.9 C) (Oral)   Resp (!) 28   SpO2 94%  Physical Exam Vitals and nursing note reviewed.  Constitutional:      General: She is not in acute distress.    Appearance: She is well-developed. She is not ill-appearing, toxic-appearing or diaphoretic.  HENT:     Head: Normocephalic and atraumatic.     Mouth/Throat:     Mouth: Mucous membranes are moist.     Pharynx: Oropharynx is clear.  Eyes:     Conjunctiva/sclera: Conjunctivae normal.  Cardiovascular:     Rate and Rhythm: Tachycardia present. Rhythm irregular.     Heart sounds: No murmur heard. Pulmonary:     Effort: Pulmonary effort is normal. No tachypnea, accessory muscle usage or respiratory distress.     Breath sounds: Examination of the right-lower field reveals decreased breath sounds. Examination of the left-lower field reveals decreased breath sounds. Decreased breath sounds present. No wheezing, rhonchi or rales.  Abdominal:     Palpations: Abdomen is soft.     Tenderness: There is no abdominal tenderness.  Musculoskeletal:        General: No swelling. Normal range of motion.     Cervical back: Normal range of motion and neck supple.     Right lower leg: No edema.     Left lower leg: No edema.  Skin:    General: Skin is warm and dry.     Coloration: Skin is not cyanotic or pale.  Neurological:     General: No focal deficit present.     Mental Status: She is alert and oriented to person, place, and time.  Psychiatric:        Mood and Affect: Mood normal.        Behavior: Behavior normal.     ED Results / Procedures / Treatments   Labs (all labs ordered are listed, but only abnormal results are displayed) Labs Reviewed  BASIC METABOLIC PANEL - Abnormal; Notable for the following components:      Result Value   Glucose, Bld 120 (*)    BUN 5 (*)    All other components within normal limits  CBC - Abnormal; Notable for the following components:    Platelets 441 (*)    All other components within normal limits  BRAIN NATRIURETIC PEPTIDE - Abnormal; Notable for the following components:   B Natriuretic Peptide 183.9 (*)    All other components within normal limits  MAGNESIUM    EKG EKG Interpretation  Date/Time:  Wednesday January 30 2022 18:47:59 EDT Ventricular Rate:  130 PR Interval:    QRS Duration: 128 QT Interval:  302 QTC Calculation: 444 R Axis:   -47 Text  Interpretation: Atrial fibrillation with rapid ventricular response Right bundle branch block Left anterior fascicular block Confirmed by Godfrey Pick [phone]) on 01/30/2022 8:11:44 PM  Radiology DG Chest 2 View  Result Date: 01/30/2022 CLINICAL DATA:  Shortness of breath EXAM: CHEST - 2 VIEW COMPARISON:  Previous studies including the examination done earlier today FINDINGS: Transverse diameter of Elnoria Howard is increased. There is interval decrease in pulmonary vascular congestion. There is interval decrease in pulmonary edema. There are no new focal infiltrates. There is pleural density in the lateral aspect of right mid lung field, possibly suggesting small loculated effusion or pleural thickening. There is blunting of both lateral CP angles suggesting small bilateral effusions. There is no pneumothorax. IMPRESSION: Cardiomegaly. There is interval decrease in pulmonary vascular congestion and pulmonary edema. There are no new focal infiltrates. Small bilateral pleural effusions. There is possible loculated effusion in the lateral aspect of right mid lung field. Electronically Signed   By: Elmer Picker M.D.   On: 01/30/2022 19:55    Procedures Procedures    Medications Ordered in ED Medications  diltiazem (CARDIZEM) 1 mg/mL load via infusion 10 mg (10 mg Intravenous Bolus from Bag 01/30/22 2115)    And  diltiazem (CARDIZEM) 125 mg in dextrose 5% 125 mL (1 mg/mL) infusion (5 mg/hr Intravenous New Bag/Given 01/30/22 2114)  apixaban (ELIQUIS) tablet 5 mg (has no administration  in time range)  potassium chloride SA (KLOR-CON M) CR tablet 40 mEq (40 mEq Oral Given 01/30/22 2110)  furosemide (LASIX) injection 40 mg (40 mg Intravenous Given 01/30/22 2108)    ED Course/ Medical Decision Making/ A&P                           Medical Decision Making Amount and/or Complexity of Data Reviewed Labs: ordered. Radiology: ordered.  Risk Prescription drug management. Decision regarding hospitalization.   This patient presents to the ED for concern of shortness of breath, this involves an extensive number of treatment options, and is a complaint that carries with it a high risk of complications and morbidity.  The differential diagnosis includes CHF, arrhythmia, valvular dysfunction, pericardial effusion, viral illness, pneumonia, PE   Co morbidities that complicate the patient evaluation  HTN, GERD, arthritis, obesity, HLD, atrial fibrillation, TIA   Additional history obtained:  Additional history obtained from patient's husband External records from outside source obtained and reviewed including EMR   Lab Tests:  I Ordered, and personally interpreted labs.  The pertinent results include: Low-normal potassium with otherwise normal electrolytes, normal hemoglobin, no leukocytosis, mildly elevated BNP   Imaging Studies ordered:  I ordered imaging studies including chest x-ray, CT chest I independently visualized and interpreted imaging which showed chest x-ray shows cardiomegaly without any focal consolidations.  CTA chest was pending at time of admission. I agree with the radiologist interpretation   Cardiac Monitoring: / EKG:  The patient was maintained on a cardiac monitor.  I personally viewed and interpreted the cardiac monitored which showed an underlying rhythm of: Atrial fibrillation with RVR   Consultations Obtained:  I requested consultation with the cardiologist,  and discussed lab and imaging findings as well as pertinent plan - they  recommend: Admission with cardiology consult in the morning   Problem List / ED Course / Critical interventions / Medication management  Patient is a pleasant 76 year old female presenting for shortness of breath.  Her recent medical history includes a new diagnosis of atrial fibrillation 3 weeks ago.  She  has been adherent to her Eliquis and atenolol.  She has not been taking her home Lasix.  She has been having worsening shortness of breath and a dry cough over the past week.  She had a short duration episode of tachycardia yesterday which was resolved with atenolol.  Patient arrives in the ED due to persistence and worsening of symptoms.  Upon arrival, she is found to be in atrial fibrillation with RVR.  Prior to being bedded in the ED, laboratory work-up was initiated.  Patient has normal electrolytes, normal hemoglobin, no leukocytosis.  BNP is mildly elevated.  On initial assessment, patient is resting comfortably.  She does not have increased work of breathing at rest.  She is able to speak in complete sentences.  A-fib with RVR is persistent.  Blood pressures are normal to slightly elevated.  On bedside ultrasound, patient does have a moderate-sized pericardial effusion without evidence of tamponade physiology.  Potassium replacement was given to optimize electrolyte levels.  Patient was given dose of Lasix and started on Cardizem bolus and gtt.  I spoke with cardiologist on-call to inform them of patient's presence in the ED and plan for admission, as well as her new pericardial effusion (not identified on echocardiogram 3 weeks ago).  On reassessment, patient remains tachycardic with normal blood pressures and no new symptoms.  Patient was admitted to hospitalist for further management. I ordered medication including Lasix for diuresis; potassium chloride for electrolyte optimization; Cardizem for rate control Reevaluation of the patient after these medicines showed that the patient stayed the  same I have reviewed the patients home medicines and have made adjustments as needed   Social Determinants of Health:  Has access to outpatient care  CRITICAL CARE Performed by: Godfrey Pick   Total critical care time: 35 minutes  Critical care time was exclusive of separately billable procedures and treating other patients.  Critical care was necessary to treat or prevent imminent or life-threatening deterioration.  Critical care was time spent personally by me on the following activities: development of treatment plan with patient and/or surrogate as well as nursing, discussions with consultants, evaluation of patient's response to treatment, examination of patient, obtaining history from patient or surrogate, ordering and performing treatments and interventions, ordering and review of laboratory studies, ordering and review of radiographic studies, pulse oximetry and re-evaluation of patient's condition.          Final Clinical Impression(s) / ED Diagnoses Final diagnoses:  Atrial fibrillation with RVR ([place])  Pericardial effusion    Rx / DC Orders ED Discharge Orders     None         Godfrey Pick, MD 02/01/22 1558

## 2022-01-30 NOTE — ED Provider Triage Note (Signed)
Emergency Medicine Provider Triage Evaluation Note  Dorothy Powers , a 76 y.o. female  was evaluated in triage.  Pt complains of shortness of breath and cough.  Patient states that she has had worsening symptoms since their onset on Monday.  She had a chest x-ray and was diagnosed with pneumonia and given outpatient antibiotics with she reports being cefdinir.  Her symptoms continue to get worse throughout the week.  She went to a walk-in clinic today had a chest x-ray that showed bilateral pneumonia and was sent to the emergency department for further evaluation.  Her heart rate upon arrival was in the high 120s in atrial fibrillation.  She has known diagnosis of atrial fibrillation is currently anticoagulated.  She has not missed any of her medication.  She is due for a cardioversion later this week.  She denies fever, chills, night sweats, chest pain, abdominal pain, nausea/vomiting/diarrhea, urinary symptoms, change in bowel habits.  Review of Systems  Positive: See above Negative:   Physical Exam  BP (!) 155/100 (BP Location: Right Arm)   Pulse (!) 128   Temp 98.4 F (36.9 C) (Oral)   Resp 20   SpO2 96%  Gen:   Awake, no distress   Resp:  Normal effort  MSK:   Moves extremities without difficulty  Other:  Abnormal rate and rhythm.  Patient tachycardic on exam.  Rales auscultated in bilateral lower lung fields.  1-2+ pitting edema noted bilateral lower extremities.  Medical Decision Making  Medically screening exam initiated at 7:26 PM.  Appropriate orders placed.  Priyah Schmuck Hove was informed that the remainder of the evaluation will be completed by another provider, this initial triage assessment does not replace that evaluation, and the importance of remaining in the ED until their evaluation is complete.     Wilnette Kales, [place] 01/30/22 1928

## 2022-01-30 NOTE — Assessment & Plan Note (Signed)
Appreciate cardiology consult echogram in the morning Check sed rate and CRP N.p.o. postmidnight in case needs procedures Per bedside echo performed by ER no evidence of tamponade Monitor closely for hemodynamic instability

## 2022-01-30 NOTE — H&P (Addendum)
Dorothy Powers YSA:[id number] DOB: 07/14/1946 DOA: 01/30/2022     PCP: Orpah Melter, MD   Outpatient Specialists:   CARDS:   Dr. Stanford Breed    Patient arrived to ER on 01/30/22 at 1814 Referred by Attending Godfrey Pick, MD   Patient coming from:    home Lives  With family    Chief Complaint:   Chief Complaint  Patient presents with   Shortness of Breath   Atrial Fibrillation    HPI: Dorothy Powers is a 76 y.o. female with medical history significant of HTN HLD A-fib history of TIA, meningioma  Presented with   shortness of breath not improving despite being treated for pneumonia with antibiotics Patient came to emergency department because chest x-ray showed bilateral pneumonia she was recently diagnosed with pneumonia on Monday endorses shortness of breath chest pain worse with inspiration and the generalized fatigue Recently was diagnosed with atrial fibrillation and supposed to undergo cardioversion 24 July. No associated fevers no nausea vomiting diarrhea She has been having worsening cough prescribed at home Ceftin [place] continue to feel worse throughout the week despite taking medication went to urgent care and chest x-ray now showed bilateral pneumonia she was sent to emergency department to further evaluate. On arrival noted to be heart rate up to 120s A-fib which is known diagnosis for which she is anticoagulated has been taking her medications Reports significant orthopnea at home which has now proved after administration of IV Lasix in ER No fevers no sick contacts no sputum productive of phlegm Does not smoke drinks only occasionally    in house  PCR testing  Pending  Lab Results  Component Value Date   [place] NEGATIVE 02/14/2021     Regarding pertinent Chronic problems:     Hyperlipidemia -  on statins  Zetia Crestor Lipid Panel     Component Value Date/Time   CHOL 203 (H) 02/14/2021 0553   TRIG 55 02/14/2021 0553   HDL 67  02/14/2021 0553   CHOLHDL 3.0 02/14/2021 0553   VLDL 11 02/14/2021 0553   LDLCALC 125 (H) 02/14/2021 0553    HTN on atenolol HCTZ lisinopril    DM 2 -  Lab Results  Component Value Date   HGBA1C 6.0 (H) 02/14/2021   diet controlled     obesity-   BMI Readings from Last 1 Encounters:  01/22/22 39.62 kg/m      A. Fib -  - CHA2DS2 vas score  6   current  on anticoagulation with  Eliquis,           -  Rate control:  Currently controlled with  atenolol       While in ER:  Bedside echo showed pericardial effusion CXr showing cardiomegaly and loculated effusion   CXR - Cardiomegaly. There is interval decrease in pulmonary vascular congestion and pulmonary edema. There are no new focal infiltrates. Small bilateral pleural effusions. There is possible loculated effusion in the lateral aspect of right mid lung field.v   CT  chest - ordered  Following Medications were ordered in ER: Medications  diltiazem (CARDIZEM) 1 mg/mL load via infusion 10 mg (10 mg Intravenous Bolus from Bag 01/30/22 2115)    And  diltiazem (CARDIZEM) 125 mg in dextrose 5% 125 mL (1 mg/mL) infusion (5 mg/hr Intravenous New Bag/Given 01/30/22 2114)  apixaban (ELIQUIS) tablet 5 mg (has no administration in time range)  potassium chloride SA (KLOR-CON M) CR tablet 40 mEq (40 mEq Oral Given 01/30/22  2110)  furosemide (LASIX) injection 40 mg (40 mg Intravenous Given 01/30/22 2108)    _______________________________________________________ ER Provider Called:  Cardiology   Dr.  Vickki Muff They Recommend admit to medicine  Will see in AM     ED Triage Vitals  Enc Vitals Group     BP 01/30/22 1851 (!) 155/100     Pulse Rate 01/30/22 1851 (!) 128     Resp 01/30/22 1851 20     Temp 01/30/22 1851 98.4 F (36.9 C)     Temp Source 01/30/22 1851 Oral     SpO2 01/30/22 1851 96 %     Weight --      Height --      Head Circumference --      Peak Flow --      Pain Score 01/30/22 1854 7     Pain Loc --      Pain Edu?  --      Excl. in [place]? --   TMAX(24)@     _________________________________________ Significant initial  Findings: Abnormal Labs Reviewed  BASIC METABOLIC PANEL - Abnormal; Notable for the following components:      Result Value   Glucose, Bld 120 (*)    BUN 5 (*)    All other components within normal limits  CBC - Abnormal; Notable for the following components:   Platelets 441 (*)    All other components within normal limits  BRAIN NATRIURETIC PEPTIDE - Abnormal; Notable for the following components:   B Natriuretic Peptide 183.9 (*)    All other components within normal limits     _________________________ Troponin  ordered ECG: Ordered Personally reviewed and interpreted by me showing: HR : 130 Rhythm: Atrial fibrillation with rapid ventricular response Right bundle branch block Left anterior fascicular block QTC 444  The recent clinical data is shown below. Vitals:   01/30/22 1851 01/30/22 2015 01/30/22 2032 01/30/22 2115  BP: (!) 155/100  (!) 147/85 (!) 156/96  Pulse: (!) 128 (!) 131 (!) 133 (!) 127  Resp: 20 (!) 21 (!) 25 (!) 28  Temp: 98.4 F (36.9 C)     TempSrc: Oral     SpO2: 96% 94% 95% 94%     WBC     Component Value Date/Time   WBC 9.6 01/30/2022 1904   LYMPHSABS 1.5 02/14/2021 0100   MONOABS 1.0 02/14/2021 0100   EOSABS 0.3 02/14/2021 0100   BASOSABS 0.1 02/14/2021 0100     Procalcitonin   Ordered  _______________________________________________ Hospitalist was called for admission for   Atrial fibrillation with RVR      Pericardial effusion, pleural effusion     The following Work up has been ordered so far:  Orders Placed This Encounter  Procedures   DG Chest 2 View   CT Angio Chest PE W and/or Wo Contrast   Basic metabolic panel   CBC   Brain natriuretic peptide   Magnesium   Document Height and Actual Weight   If O2 sat   Inpatient consult to Cardiology   Consult to hospitalist   EKG 12-Lead   ED EKG     OTHER Significant  initial  Findings:  labs showing:  Recent Labs  Lab 01/30/22 1904  NA 135  K 3.5  CO2 27  GLUCOSE 120*  BUN 5*  CREATININE 0.53  CALCIUM 8.9    Cr   stable,   Lab Results  Component Value Date   CREATININE 0.53 01/30/2022   CREATININE 0.91 01/11/2022  CREATININE 0.79 01/10/2022    No results for input(s): "AST", "ALT", "ALKPHOS", "BILITOT", "PROT", "ALBUMIN" in the last 168 hours. Lab Results  Component Value Date   CALCIUM 8.9 01/30/2022   PHOS 4.4 02/14/2021          Plt: Lab Results  Component Value Date   PLT 441 (H) 01/30/2022       Recent Labs  Lab 01/30/22 1904  WBC 9.6  HGB 12.2  HCT 37.1  MCV 85.9  PLT 441*    HG/HCT   stable,     Component Value Date/Time   HGB 12.2 01/30/2022 1904   HCT 37.1 01/30/2022 1904   MCV 85.9 01/30/2022 1904     BNP (last 3 results) Recent Labs    01/10/22 1855 01/30/22 1916  BNP 105.2* 183.9*      DM  labs:  HbA1C: Recent Labs    02/14/21 0553  HGBA1C 6.0*       CBG (last 3)  No results for input(s): "GLUCAP" in the last 72 hours.        Cultures: No results found for: "SDES", "SPECREQUEST", "CULT", "REPTSTATUS"   Radiological Exams on Admission: CT Angio Chest PE W and/or Wo Contrast  Result Date: 01/30/2022 CLINICAL DATA:  Concern for pulmonary embolism. EXAM: CT ANGIOGRAPHY CHEST WITH CONTRAST TECHNIQUE: Multidetector CT imaging of the chest was performed using the standard protocol during bolus administration of intravenous contrast. Multiplanar CT image reconstructions and MIPs were obtained to evaluate the vascular anatomy. RADIATION DOSE REDUCTION: This exam was performed according to the departmental dose-optimization program which includes automated exposure control, adjustment of the mA and/or kV according to patient size and/or use of iterative reconstruction technique. CONTRAST:  66m OMNIPAQUE IOHEXOL 350 MG/ML SOLN COMPARISON:  Head CT dated 01/10/2022. FINDINGS: Evaluation of this  exam is limited due to respiratory motion artifact. Cardiovascular: Top-normal cardiac size. Small pericardial effusion measuring 1 cm in thickness. Three vessel coronary vascular calcification. There is mild atherosclerotic calcification of the thoracic aorta. No aneurysmal dilatation. Evaluation of the pulmonary arteries is limited due to respiratory motion artifact. No pulmonary artery embolus identified. Mediastinum/Nodes: Mildly enlarged lymph nodes in the prevascular space measuring up to 12 mm short axis. No hilar adenopathy. The esophagus is grossly unremarkable. No mediastinal fluid collection. Lungs/Pleura: Small bilateral pleural effusions with fluid noted along the minor fissure. Left lung base subsegmental atelectasis. Pneumonia is not excluded. No pneumothorax. The central airways are patent. Upper Abdomen: No acute abnormality. Musculoskeletal: No acute osseous pathology. Review of the MIP images confirms the above findings. IMPRESSION: 1. No CT evidence of pulmonary artery embolus. 2. Small bilateral pleural effusions with left lung base subsegmental atelectasis. Pneumonia is not excluded. 3. Small pericardial effusion. 4. Aortic Atherosclerosis (ICD10-I70.0). Electronically Signed   By: AAnner CreteM.D.   On: 01/30/2022 23:25   DG Chest 2 View  Result Date: 01/30/2022 CLINICAL DATA:  Shortness of breath EXAM: CHEST - 2 VIEW COMPARISON:  Previous studies including the examination done earlier today FINDINGS: Transverse diameter of HElnoria Howardis increased. There is interval decrease in pulmonary vascular congestion. There is interval decrease in pulmonary edema. There are no new focal infiltrates. There is pleural density in the lateral aspect of right mid lung field, possibly suggesting small loculated effusion or pleural thickening. There is blunting of both lateral CP angles suggesting small bilateral effusions. There is no pneumothorax. IMPRESSION: Cardiomegaly. There is interval decrease in  pulmonary vascular congestion and pulmonary edema. There are no new  focal infiltrates. Small bilateral pleural effusions. There is possible loculated effusion in the lateral aspect of right mid lung field. Electronically Signed   By: Elmer Picker M.D.   On: 01/30/2022 19:55   _______________________________________________________________________________________________________ Latest   Blood pressure (!) 156/96, pulse (!) 127, temperature 98.4 F (36.9 C), temperature source Oral, resp. rate (!) 28, SpO2 94 %.   Vitals  labs and radiology finding personally reviewed  Review of Systems:    Pertinent positives include:  fatigue, shortness of breath at rest.  dyspnea on exertion  Constitutional:  No weight loss, night sweats, Fevers, chills weight loss  HEENT:  No headaches, Difficulty swallowing,Tooth/dental problems,Sore throat,  No sneezing, itching, ear ache, nasal congestion, post nasal drip,  Cardio-vascular:  No chest pain, Orthopnea, PND, anasarca, dizziness, palpitations.no Bilateral lower extremity swelling  GI:  No heartburn, indigestion, abdominal pain, nausea, vomiting, diarrhea, change in bowel habits, loss of appetite, melena, blood in stool, hematemesis Resp:  no , No excess mucus, no productive cough, No non-productive cough, No coughing up of blood.No change in color of mucus.No wheezing. Skin:  no rash or lesions. No jaundice GU:  no dysuria, change in color of urine, no urgency or frequency. No straining to urinate.  No flank pain.  Musculoskeletal:  No joint pain or no joint swelling. No decreased range of motion. No back pain.  Psych:  No change in mood or affect. No depression or anxiety. No memory loss.  Neuro: no localizing neurological complaints, no tingling, no weakness, no double vision, no gait abnormality, no slurred speech, no confusion  All systems reviewed and apart from [place] all are  negative _______________________________________________________________________________________________ Past Medical History:   Past Medical History:  Diagnosis Date   Hypertension    Meningioma ([place])    Tachycardia    TIA (transient ischemic attack)       Past Surgical History:  Procedure Laterality Date   ABDOMINAL HYSTERECTOMY     BUNIONECTOMY Right 11/2020    Social History:  Ambulatory   independently      reports that she has quit smoking. Her smoking use included cigarettes. She has never used smokeless tobacco. She reports current alcohol use. She reports that she does not use drugs.     Family History:   Family History  Problem Relation Age of Onset   Heart disease Mother        pacemaker   Heart attack Father    ______________________________________________________________________________________________ Allergies: Allergies  Allergen Reactions   Codeine Other (See Comments)    "Couldn't move one night for a short time, after taking the medicaiton"   Lovastatin Other (See Comments)    Other reaction(s): leg pain   Sulfa Antibiotics Other (See Comments)    "Felt weak"     Prior to Admission medications   Medication Sig Start Date End Date Taking? Authorizing Provider  acetaminophen (TYLENOL) 650 MG CR tablet Take 1,300 mg by mouth at bedtime as needed for pain.    [provider]  albuterol (VENTOLIN HFA) 108 (90 Base) MCG/ACT inhaler Inhale 1-2 puffs into the lungs every 6 (six) hours as needed for wheezing.    [provider]  apixaban (ELIQUIS) 5 MG TABS tablet Take 1 tablet (5 mg total) by mouth 2 (two) times daily. 01/11/22   Rai, Vernelle Emerald, MD  Ascorbic Acid (VITAMIN C PO) Take 500 mg by mouth daily.    [provider]  atenolol (TENORMIN) 50 MG tablet Take 1 tablet (50 mg total)  by mouth 2 (two) times daily. 01/11/22   Rai, Ripudeep K, MD  azelastine (ASTELIN) 0.1 % nasal spray Place 2 sprays into both nostrils daily  as needed for allergies. 08/02/20   [provider]  CALCIUM-VITAMIN D PO Take 2 tablets by mouth daily. 600 mg / 20 mcg    [provider]  cetirizine (ZYRTEC) 10 MG tablet Take 10 mg by mouth daily.    [provider]  Cholecalciferol (VITAMIN D3) 125 MCG (5000 UT) TABS Take 5,000 Units by mouth daily.    [provider]  Coenzyme Q10 (CO Q 10 PO) Take 200 mg by mouth daily.    [provider]  diphenhydrAMINE (BENADRYL) 25 MG tablet Take 25-50 mg by mouth daily as needed (Cough).    [provider]  doxycycline (VIBRAMYCIN) 50 MG capsule Take 50 mg by mouth daily.    [provider]  ezetimibe (ZETIA) 10 MG tablet Take 10 mg by mouth daily. 06/27/20   [provider]  furosemide (LASIX) 20 MG tablet Take 1 tablet (20 mg total) by mouth daily as needed for fluid or edema. 01/11/22 01/11/23  Rai, Vernelle Emerald, MD  glucosamine-chondroitin 500-400 MG tablet Take 1 tablet by mouth in the morning and at bedtime. Triple strength    [provider]  ondansetron (ZOFRAN) 4 MG tablet Take 1 tablet (4 mg total) by mouth every 8 (eight) hours as needed. Patient not taking: Reported on 01/28/2022 06/13/21   Hyatt, Max T, DPM  pantoprazole (PROTONIX) 40 MG tablet Take 40 mg by mouth daily. 09/20/20   [provider]  rosuvastatin (CRESTOR) 10 MG tablet Take 10 mg by mouth daily.    [provider]    ___________________________________________________________________________________________________ Physical Exam:    01/30/2022    9:15 PM 01/30/2022    8:32 PM 01/30/2022    8:15 PM  Vitals with BMI  Systolic 160 737   Diastolic 96 85   Pulse 106 133 131     1. General:  in No  Acute distress   Chronically ill   -appearing 2. Psychological: Alert and   Oriented 3. Head/ENT:   Moist   Mucous Membranes                          Head Non traumatic, neck supple                          Poor Dentition 4. SKIN:  normal   Skin turgor,  Skin clean Dry and intact no rash 5. Heart: irRegular rate and rhythm, rapid  no  Murmur, no Rub or gallop 6. Lungs:  no wheezes or crackles   7. Abdomen: Soft,  non-tender, Non distended   obese  bowel sounds present 8. Lower extremities: no clubbing, cyanosis, trace edema 9. Neurologically Grossly intact, moving all 4 extremities equally   10. MSK: Normal range of motion    Chart has been reviewed  ______________________________________________________________________________________________  Assessment/Plan  76 y.o. female with medical history significant of HTN HLD A-fib history of TIA, meningioma    Admitted for   Atrial fibrillation with RVR   Pericardial effusion doubt that ever had pneumonia most likely had evidence of CHF flush pulmonary edema in the setting of A-fib with RVR   Present on Admission:  Atrial fibrillation with RVR ([place])  Essential hypertension  Gastro-esophageal reflux disease without esophagitis  Pericardial effusion  Pure  hypercholesterolemia  Pleural effusion  CAP (community acquired pneumonia)     Atrial fibrillation with RVR ([place])  - Admit to step down on Cardizem drip       CHA2D-VASC score 6      Continue Eliquis       Check TSH      Cycle cardiac enzymes      Obtain ECHO      Cardiology consulted will see  in AM Recommend continue diltiazem drip and Eliquis   Essential hypertension Hold atenolol on diltiazem drip  Gastro-esophageal reflux disease without esophagitis Continue Protonix 40 mg daily  Pericardial effusion Appreciate cardiology consult echogram in the morning Check sed rate and CRP N.p.o. postmidnight in case needs procedures Per bedside echo performed by ER no evidence of tamponade Monitor closely for hemodynamic instability  Pure hypercholesterolemia Continue Crestor 10 mg p.o. daily and Zetia 10 mg p.o. daily  Pleural effusion Obtain CT chest to further evaluate if loculated may need IR  consult or CT surgery  CAP (community acquired pneumonia) Unclear if actually has pneumonia CT chest done could not exclude pneumonia.  Patient have had some cough but no fever no white blood cell count procalcitonin ordered.  For tonight cover with Rocephin and azithromycin Respiratory panel ordered    Other plan as per orders.  DVT prophylaxis:   apixaban (ELIQUIS) tablet 5 mg    Code Status:    Code Status: Prior FULL CODE  as per patient   I had personally discussed CODE STATUS with patient    Family Communication:   Family not at  Bedside    Disposition Plan:      To home once workup is complete and patient is stable   Following barriers for discharge:                             Vitals stable Pericardial effusion assessed by Cardiology                            Will need consultants to evaluate patient prior to discharge                       Would benefit from PT/OT eval prior to [place] called: cardiology is aware will see in AM  Admission status:  ED Disposition     ED Disposition  [place]: [place] [[id number]]  Level of Care: Progressive [102]  Admit to Progressive based on following criteria: CARDIOVASCULAR & THORACIC of moderate stability with acute coronary syndrome symptoms/low risk myocardial infarction/hypertensive urgency/arrhythmias/heart failure potentially compromising stability and stable post cardiovascular intervention patients.  May place patient in observation at [facility] or [place] if equivalent level of care is available:: No  Covid Evaluation: Asymptomatic - no recent exposure (last 10 days) testing not required  Diagnosis: Atrial fibrillation with RVR [facility]) [[id number]]  Admitting Physician: Toy Baker [[id number]]  Attending Physician: Toy Baker [[id number]]           Obs      Level of care      progressive tele  indefinitely please discontinue once patient no longer qualifies COVID-19 Labs    Lab Results  Component Value Date   [place] NEGATIVE 02/14/2021      Kyson Kupper 01/31/2022, 12:22 AM    [facility]     after 2 AM please page floor coverage PA If 7AM-7PM, please contact the day team taking care of the patient using [email or web address]   Patient was evaluated in the context of the global COVID-19 pandemic, which necessitated consideration that the patient might be at risk for infection with the SARS-CoV-2 virus that causes COVID-19. Institutional protocols and algorithms that pertain to the evaluation of patients at risk for COVID-19 are in a state of rapid change based on information released by regulatory bodies including the CDC and federal and state organizations. These policies and algorithms were followed during the patient's care.

## 2022-01-30 NOTE — Assessment & Plan Note (Signed)
Hold atenolol on diltiazem drip

## 2022-01-30 NOTE — Assessment & Plan Note (Signed)
-   Admit to step down on Cardizem drip       CHA2D-VASC score 6      Continue Eliquis       Check TSH      Cycle cardiac enzymes      Obtain ECHO      Cardiology consulted will see  in AM Recommend continue diltiazem drip and Eliquis

## 2022-01-30 NOTE — Assessment & Plan Note (Signed)
Obtain CT chest to further evaluate if loculated may need IR consult or CT surgery

## 2022-01-30 NOTE — Assessment & Plan Note (Addendum)
Continue Crestor 10 mg p.o. daily and Zetia 10 mg p.o. daily

## 2022-01-30 NOTE — Assessment & Plan Note (Signed)
Continue Protonix 40 mg daily

## 2022-01-30 NOTE — ED Triage Notes (Signed)
Pt here after repeat CXR today showed bilateral pneumonia. Pt reports she was dx w/ R side pneumonia on Monday but has been feeling shob, still having chest pain worse w/ inspiration and weakness. Pt also reports new diagnosis of afib and is scheduled for cardioversion on 7/24. PT denies fevers, N/V/D

## 2022-01-31 ENCOUNTER — Observation Stay (HOSPITAL_COMMUNITY): Payer: Medicare Other

## 2022-01-31 DIAGNOSIS — R7982 Elevated C-reactive protein (CRP): Secondary | ICD-10-CM | POA: Diagnosis present

## 2022-01-31 DIAGNOSIS — Z885 Allergy status to narcotic agent status: Secondary | ICD-10-CM | POA: Diagnosis not present

## 2022-01-31 DIAGNOSIS — Z79899 Other long term (current) drug therapy: Secondary | ICD-10-CM | POA: Diagnosis not present

## 2022-01-31 DIAGNOSIS — D329 Benign neoplasm of meninges, unspecified: Secondary | ICD-10-CM | POA: Diagnosis present

## 2022-01-31 DIAGNOSIS — J189 Pneumonia, unspecified organism: Secondary | ICD-10-CM | POA: Diagnosis not present

## 2022-01-31 DIAGNOSIS — R0602 Shortness of breath: Secondary | ICD-10-CM | POA: Diagnosis not present

## 2022-01-31 DIAGNOSIS — Z20822 Contact with and (suspected) exposure to covid-19: Secondary | ICD-10-CM | POA: Diagnosis present

## 2022-01-31 DIAGNOSIS — I4819 Other persistent atrial fibrillation: Secondary | ICD-10-CM

## 2022-01-31 DIAGNOSIS — Z7901 Long term (current) use of anticoagulants: Secondary | ICD-10-CM | POA: Diagnosis not present

## 2022-01-31 DIAGNOSIS — I10 Essential (primary) hypertension: Secondary | ICD-10-CM | POA: Diagnosis not present

## 2022-01-31 DIAGNOSIS — I110 Hypertensive heart disease with heart failure: Secondary | ICD-10-CM | POA: Diagnosis present

## 2022-01-31 DIAGNOSIS — E7800 Pure hypercholesterolemia, unspecified: Secondary | ICD-10-CM | POA: Diagnosis present

## 2022-01-31 DIAGNOSIS — I3139 Other pericardial effusion (noninflammatory): Secondary | ICD-10-CM | POA: Diagnosis present

## 2022-01-31 DIAGNOSIS — I4891 Unspecified atrial fibrillation: Secondary | ICD-10-CM | POA: Diagnosis present

## 2022-01-31 DIAGNOSIS — R000 Tachycardia, unspecified: Secondary | ICD-10-CM | POA: Diagnosis present

## 2022-01-31 DIAGNOSIS — Z882 Allergy status to sulfonamides status: Secondary | ICD-10-CM | POA: Diagnosis not present

## 2022-01-31 DIAGNOSIS — I452 Bifascicular block: Secondary | ICD-10-CM | POA: Diagnosis present

## 2022-01-31 DIAGNOSIS — J9811 Atelectasis: Secondary | ICD-10-CM | POA: Diagnosis present

## 2022-01-31 DIAGNOSIS — Z87891 Personal history of nicotine dependence: Secondary | ICD-10-CM | POA: Diagnosis not present

## 2022-01-31 DIAGNOSIS — Z6836 Body mass index (BMI) 36.0-36.9, adult: Secondary | ICD-10-CM | POA: Diagnosis not present

## 2022-01-31 DIAGNOSIS — Z8249 Family history of ischemic heart disease and other diseases of the circulatory system: Secondary | ICD-10-CM | POA: Diagnosis not present

## 2022-01-31 DIAGNOSIS — I5033 Acute on chronic diastolic (congestive) heart failure: Secondary | ICD-10-CM | POA: Diagnosis present

## 2022-01-31 DIAGNOSIS — K219 Gastro-esophageal reflux disease without esophagitis: Secondary | ICD-10-CM | POA: Diagnosis present

## 2022-01-31 DIAGNOSIS — Z888 Allergy status to other drugs, medicaments and biological substances status: Secondary | ICD-10-CM | POA: Diagnosis not present

## 2022-01-31 DIAGNOSIS — E669 Obesity, unspecified: Secondary | ICD-10-CM | POA: Diagnosis present

## 2022-01-31 DIAGNOSIS — Z8673 Personal history of transient ischemic attack (TIA), and cerebral infarction without residual deficits: Secondary | ICD-10-CM | POA: Diagnosis not present

## 2022-01-31 LAB — CBC
HCT: 36.2 % (ref 36.0–46.0)
Hemoglobin: 12 g/dL (ref 12.0–15.0)
MCH: 28.5 pg (ref 26.0–34.0)
MCHC: 33.1 g/dL (ref 30.0–36.0)
MCV: 86 fL (ref 80.0–100.0)
Platelets: 451 10*3/uL — ABNORMAL HIGH (ref 150–400)
RBC: 4.21 MIL/uL (ref 3.87–5.11)
RDW: 13.2 % (ref 11.5–15.5)
WBC: 11 10*3/uL — ABNORMAL HIGH (ref 4.0–10.5)
nRBC: 0 % (ref 0.0–0.2)

## 2022-01-31 LAB — RESPIRATORY PANEL BY PCR

## 2022-01-31 LAB — TROPONIN I (HIGH SENSITIVITY): Troponin I (High Sensitivity): 8 ng/L (ref <18)

## 2022-01-31 LAB — URINALYSIS, COMPLETE (UACMP) WITH MICROSCOPIC
Bacteria, UA: NONE SEEN
Bilirubin Urine: NEGATIVE
Glucose, UA: NEGATIVE mg/dL
Hgb urine dipstick: NEGATIVE
Ketones, ur: NEGATIVE mg/dL
Nitrite: NEGATIVE
Protein, ur: NEGATIVE mg/dL
Specific Gravity, Urine: 1.005 (ref 1.005–1.030)
pH: 7 (ref 5.0–8.0)

## 2022-01-31 LAB — COMPREHENSIVE METABOLIC PANEL
ALT: 59 U/L — ABNORMAL HIGH (ref 0–44)
AST: 40 U/L (ref 15–41)
Albumin: 2.8 g/dL — ABNORMAL LOW (ref 3.5–5.0)
Alkaline Phosphatase: 92 U/L (ref 38–126)
Anion gap: 12 (ref 5–15)
BUN: 6 mg/dL — ABNORMAL LOW (ref 8–23)
CO2: 29 mmol/L (ref 22–32)
Calcium: 8.8 mg/dL — ABNORMAL LOW (ref 8.9–10.3)
Chloride: 97 mmol/L — ABNORMAL LOW (ref 98–111)
Creatinine, Ser: 0.67 mg/dL (ref 0.44–1.00)
GFR, Estimated: >60 mL/min (ref >60)
Glucose, Bld: 129 mg/dL — ABNORMAL HIGH (ref 70–99)
Potassium: 3.5 mmol/L (ref 3.5–5.1)
Sodium: 138 mmol/L (ref 135–145)
Total Bilirubin: 1.1 mg/dL (ref 0.3–1.2)
Total Protein: 6.2 g/dL — ABNORMAL LOW (ref 6.5–8.1)

## 2022-01-31 LAB — PROCALCITONIN
Procalcitonin: <0.1 ng/mL
Procalcitonin: <0.1 ng/mL

## 2022-01-31 LAB — PREALBUMIN: Prealbumin: 7 mg/dL — ABNORMAL LOW (ref 18–38)

## 2022-01-31 LAB — C-REACTIVE PROTEIN: CRP: 11.4 mg/dL — ABNORMAL HIGH (ref <1.0)

## 2022-01-31 LAB — TSH: TSH: 1.415 u[IU]/mL (ref 0.350–4.500)

## 2022-01-31 LAB — ECHOCARDIOGRAM LIMITED
Height: 63 in
Weight: 3428.59 oz

## 2022-01-31 LAB — SARS CORONAVIRUS 2 BY RT PCR: SARS Coronavirus 2 by RT PCR: NEGATIVE

## 2022-01-31 LAB — SEDIMENTATION RATE: Sed Rate: 50 mm/hr — ABNORMAL HIGH (ref 0–22)

## 2022-01-31 MED ORDER — SODIUM CHLORIDE 0.9 % IV SOLN
500.0000 mg | INTRAVENOUS | Status: DC
  Administered 2022-01-31 – 2022-02-01 (×2): 500 mg via INTRAVENOUS
  Filled 2022-01-31 (×2): qty 5

## 2022-01-31 MED ORDER — POTASSIUM CHLORIDE CRYS ER 20 MEQ PO TBCR
40.0000 meq | EXTENDED_RELEASE_TABLET | Freq: Once | ORAL | Status: AC
  Administered 2022-01-31: 40 meq via ORAL
  Filled 2022-01-31: qty 2

## 2022-01-31 MED ORDER — SODIUM CHLORIDE 0.9 % IV SOLN
2.0000 g | Freq: Once | INTRAVENOUS | Status: AC
Start: 2022-01-31 — End: 2022-01-31
  Administered 2022-01-31: 2 g via INTRAVENOUS
  Filled 2022-01-31: qty 20

## 2022-01-31 MED ORDER — FUROSEMIDE 10 MG/ML IJ SOLN
40.0000 mg | Freq: Once | INTRAMUSCULAR | Status: AC
  Administered 2022-01-31: 40 mg via INTRAVENOUS
  Filled 2022-01-31: qty 4

## 2022-01-31 MED ORDER — DILTIAZEM HCL 60 MG PO TABS
90.0000 mg | ORAL_TABLET | Freq: Three times a day (TID) | ORAL | Status: DC
  Administered 2022-01-31 – 2022-02-02 (×7): 90 mg via ORAL
  Filled 2022-01-31 (×9): qty 1

## 2022-01-31 MED ORDER — SODIUM CHLORIDE 0.9 % IV SOLN
2.0000 g | INTRAVENOUS | Status: DC
  Administered 2022-01-31: 2 g via INTRAVENOUS
  Filled 2022-01-31: qty 20

## 2022-01-31 MED ORDER — SODIUM CHLORIDE 0.9 % IV SOLN: INTRAVENOUS | Status: DC

## 2022-01-31 NOTE — Consult Note (Addendum)
Cardiology Consult Note:   Patient ID: Dorothy Powers MRN: [id number]; DOB: 12-10-1945   Admission date: 01/30/2022  PCP:  Orpah Melter, MD   [facility] Providers Cardiologist:  None        Chief Complaint:  Shortness of breath  Patient Profile:   Dorothy Powers is a 76 y.o. female with newly diagnosed AF (on apixaban), hypertension, hyperlipidemia, and history of TIA who is being seen for the evaluation of AF with RVR and pericardial effusion.  History of Present Illness:   Ms. Dorothy Powers presents with persistent shortness of breath and cough that worsens when trying to take a deep breath in, as well as chest imaging showing possible bilateral pneumonia vs pleural effusion despite antibiotic treatment. She also describes some orthopnea and generalized weakness.  She was recently diagnosed with atrial fibrillation at the end of 06/ 2023 and was started on apixaban at that time with plans to cardiovert in the outpatient setting after 3 weeks (around 07/24). She has been adherent with her anticoagulation with no missed doses.    Past Medical History:  Diagnosis Date   Hypertension    Meningioma (HCC)    Tachycardia    TIA (transient ischemic attack)     Past Surgical History:  Procedure Laterality Date   ABDOMINAL HYSTERECTOMY     BUNIONECTOMY Right 11/2020     Medications Prior to Admission: Prior to Admission medications   Medication Sig Start Date End Date Taking? Authorizing Provider  acetaminophen (TYLENOL) 650 MG CR tablet Take 1,300 mg by mouth at bedtime as needed for pain.    [provider]  albuterol (VENTOLIN HFA) 108 (90 Base) MCG/ACT inhaler Inhale 1-2 puffs into the lungs every 6 (six) hours as needed for wheezing.    [provider]  apixaban (ELIQUIS) 5 MG TABS tablet Take 1 tablet (5 mg total) by mouth 2 (two) times daily. 01/11/22   Rai, Vernelle Emerald, MD  Ascorbic Acid (VITAMIN C PO) Take 500 mg by mouth daily.    [provider]  atenolol (TENORMIN) 50 MG tablet Take 1 tablet (50 mg total) by mouth 2 (two) times daily. 01/11/22   Rai, Ripudeep K, MD  azelastine (ASTELIN) 0.1 % nasal spray Place 2 sprays into both nostrils daily as needed for allergies. 08/02/20   [provider]  CALCIUM-VITAMIN D PO Take 2 tablets by mouth daily. 600 mg / 20 mcg    [provider]  cetirizine (ZYRTEC) 10 MG tablet Take 10 mg by mouth daily.    [provider]  Cholecalciferol (VITAMIN D3) 125 MCG (5000 UT) TABS Take 5,000 Units by mouth daily.    [provider]  Coenzyme Q10 (CO Q 10 PO) Take 200 mg by mouth daily.    [provider]  diphenhydrAMINE (BENADRYL) 25 MG tablet Take 25-50 mg by mouth daily as needed (Cough).    [provider]  doxycycline (VIBRAMYCIN) 50 MG capsule Take 50 mg by mouth daily.    [provider]  ezetimibe (ZETIA) 10 MG tablet Take 10 mg by mouth daily. 06/27/20   [provider]  furosemide (LASIX) 20 MG tablet Take 1 tablet (20 mg total) by mouth daily as needed for fluid or edema. 01/11/22 01/11/23  Rai, Vernelle Emerald, MD  glucosamine-chondroitin 500-400 MG tablet Take 1 tablet by mouth in the morning and at bedtime. Triple strength    [provider]  ondansetron (ZOFRAN) 4 MG tablet Take 1 tablet (4 mg  total) by mouth every 8 (eight) hours as needed. Patient not taking: Reported on 01/28/2022 06/13/21   Hyatt, Max T, DPM  pantoprazole (PROTONIX) 40 MG tablet Take 40 mg by mouth daily. 09/20/20   [provider]  rosuvastatin (CRESTOR) 10 MG tablet Take 10 mg by mouth daily.    [provider]     Allergies:    Allergies  Allergen Reactions   Codeine Other (See Comments)    "Couldn't move one night for a short time, after taking the medicaiton"   Lovastatin Other (See Comments)    Other reaction(s): leg pain   Sulfa Antibiotics Other (See Comments)    "Felt weak"    Social History:   Social  History   Socioeconomic History   Marital status: Married    Spouse name: Not on file   Number of children: 0   Years of education: college   Highest education level: Bachelor's degree (e.g., BA, AB, BS)  Occupational History   Occupation: Retired  Tobacco Use   Smoking status: Former    Types: Cigarettes   Smokeless tobacco: Never  Substance and Sexual Activity   Alcohol use: Yes    Comment: social   Drug use: Never   Sexual activity: Not on file  Other Topics Concern   Not on file  Social History Narrative   Lives at home with husband.   Right-handed.   No daily use of caffeine.   Social Determinants of Health   Financial Resource Strain: Not on file  Food Insecurity: Not on file  Transportation Needs: Not on file  Physical Activity: Not on file  Stress: Not on file  Social Connections: Not on file  Intimate Partner Violence: Not on file    Family History:   The patient's family history includes Heart attack in her father; Heart disease in her mother.    ROS:  Please see the history of present illness.  All other ROS reviewed and negative.     Physical Exam/Data:   Vitals:   01/31/22 0015 01/31/22 0115 01/31/22 0230 01/31/22 0300  BP: 125/63 (!) 145/76 111/74 114/80  Pulse: 97 (!) 114 (!) 113 99  Resp: '17 20 14 20  '$ Temp:      TempSrc:      SpO2: 98% 91% 91% 92%   No intake or output data in the 24 hours ending 01/31/22 0307    01/22/2022   10:56 AM 01/10/2022    4:29 PM 04/12/2021    2:50 PM  Last 3 Weights  Weight (lbs) 216 lb 9.6 oz 221 lb 216 lb  Weight (kg) 98.249 kg 100.245 kg 97.977 kg     There is no height or weight on file to calculate BMI.  General:  Well nourished, well developed, in no acute distress HEENT: normal Neck: JVD at angle of mandible Vascular: No carotid bruits; Distal pulses 2+ bilaterally   Cardiac:  irregularly irregular; no murmur/rubs/gallops appreciated Lungs: Bibasilar crackles, no wheezing or rhonchi  Abd: soft,  nontender, obese  Ext: 1+ lower extremity edema Musculoskeletal:  No deformities, BUE and BLE strength normal and equal Skin: warm and dry  Neuro:  No focal deficits noted Psych:  Normal affect, pleasant   EKG:  The ECG that was done and was personally reviewed.   Relevant CV Studies: 1. Left ventricular ejection fraction, by estimation, is 60 to 65%. The  left ventricle has normal function. The left ventricle has no regional  wall motion abnormalities. Left ventricular  diastolic parameters are  indeterminate.   2. Right ventricular systolic function is normal. The right ventricular  size is normal. There is normal pulmonary artery systolic pressure.   3. Left atrial size was mildly dilated.   4. The mitral valve is abnormal. No evidence of mitral valve  regurgitation. No evidence of mitral stenosis.   5. The aortic valve is normal in structure. There is moderate  calcification of the aortic valve. There is moderate thickening of the  aortic valve. Aortic valve regurgitation is not visualized. Aortic valve  sclerosis/calcification is present, without any   evidence of aortic stenosis.   6. The inferior vena cava is normal in size with greater than 50%  respiratory variability, suggesting right atrial pressure of 3 mmHg.   Laboratory Data:  High Sensitivity Troponin:   Recent Labs  Lab 01/10/22 1637 01/10/22 1830 01/30/22 2141 01/31/22 0140  TROPONINIHS '9 10 9 8      '$ Chemistry Recent Labs  Lab 01/30/22 1904 01/30/22 2045  NA 135  --   K 3.5  --   CL 98  --   CO2 27  --   GLUCOSE 120*  --   BUN 5*  --   CREATININE 0.53  --   CALCIUM 8.9  --   MG  --  2.2  GFRNONAA >60  --   ANIONGAP 10  --     Recent Labs  Lab 01/30/22 2141  PROT 6.1*  ALBUMIN 2.8*  AST 50*  ALT 66*  ALKPHOS 92  BILITOT 1.3*   Lipids No results for input(s): "CHOL", "TRIG", "HDL", "LABVLDL", "LDLCALC", "CHOLHDL" in the last 168 hours. Hematology Recent Labs  Lab 01/30/22 1904  WBC  9.6  RBC 4.32  HGB 12.2  HCT 37.1  MCV 85.9  MCH 28.2  MCHC 32.9  RDW 13.2  PLT 441*   Thyroid No results for input(s): "TSH", "FREET4" in the last 168 hours. BNP Recent Labs  Lab 01/30/22 1916  BNP 183.9*    DDimer No results for input(s): "DDIMER" in the last 168 hours.   Radiology/Studies:  CT Angio Chest PE W and/or Wo Contrast  Result Date: 01/30/2022 CLINICAL DATA:  Concern for pulmonary embolism. EXAM: CT ANGIOGRAPHY CHEST WITH CONTRAST TECHNIQUE: Multidetector CT imaging of the chest was performed using the standard protocol during bolus administration of intravenous contrast. Multiplanar CT image reconstructions and MIPs were obtained to evaluate the vascular anatomy. RADIATION DOSE REDUCTION: This exam was performed according to the departmental dose-optimization program which includes automated exposure control, adjustment of the mA and/or kV according to patient size and/or use of iterative reconstruction technique. CONTRAST:  46m OMNIPAQUE IOHEXOL 350 MG/ML SOLN COMPARISON:  Head CT dated 01/10/2022. FINDINGS: Evaluation of this exam is limited due to respiratory motion artifact. Cardiovascular: Top-normal cardiac size. Small pericardial effusion measuring 1 cm in thickness. Three vessel coronary vascular calcification. There is mild atherosclerotic calcification of the thoracic aorta. No aneurysmal dilatation. Evaluation of the pulmonary arteries is limited due to respiratory motion artifact. No pulmonary artery embolus identified. Mediastinum/Nodes: Mildly enlarged lymph nodes in the prevascular space measuring up to 12 mm short axis. No hilar adenopathy. The esophagus is grossly unremarkable. No mediastinal fluid collection. Lungs/Pleura: Small bilateral pleural effusions with fluid noted along the minor fissure. Left lung base subsegmental atelectasis. Pneumonia is not excluded. No pneumothorax. The central airways are patent. Upper Abdomen: No acute abnormality.  Musculoskeletal: No acute osseous pathology. Review of the MIP images confirms the above findings. IMPRESSION:  1. No CT evidence of pulmonary artery embolus. 2. Small bilateral pleural effusions with left lung base subsegmental atelectasis. Pneumonia is not excluded. 3. Small pericardial effusion. 4. Aortic Atherosclerosis (ICD10-I70.0). Electronically Signed   By: Anner Crete M.D.   On: 01/30/2022 23:25   DG Chest 2 View  Result Date: 01/30/2022 CLINICAL DATA:  Shortness of breath EXAM: CHEST - 2 VIEW COMPARISON:  Previous studies including the examination done earlier today FINDINGS: Transverse diameter of Elnoria Howard is increased. There is interval decrease in pulmonary vascular congestion. There is interval decrease in pulmonary edema. There are no new focal infiltrates. There is pleural density in the lateral aspect of right mid lung field, possibly suggesting small loculated effusion or pleural thickening. There is blunting of both lateral CP angles suggesting small bilateral effusions. There is no pneumothorax. IMPRESSION: Cardiomegaly. There is interval decrease in pulmonary vascular congestion and pulmonary edema. There are no new focal infiltrates. Small bilateral pleural effusions. There is possible loculated effusion in the lateral aspect of right mid lung field. Electronically Signed   By: Elmer Picker M.D.   On: 01/30/2022 19:55     Assessment and Plan:   Dorothy Powers is a 76 y.o. female with newly diagnosed AF (on apixaban), hypertension, hyperlipidemia, and history of TIA who is being seen for the evaluation of AF with RVR and pericardial effusion.  # AF with RVR # HFpEF :: I suspect that patient has some degree of diastolic dysfunction and is not tolerating AF especially in RVR. This is substantiated by her chest imaging of bilateral pleural effusions, elevated BNP, physical exam with rales and lower extremity pitting edema, as well as cough and orthopnea. As such, I believe  rhythm control is appropriate for her symptomatic relief. - Cont apixaban - Cont diltiazem gtt for rate control - Keep NPO at midnight for possible TEE +/- DCCV - Discuss possible antiarrhythmic drug options vs outpatient ablation -  Agree with diuresis and would monitor strict I&Os and daily weights    # Pericardial Effusion :: Some concern for worsening pericardial effusion on bedside echo performed by ED team. - Will evaluate this with limited TTE in AM    Risk Assessment/Risk Scores:       New York Heart Association (NYHA) Functional Class NYHA Class II  CHA2DS2-VASc Score = 6   This indicates a 9.7% annual risk of stroke. The patient's score is based upon: CHF History: 0 HTN History: 1 Diabetes History: 0 Stroke History: 2 Vascular Disease History: 0 Age Score: 2 Gender Score: 1     For questions or updates, please contact [place] Please consult [email or web address] for contact info under     Signed, Clois Dupes, MD  01/31/2022 3:07 AM    Personally seen and examined. Agree with above.  She is still in atrial fibrillation with rapid ventricular response heart rates in the 120s to 130s.  We will go ahead and proceed with TEE cardioversion.  Check for availability.  She has not eaten anything since yesterday noon.  She has been on Eliquis without interruption. Risk and benefits of been explained.  She is willing to proceed.  Small pericardial effusion seen on CT scan of chest.  This is not causing any hemodynamic inhibition.  White count slightly elevated, CRP elevated compatible with inflammatory process, pneumonia.  She is currently breathing fairly comfortably.  I still think that restoration of sinus rhythm will be helpful as there is likely a component of diastolic heart failure.  Candee Furbish, MD   Candee Furbish, MD

## 2022-01-31 NOTE — Progress Notes (Signed)
PROGRESS NOTE                                                                                                                                                                                                             Patient Demographics:    Dorothy Powers, is a 76 y.o. female, DOB - [birth year], DHR:[id number]  Outpatient Primary MD for the patient is Orpah Melter, MD    LOS - 0  Admit date - 01/30/2022    Chief Complaint  Patient presents with   Shortness of Breath   Atrial Fibrillation       Brief Narrative (HPI from H&P)   76 y.o. female with medical history significant of HTN HLD A-fib history of TIA, meningioma.  Presented with few day history of cough, orthopnea and shortness of breath on exertion, she was diagnosed with pneumonia outpatient placed on antibiotics without any improvement.  In the ER she was diagnosed with CHF along with A-fib RVR and admitted to the hospital.    Subjective:    Dorothy Powers today has, No headache, No chest pain, No abdominal pain - No Nausea, No new weakness tingling or numbness, improved SOB.   Assessment  & Plan :    Paroxysmal atrial fibrillation in RVR [place] vas 2 score of 6.  Currently on IV Cardizem drip along with oral Eliquis, add oral Cardizem try to obtain rate control and titrate off IV Cardizem drip.  Cardiology on board planning to do cardioversion tomorrow.  Acute on chronic diastolic CHF with EF 46%.  Likely brought on by RVR, much improved with diuresis continue gentle Lasix and monitor.  Rate control as above.  Questionable history of pneumonia.  Not convincing.  She has no fever, dry cough, symptoms more consistent with CHF, CT chest shows atelectasis, CRP is elevated but this could be due to her underlying malignancy, she has been started on antibiotics upon admission will transition to oral and rapidly taper off.  Essential hypertension.  Cardizem IV and  p.o.  GERD.  PPI.  Paracardial effusion.  Defer to cardiology.  Echo noted.  No signs of tamponade.  Dyslipidemia.  On Crestor and Zetia.  Mild pleural effusion.  Diurese and monitor.  History of meningioma.  Outpatient follow-up.       Condition - Extremely Guarded  Family Communication  :  Husband Juleen China [phone] on 01/31/2022 at 12:01 PM   Code Status :  Full  Consults  :  Cards  PUD Prophylaxis : PPI   Procedures  :     TTE 01/11/22 -    1. Left ventricular ejection fraction, by estimation, is 60 to 65%. The left ventricle has normal function. The left ventricle has no regional wall motion abnormalities. Left ventricular diastolic parameters are indeterminate.   2. Right ventricular systolic function is normal. The right ventricular size is normal. There is normal pulmonary artery systolic pressure.   3. Left atrial size was mildly dilated.   4. The mitral valve is abnormal. No evidence of mitral valve regurgitation. No evidence of mitral stenosis.   5. The aortic valve is normal in structure. There is moderate calcification of the aortic valve. There is moderate thickening of the aortic valve. Aortic valve regurgitation is not visualized. Aortic valve sclerosis/calcification is present, without any   evidence of aortic stenosis.   6. The inferior vena cava is normal in size with greater than 50% respiratory variability, suggesting right atrial pressure of 3 mmHg.  CT - 1. No CT evidence of pulmonary artery embolus. 2. Small bilateral pleural effusions with left lung base subsegmental atelectasis. Pneumonia is not excluded. 3. Small pericardial effusion. 4. Aortic Atherosclerosis       Disposition Plan  :    Status is: Observation  DVT Prophylaxis  :     apixaban (ELIQUIS) tablet 5 mg    Lab Results  Component Value Date   PLT 451 (H) 01/31/2022    Diet :  Diet Order             Diet NPO time specified Except for: Sips with Meds  Diet effective  now                    Inpatient Medications  Scheduled Meds:  apixaban  5 mg Oral BID   diltiazem  90 mg Oral Q8H   ezetimibe  10 mg Oral Daily   pantoprazole  40 mg Oral Daily   rosuvastatin  10 mg Oral Daily   sodium chloride flush  3 mL Intravenous Q12H   Continuous Infusions:  sodium chloride     azithromycin Stopped (01/31/22 0425)   [START ON 02/01/2022] cefTRIAXone (ROCEPHIN)  IV     diltiazem (CARDIZEM) infusion 15 mg/hr (01/31/22 0550)   PRN Meds:.sodium chloride, acetaminophen **OR** acetaminophen, HYDROcodone-acetaminophen, sodium chloride flush  Time Spent in minutes  30   Lala Lund M.D on 01/31/2022 at 11:44 AM  To page go to [email or web address]   [facility] -  Office  [phone]  See all Orders from today for further details    Objective:   Vitals:   01/31/22 0856 01/31/22 0900 01/31/22 0915 01/31/22 1028  BP:  133/76  (!) 123/57  Pulse:  87 98   Resp:  (!) 21 (!) 23 (!) 23  Temp: 98 F (36.7 C)   97.9 F (36.6 C)  TempSrc: Oral   Oral  SpO2:  93% 93% 93%  Weight:    97.2 kg  Height:    '5\' 3"'$  (1.6 m)    Wt Readings from Last 3 Encounters:  01/31/22 97.2 kg  01/22/22 98.2 kg  01/10/22 100.2 kg     Intake/Output Summary (Last 24 hours) at 01/31/2022 1144 Last data filed at 01/31/2022 0425 Gross per 24 hour  Intake 250.66 ml  Output 600 ml  Net -349.34 ml  Physical Exam  Awake Alert, No new F.N deficits, Normal affect [place].AT,PERRAL Supple Neck, No JVD,   Symmetrical Chest wall movement, Good air movement bilaterally, CTAB iRRR,No Gallops,Rubs or new Murmurs,  +ve B.Sounds, Abd Soft, No tenderness,   No Cyanosis, Clubbing or edema        Data Review:    CBC Recent Labs  Lab 01/30/22 1904 01/31/22 0548  WBC 9.6 11.0*  HGB 12.2 12.0  HCT 37.1 36.2  PLT 441* 451*  MCV 85.9 86.0  MCH 28.2 28.5  MCHC 32.9 33.1  RDW 13.2 13.2    Electrolytes Recent Labs  Lab 01/30/22 1904 01/30/22 1916 01/30/22 2045  01/30/22 2141 01/31/22 0140 01/31/22 0548  NA 135  --   --   --   --  138  K 3.5  --   --   --   --  3.5  CL 98  --   --   --   --  97*  CO2 27  --   --   --   --  29  GLUCOSE 120*  --   --   --   --  129*  BUN 5*  --   --   --   --  6*  CREATININE 0.53  --   --   --   --  0.67  CALCIUM 8.9  --   --   --   --  8.8*  AST  --   --   --  50*  --  40  ALT  --   --   --  66*  --  59*  ALKPHOS  --   --   --  92  --  92  BILITOT  --   --   --  1.3*  --  1.1  ALBUMIN  --   --   --  2.8*  --  2.8*  MG  --   --  2.2  --   --   --   CRP  --   --   --   --   --  11.4*  PROCALCITON  --   --   --   --  <0.10 <0.10  BNP  --  183.9*  --   --   --   --     ------------------------------------------------------------------------------------------------------------------ No results for input(s): "CHOL", "HDL", "LDLCALC", "TRIG", "CHOLHDL", "LDLDIRECT" in the last 72 hours.  Lab Results  Component Value Date   HGBA1C 6.0 (H) 02/14/2021    No results for input(s): "TSH", "T4TOTAL", "T3FREE", "THYROIDAB" in the last 72 hours.  Invalid input(s): "FREET3" ------------------------------------------------------------------------------------------------------------------ ID Labs Recent Labs  Lab 01/30/22 1904 01/31/22 0140 01/31/22 0548  WBC 9.6  --  11.0*  PLT 441*  --  451*  CRP  --   --  11.4*  PROCALCITON  --  <0.10 <0.10  CREATININE 0.53  --  0.67   Radiology Reports DG Chest Port 1 View  Result Date: 01/31/2022 CLINICAL DATA:  Provided history: Shortness of breath, pneumonia. EXAM: PORTABLE CHEST 1 VIEW COMPARISON:  Prior chest radiographs 01/30/2022 and earlier. Chest CT 01/30/2022. FINDINGS: Persistently enlarged cardiopericardial silhouette. A pericardial effusion is noted on yesterday's chest CT. Aortic atherosclerosis. Persistent prominence of the interstitial lung markings, suggesting interstitial edema. Persistent small partially loculated right pleural effusion. Mild atelectasis  at the left lung base. No definite airspace consolidation. No evidence of pneumothorax. No acute bony abnormality identified. IMPRESSION: Persistent prominence of the cardiopericardial silhouette, likely  reflecting the presence of a pericardial effusion (as was demonstrated on yesterday's chest CT). Persistent prominence of the interstitial lung markings, suggesting interstitial edema. Persistent small partially loculated right pleural effusion. Mild atelectasis at the left lung base. No definite airspace consolidation. Electronically Signed   By: Kellie Simmering D.O.   On: 01/31/2022 08:08   CT Angio Chest PE W and/or Wo Contrast  Result Date: 01/30/2022 CLINICAL DATA:  Concern for pulmonary embolism. EXAM: CT ANGIOGRAPHY CHEST WITH CONTRAST TECHNIQUE: Multidetector CT imaging of the chest was performed using the standard protocol during bolus administration of intravenous contrast. Multiplanar CT image reconstructions and MIPs were obtained to evaluate the vascular anatomy. RADIATION DOSE REDUCTION: This exam was performed according to the departmental dose-optimization program which includes automated exposure control, adjustment of the mA and/or kV according to patient size and/or use of iterative reconstruction technique. CONTRAST:  52m OMNIPAQUE IOHEXOL 350 MG/ML SOLN COMPARISON:  Head CT dated 01/10/2022. FINDINGS: Evaluation of this exam is limited due to respiratory motion artifact. Cardiovascular: Top-normal cardiac size. Small pericardial effusion measuring 1 cm in thickness. Three vessel coronary vascular calcification. There is mild atherosclerotic calcification of the thoracic aorta. No aneurysmal dilatation. Evaluation of the pulmonary arteries is limited due to respiratory motion artifact. No pulmonary artery embolus identified. Mediastinum/Nodes: Mildly enlarged lymph nodes in the prevascular space measuring up to 12 mm short axis. No hilar adenopathy. The esophagus is grossly unremarkable. No  mediastinal fluid collection. Lungs/Pleura: Small bilateral pleural effusions with fluid noted along the minor fissure. Left lung base subsegmental atelectasis. Pneumonia is not excluded. No pneumothorax. The central airways are patent. Upper Abdomen: No acute abnormality. Musculoskeletal: No acute osseous pathology. Review of the MIP images confirms the above findings. IMPRESSION: 1. No CT evidence of pulmonary artery embolus. 2. Small bilateral pleural effusions with left lung base subsegmental atelectasis. Pneumonia is not excluded. 3. Small pericardial effusion. 4. Aortic Atherosclerosis (ICD10-I70.0). Electronically Signed   By: AAnner CreteM.D.   On: 01/30/2022 23:25   DG Chest 2 View  Result Date: 01/30/2022 CLINICAL DATA:  Shortness of breath EXAM: CHEST - 2 VIEW COMPARISON:  Previous studies including the examination done earlier today FINDINGS: Transverse diameter of HElnoria Howardis increased. There is interval decrease in pulmonary vascular congestion. There is interval decrease in pulmonary edema. There are no new focal infiltrates. There is pleural density in the lateral aspect of right mid lung field, possibly suggesting small loculated effusion or pleural thickening. There is blunting of both lateral CP angles suggesting small bilateral effusions. There is no pneumothorax. IMPRESSION: Cardiomegaly. There is interval decrease in pulmonary vascular congestion and pulmonary edema. There are no new focal infiltrates. Small bilateral pleural effusions. There is possible loculated effusion in the lateral aspect of right mid lung field. Electronically Signed   By: PElmer PickerM.D.   On: 01/30/2022 19:55   DG Chest 2 View  Result Date: 01/28/2022 CLINICAL DATA:  COUGH and shortness of breath since 3 weeks EXAM: CHEST - 2 VIEW COMPARISON:  January 10 2022 FINDINGS: The heart size and mediastinal contours are mildly prominent, stable. Thoracic aorta is ectatic. There is some perihilar stranding with  faint airspace opacities seen most prominent at the right lower lung zone. No pleural effusion. The visualized skeletal structures are unremarkable. IMPRESSION: Perihilar stranding with faint patchy airspace opacities most prominent at the right lower lung zone. The opacity likely on the basis of pneumonia at the right lower lung zone is new since the previous study.  Electronically Signed   By: Frazier Richards M.D.   On: 01/28/2022 16:33

## 2022-01-31 NOTE — Evaluation (Signed)
Occupational Therapy Evaluation Patient Details Name: Dorothy Powers MRN: [id number] DOB: 1946-02-10 Today's Date: 01/31/2022   History of Present Illness Pt is a 76 y/o female admitted secondary to a fib with RVR. Thought to have pericardial effusion. PMH includes HTN, TIA, and a fib.   Clinical Impression   Pt admitted for concerns listed above. PTA pt reported that she was independent with all ADL's and IADL's, including driving. At this time, pt presents with increased weakness and decreased activity tolerance. Pt was in afib throughout the session with highest HR noted as 134 bpm. Anticipate as pt's medical concerns resolve and she is able to mobilize more, she will reach independence with all ADL's and mobility quickly. Recommending Mobility specialist work with pt to assist with promoting mobility in the hospital. Pt has no further skilled OT needs at this time and acute OT will sign off.      Recommendations for follow up therapy are one component of a multi-disciplinary discharge planning process, led by the attending physician.  Recommendations may be updated based on patient status, additional functional criteria and insurance authorization.   Follow Up Recommendations  No OT follow up    Assistance Recommended at Discharge PRN  Patient can return home with the following Assistance with cooking/housework    Functional Status Assessment  Patient has had a recent decline in their functional status and demonstrates the ability to make significant improvements in function in a reasonable and predictable amount of time.  Equipment Recommendations  None recommended by OT    Recommendations for Other Services       Precautions / Restrictions Precautions Precautions: Other (comment) Precaution Comments: watch HR Restrictions Weight Bearing Restrictions: No      Mobility Bed Mobility Overal bed mobility: Needs Assistance Bed Mobility: Supine to Sit, Sit to Supine      Supine to sit: Supervision Sit to supine: Supervision   General bed mobility comments: supervision for safety.    Transfers Overall transfer level: Needs assistance Equipment used: None Transfers: Sit to/from Stand Sit to Stand: Min guard           General transfer comment: Min guard for safety to stand from elevated stretcher height      Balance Overall balance assessment: Mild deficits observed, not formally tested                                         ADL either performed or assessed with clinical judgement   ADL Overall ADL's : Needs assistance/impaired                                       General ADL Comments: supervision to min gaurd for safety due to decreased activity tolerance     Vision Baseline Vision/History: 1 Wears glasses Ability to See in Adequate Light: 0 Adequate Patient Visual Report: No change from baseline Vision Assessment?: No apparent visual deficits     Perception     Praxis      Pertinent Vitals/Pain Pain Assessment Pain Assessment: No/denies pain     Hand Dominance Right   Extremity/Trunk Assessment Upper Extremity Assessment Upper Extremity Assessment: Overall WFL for tasks assessed   Lower Extremity Assessment Lower Extremity Assessment: Defer to PT evaluation   Cervical / Trunk Assessment Cervical / Trunk Assessment:  Normal   Communication Communication Communication: No difficulties   Cognition Arousal/Alertness: Awake/alert Behavior During Therapy: WFL for tasks assessed/performed Overall Cognitive Status: Within Functional Limits for tasks assessed                                       General Comments  highest HR noted 134 with ambulation    Exercises     Shoulder Instructions      Home Living Family/patient expects to be discharged to:: Private residence Living Arrangements: Spouse/significant other Available Help at Discharge: Family Type of Home:  House Home Access: Stairs to enter Technical brewer of Steps: 2 Entrance Stairs-Rails: None Home Layout: Other (Comment) (2 steps with rail)     Bathroom Shower/Tub: Occupational psychologist: Handicapped height     Home Equipment: [place] in          Prior Functioning/Environment Prior Level of Function : Independent/Modified Independent;Driving                        OT Problem List: Decreased strength;Decreased activity tolerance;Cardiopulmonary status limiting activity      OT Treatment/Interventions:      OT Goals(Current goals can be found in the care plan section) Acute Rehab OT Goals Patient Stated Goal: To go home OT Goal Formulation: With patient Time For Goal Achievement: 01/31/22 Potential to Achieve Goals: Good  OT Frequency:      Co-evaluation              AM-PAC OT "6 Clicks" Daily Activity     Outcome Measure Help from another person eating meals?: A Little Help from another person taking care of personal grooming?: A Little Help from another person toileting, which includes using toliet, bedpan, or urinal?: A Little Help from another person bathing (including washing, rinsing, drying)?: A Little Help from another person to put on and taking off regular upper body clothing?: A Little Help from another person to put on and taking off regular lower body clothing?: A Little 6 Click Score: 18   End of Session Nurse Communication: Mobility status  Activity Tolerance: Patient tolerated treatment well Patient left: in bed;with call bell/phone within reach  OT Visit Diagnosis: Unsteadiness on feet (R26.81);Other abnormalities of gait and mobility (R26.89);Muscle weakness (generalized) (M62.81)                Time: 7096-2836 OT Time Calculation (min): 15 min Charges:  OT General Charges $OT Visit: 1 Visit OT Evaluation $OT Eval Low Complexity: 1 Low  Estela Vinal H., OTR/L Acute Rehabilitation  Jake Goodson Elane  Yolanda Bonine 01/31/2022, 10:58 AM

## 2022-01-31 NOTE — H&P (View-Only) (Signed)
Cardiology Consult Note:   Patient ID: Dorothy Powers MRN: [id number]; DOB: 10/18/1945   Admission date: 01/30/2022  PCP:  Orpah Melter, MD   [facility] Providers Cardiologist:  None        Chief Complaint:  Shortness of breath  Patient Profile:   Dorothy Powers is a 76 y.o. female with newly diagnosed AF (on apixaban), hypertension, hyperlipidemia, and history of TIA who is being seen for the evaluation of AF with RVR and pericardial effusion.  History of Present Illness:   Dorothy Powers presents with persistent shortness of breath and cough that worsens when trying to take a deep breath in, as well as chest imaging showing possible bilateral pneumonia vs pleural effusion despite antibiotic treatment. She also describes some orthopnea and generalized weakness.  She was recently diagnosed with atrial fibrillation at the end of 06/ 2023 and was started on apixaban at that time with plans to cardiovert in the outpatient setting after 3 weeks (around 07/24). She has been adherent with her anticoagulation with no missed doses.    Past Medical History:  Diagnosis Date   Hypertension    Meningioma (HCC)    Tachycardia    TIA (transient ischemic attack)     Past Surgical History:  Procedure Laterality Date   ABDOMINAL HYSTERECTOMY     BUNIONECTOMY Right 11/2020     Medications Prior to Admission: Prior to Admission medications   Medication Sig Start Date End Date Taking? Authorizing Provider  acetaminophen (TYLENOL) 650 MG CR tablet Take 1,300 mg by mouth at bedtime as needed for pain.    [provider]  albuterol (VENTOLIN HFA) 108 (90 Base) MCG/ACT inhaler Inhale 1-2 puffs into the lungs every 6 (six) hours as needed for wheezing.    [provider]  apixaban (ELIQUIS) 5 MG TABS tablet Take 1 tablet (5 mg total) by mouth 2 (two) times daily. 01/11/22   Rai, Vernelle Emerald, MD  Ascorbic Acid (VITAMIN C PO) Take 500 mg by mouth daily.    [provider]  atenolol (TENORMIN) 50 MG tablet Take 1 tablet (50 mg total) by mouth 2 (two) times daily. 01/11/22   Rai, Ripudeep K, MD  azelastine (ASTELIN) 0.1 % nasal spray Place 2 sprays into both nostrils daily as needed for allergies. 08/02/20   [provider]  CALCIUM-VITAMIN D PO Take 2 tablets by mouth daily. 600 mg / 20 mcg    [provider]  cetirizine (ZYRTEC) 10 MG tablet Take 10 mg by mouth daily.    [provider]  Cholecalciferol (VITAMIN D3) 125 MCG (5000 UT) TABS Take 5,000 Units by mouth daily.    [provider]  Coenzyme Q10 (CO Q 10 PO) Take 200 mg by mouth daily.    [provider]  diphenhydrAMINE (BENADRYL) 25 MG tablet Take 25-50 mg by mouth daily as needed (Cough).    [provider]  doxycycline (VIBRAMYCIN) 50 MG capsule Take 50 mg by mouth daily.    [provider]  ezetimibe (ZETIA) 10 MG tablet Take 10 mg by mouth daily. 06/27/20   [provider]  furosemide (LASIX) 20 MG tablet Take 1 tablet (20 mg total) by mouth daily as needed for fluid or edema. 01/11/22 01/11/23  Rai, Vernelle Emerald, MD  glucosamine-chondroitin 500-400 MG tablet Take 1 tablet by mouth in the morning and at bedtime. Triple strength    [provider]  ondansetron (ZOFRAN) 4 MG tablet Take 1 tablet (4 mg  total) by mouth every 8 (eight) hours as needed. Patient not taking: Reported on 01/28/2022 06/13/21   Hyatt, Max T, DPM  pantoprazole (PROTONIX) 40 MG tablet Take 40 mg by mouth daily. 09/20/20   [provider]  rosuvastatin (CRESTOR) 10 MG tablet Take 10 mg by mouth daily.    [provider]     Allergies:    Allergies  Allergen Reactions   Codeine Other (See Comments)    "Couldn't move one night for a short time, after taking the medicaiton"   Lovastatin Other (See Comments)    Other reaction(s): leg pain   Sulfa Antibiotics Other (See Comments)    "Felt weak"    Social History:   Social  History   Socioeconomic History   Marital status: Married    Spouse name: Not on file   Number of children: 0   Years of education: college   Highest education level: Bachelor's degree (e.g., BA, AB, BS)  Occupational History   Occupation: Retired  Tobacco Use   Smoking status: Former    Types: Cigarettes   Smokeless tobacco: Never  Substance and Sexual Activity   Alcohol use: Yes    Comment: social   Drug use: Never   Sexual activity: Not on file  Other Topics Concern   Not on file  Social History Narrative   Lives at home with husband.   Right-handed.   No daily use of caffeine.   Social Determinants of Health   Financial Resource Strain: Not on file  Food Insecurity: Not on file  Transportation Needs: Not on file  Physical Activity: Not on file  Stress: Not on file  Social Connections: Not on file  Intimate Partner Violence: Not on file    Family History:   The patient's family history includes Heart attack in her father; Heart disease in her mother.    ROS:  Please see the history of present illness.  All other ROS reviewed and negative.     Physical Exam/Data:   Vitals:   01/31/22 0015 01/31/22 0115 01/31/22 0230 01/31/22 0300  BP: 125/63 (!) 145/76 111/74 114/80  Pulse: 97 (!) 114 (!) 113 99  Resp: '17 20 14 20  '$ Temp:      TempSrc:      SpO2: 98% 91% 91% 92%   No intake or output data in the 24 hours ending 01/31/22 0307    01/22/2022   10:56 AM 01/10/2022    4:29 PM 04/12/2021    2:50 PM  Last 3 Weights  Weight (lbs) 216 lb 9.6 oz 221 lb 216 lb  Weight (kg) 98.249 kg 100.245 kg 97.977 kg     There is no height or weight on file to calculate BMI.  General:  Well nourished, well developed, in no acute distress HEENT: normal Neck: JVD at angle of mandible Vascular: No carotid bruits; Distal pulses 2+ bilaterally   Cardiac:  irregularly irregular; no murmur/rubs/gallops appreciated Lungs: Bibasilar crackles, no wheezing or rhonchi  Abd: soft,  nontender, obese  Ext: 1+ lower extremity edema Musculoskeletal:  No deformities, BUE and BLE strength normal and equal Skin: warm and dry  Neuro:  No focal deficits noted Psych:  Normal affect, pleasant   EKG:  The ECG that was done and was personally reviewed.   Relevant CV Studies: 1. Left ventricular ejection fraction, by estimation, is 60 to 65%. The  left ventricle has normal function. The left ventricle has no regional  wall motion abnormalities. Left ventricular  diastolic parameters are  indeterminate.   2. Right ventricular systolic function is normal. The right ventricular  size is normal. There is normal pulmonary artery systolic pressure.   3. Left atrial size was mildly dilated.   4. The mitral valve is abnormal. No evidence of mitral valve  regurgitation. No evidence of mitral stenosis.   5. The aortic valve is normal in structure. There is moderate  calcification of the aortic valve. There is moderate thickening of the  aortic valve. Aortic valve regurgitation is not visualized. Aortic valve  sclerosis/calcification is present, without any   evidence of aortic stenosis.   6. The inferior vena cava is normal in size with greater than 50%  respiratory variability, suggesting right atrial pressure of 3 mmHg.   Laboratory Data:  High Sensitivity Troponin:   Recent Labs  Lab 01/10/22 1637 01/10/22 1830 01/30/22 2141 01/31/22 0140  TROPONINIHS '9 10 9 8      '$ Chemistry Recent Labs  Lab 01/30/22 1904 01/30/22 2045  NA 135  --   K 3.5  --   CL 98  --   CO2 27  --   GLUCOSE 120*  --   BUN 5*  --   CREATININE 0.53  --   CALCIUM 8.9  --   MG  --  2.2  GFRNONAA >60  --   ANIONGAP 10  --     Recent Labs  Lab 01/30/22 2141  PROT 6.1*  ALBUMIN 2.8*  AST 50*  ALT 66*  ALKPHOS 92  BILITOT 1.3*   Lipids No results for input(s): "CHOL", "TRIG", "HDL", "LABVLDL", "LDLCALC", "CHOLHDL" in the last 168 hours. Hematology Recent Labs  Lab 01/30/22 1904  WBC  9.6  RBC 4.32  HGB 12.2  HCT 37.1  MCV 85.9  MCH 28.2  MCHC 32.9  RDW 13.2  PLT 441*   Thyroid No results for input(s): "TSH", "FREET4" in the last 168 hours. BNP Recent Labs  Lab 01/30/22 1916  BNP 183.9*    DDimer No results for input(s): "DDIMER" in the last 168 hours.   Radiology/Studies:  CT Angio Chest PE W and/or Wo Contrast  Result Date: 01/30/2022 CLINICAL DATA:  Concern for pulmonary embolism. EXAM: CT ANGIOGRAPHY CHEST WITH CONTRAST TECHNIQUE: Multidetector CT imaging of the chest was performed using the standard protocol during bolus administration of intravenous contrast. Multiplanar CT image reconstructions and MIPs were obtained to evaluate the vascular anatomy. RADIATION DOSE REDUCTION: This exam was performed according to the departmental dose-optimization program which includes automated exposure control, adjustment of the mA and/or kV according to patient size and/or use of iterative reconstruction technique. CONTRAST:  33m OMNIPAQUE IOHEXOL 350 MG/ML SOLN COMPARISON:  Head CT dated 01/10/2022. FINDINGS: Evaluation of this exam is limited due to respiratory motion artifact. Cardiovascular: Top-normal cardiac size. Small pericardial effusion measuring 1 cm in thickness. Three vessel coronary vascular calcification. There is mild atherosclerotic calcification of the thoracic aorta. No aneurysmal dilatation. Evaluation of the pulmonary arteries is limited due to respiratory motion artifact. No pulmonary artery embolus identified. Mediastinum/Nodes: Mildly enlarged lymph nodes in the prevascular space measuring up to 12 mm short axis. No hilar adenopathy. The esophagus is grossly unremarkable. No mediastinal fluid collection. Lungs/Pleura: Small bilateral pleural effusions with fluid noted along the minor fissure. Left lung base subsegmental atelectasis. Pneumonia is not excluded. No pneumothorax. The central airways are patent. Upper Abdomen: No acute abnormality.  Musculoskeletal: No acute osseous pathology. Review of the MIP images confirms the above findings. IMPRESSION:  1. No CT evidence of pulmonary artery embolus. 2. Small bilateral pleural effusions with left lung base subsegmental atelectasis. Pneumonia is not excluded. 3. Small pericardial effusion. 4. Aortic Atherosclerosis (ICD10-I70.0). Electronically Signed   By: Anner Crete M.D.   On: 01/30/2022 23:25   DG Chest 2 View  Result Date: 01/30/2022 CLINICAL DATA:  Shortness of breath EXAM: CHEST - 2 VIEW COMPARISON:  Previous studies including the examination done earlier today FINDINGS: Transverse diameter of Elnoria Howard is increased. There is interval decrease in pulmonary vascular congestion. There is interval decrease in pulmonary edema. There are no new focal infiltrates. There is pleural density in the lateral aspect of right mid lung field, possibly suggesting small loculated effusion or pleural thickening. There is blunting of both lateral CP angles suggesting small bilateral effusions. There is no pneumothorax. IMPRESSION: Cardiomegaly. There is interval decrease in pulmonary vascular congestion and pulmonary edema. There are no new focal infiltrates. Small bilateral pleural effusions. There is possible loculated effusion in the lateral aspect of right mid lung field. Electronically Signed   By: Elmer Picker M.D.   On: 01/30/2022 19:55     Assessment and Plan:   JAYLEAN BUENAVENTURA is a 76 y.o. female with newly diagnosed AF (on apixaban), hypertension, hyperlipidemia, and history of TIA who is being seen for the evaluation of AF with RVR and pericardial effusion.  # AF with RVR # HFpEF :: I suspect that patient has some degree of diastolic dysfunction and is not tolerating AF especially in RVR. This is substantiated by her chest imaging of bilateral pleural effusions, elevated BNP, physical exam with rales and lower extremity pitting edema, as well as cough and orthopnea. As such, I believe  rhythm control is appropriate for her symptomatic relief. - Cont apixaban - Cont diltiazem gtt for rate control - Keep NPO at midnight for possible TEE +/- DCCV - Discuss possible antiarrhythmic drug options vs outpatient ablation -  Agree with diuresis and would monitor strict I&Os and daily weights    # Pericardial Effusion :: Some concern for worsening pericardial effusion on bedside echo performed by ED team. - Will evaluate this with limited TTE in AM    Risk Assessment/Risk Scores:       New York Heart Association (NYHA) Functional Class NYHA Class II  CHA2DS2-VASc Score = 6   This indicates a 9.7% annual risk of stroke. The patient's score is based upon: CHF History: 0 HTN History: 1 Diabetes History: 0 Stroke History: 2 Vascular Disease History: 0 Age Score: 2 Gender Score: 1     For questions or updates, please contact [place] Please consult [email or web address] for contact info under     Signed, Clois Dupes, MD  01/31/2022 3:07 AM    Personally seen and examined. Agree with above.  She is still in atrial fibrillation with rapid ventricular response heart rates in the 120s to 130s.  We will go ahead and proceed with TEE cardioversion.  Check for availability.  She has not eaten anything since yesterday noon.  She has been on Eliquis without interruption. Risk and benefits of been explained.  She is willing to proceed.  Small pericardial effusion seen on CT scan of chest.  This is not causing any hemodynamic inhibition.  White count slightly elevated, CRP elevated compatible with inflammatory process, pneumonia.  She is currently breathing fairly comfortably.  I still think that restoration of sinus rhythm will be helpful as there is likely a component of diastolic heart failure.  Candee Furbish, MD   Candee Furbish, MD

## 2022-01-31 NOTE — Evaluation (Signed)
Physical Therapy Evaluation Patient Details Name: Dorothy Powers MRN: [id number] DOB: 1945/12/16 Today's Date: 01/31/2022  History of Present Illness  Pt is a 76 y/o female admitted secondary to a fib with RVR. Thought to have pericardial effusion. PMH includes HTN, TIA, and a fib.  Clinical Impression  Pt admitted secondary to problem above with deficits below. Pt requiring min guard for mobility tasks this session. HR elevating to 134 during short distance gait. RN notified. Anticipate pt will progress well and will not require follow up PT. Will continue to follow acutely.        Recommendations for follow up therapy are one component of a multi-disciplinary discharge planning process, led by the attending physician.  Recommendations may be updated based on patient status, additional functional criteria and insurance authorization.  Follow Up Recommendations No PT follow up      Assistance Recommended at Discharge Intermittent Supervision/Assistance  Patient can return home with the following       Equipment Recommendations None recommended by PT  Recommendations for Other Services       Functional Status Assessment Patient has had a recent decline in their functional status and demonstrates the ability to make significant improvements in function in a reasonable and predictable amount of time.     Precautions / Restrictions Precautions Precautions: Other (comment) Precaution Comments: watch HR Restrictions Weight Bearing Restrictions: No      Mobility  Bed Mobility Overal bed mobility: Needs Assistance Bed Mobility: Supine to Sit, Sit to Supine     Supine to sit: Supervision Sit to supine: Supervision   General bed mobility comments: supervision for safety.    Transfers Overall transfer level: Needs assistance Equipment used: None Transfers: Sit to/from Stand Sit to Stand: Min guard           General transfer comment: Min guard for safety to stand  from elevated stretcher height    Ambulation/Gait Ambulation/Gait assistance: Min guard Gait Distance (Feet): 10 Feet Assistive device: 1 person hand held assist Gait Pattern/deviations: Step-through pattern, Decreased stride length Gait velocity: Decreased     General Gait Details: Min guard for safety. No overt LOB noted. HR elevated to 134 with short distance ambulation. RN notified.  Stairs            Wheelchair Mobility    Modified Rankin (Stroke Patients Only)       Balance Overall balance assessment: Mild deficits observed, not formally tested                                           Pertinent Vitals/Pain Pain Assessment Pain Assessment: No/denies pain    Home Living Family/patient expects to be discharged to:: Private residence Living Arrangements: Spouse/significant other Available Help at Discharge: Family Type of Home: House Home Access: Stairs to enter Entrance Stairs-Rails: None Technical brewer of Steps: 2   Home Layout: Other (Comment) (2 steps with rail) Home Equipment: Shower seat - built in      Prior Function Prior Level of Function : Independent/Modified Independent;Driving                     Hand Dominance        Extremity/Trunk Assessment   Upper Extremity Assessment Upper Extremity Assessment: Defer to OT evaluation    Lower Extremity Assessment Lower Extremity Assessment: Generalized weakness    Cervical / Trunk  Assessment Cervical / Trunk Assessment: Normal  Communication   Communication: No difficulties  Cognition Arousal/Alertness: Awake/alert Behavior During Therapy: WFL for tasks assessed/performed Overall Cognitive Status: Within Functional Limits for tasks assessed                                          General Comments      Exercises     Assessment/Plan    PT Assessment Patient needs continued PT services  PT Problem List Decreased  strength;Decreased activity tolerance;Decreased mobility;Decreased knowledge of use of DME       PT Treatment Interventions Gait training;DME instruction;Stair training;Functional mobility training;Therapeutic exercise;Therapeutic activities;Balance training;Patient/family education    PT Goals (Current goals can be found in the Care Plan section)  Acute Rehab PT Goals Patient Stated Goal: to go home PT Goal Formulation: With patient Time For Goal Achievement: 02/14/22 Potential to Achieve Goals: Good    Frequency Min 3X/week     Co-evaluation               AM-PAC PT "6 Clicks" Mobility  Outcome Measure Help needed turning from your back to your side while in a flat bed without using bedrails?: None Help needed moving from lying on your back to sitting on the side of a flat bed without using bedrails?: A Little Help needed moving to and from a bed to a chair (including a wheelchair)?: A Little Help needed standing up from a chair using your arms (e.g., wheelchair or bedside chair)?: A Little Help needed to walk in hospital room?: A Little Help needed climbing 3-5 steps with a railing? : A Little 6 Click Score: 19    End of Session   Activity Tolerance: Treatment limited secondary to medical complications (Comment) (elevated HR) Patient left: in bed;with call bell/phone within reach (on stretcher in ED) Nurse Communication: Mobility status PT Visit Diagnosis: Muscle weakness (generalized) (M62.81);Unsteadiness on feet (R26.81)    Time: 7902-4097 PT Time Calculation (min) (ACUTE ONLY): 15 min   Charges:   PT Evaluation $PT Eval Moderate Complexity: 1 Mod          Reuel Derby, PT, DPT  Acute Rehabilitation Services  Office: [phone]   Rudean Hitt 01/31/2022, 10:38 AM

## 2022-01-31 NOTE — Plan of Care (Signed)
Nutrition Education Note  RD consulted for nutrition education regarding CHF and diabetes. Spoke with pt who reports that she has not followed a low sodium diet before but had recently tried to eat lower sodium foods.  Typically Breakfast is muffins or bagels. She eats sandwiches for lunch and she reports her and her husband have been eating out a lot lately.   Lab Results  Component Value Date   HGBA1C 6.0 (H) 02/14/2021     RD provided "Heart Healthy, Consistent Carbohydrate Nutrition Therapy" handout from the Academy of [employer]etics. Reviewed patient's dietary recall. Provided examples on ways to decrease sodium intake in diet. Discouraged intake of processed foods and use of salt shaker. Encouraged fresh fruits and vegetables as well as whole grain sources of carbohydrates to maximize fiber intake.   RD discussed why it is important for patient to adhere to diet recommendations, and emphasized the role of fluids, foods to avoid, and importance of weighing self daily.   Discussed different food groups and their effects on blood sugar, emphasizing carbohydrate-containing foods. Provided list of carbohydrates and recommended serving sizes of common foods.  Discussed importance of controlled and consistent carbohydrate intake throughout the day. Provided examples of ways to balance meals/snacks and encouraged intake of high-fiber, whole grain complex carbohydrates. Teach back method used.  Expect fair compliance.  Body mass index is 37.96 kg/m. Pt meets criteria for obesity based on current BMI.  Current diet order is Regular, patient is consuming approximately 100% of meals at this time. Labs and medications reviewed. No further nutrition interventions warranted at this time. RD contact information provided. If additional nutrition issues arise, please re-consult RD.   Lockie Pares., RD, LDN, CNSC See AMiON for contact information

## 2022-01-31 NOTE — Progress Notes (Signed)
Echocardiogram 2D Echocardiogram has been performed.  Oneal Deputy Emmaly Leech RDCS 01/31/2022, 1:58 PM

## 2022-01-31 NOTE — ED Notes (Signed)
ED TO INPATIENT HANDOFF REPORT  ED Nurse Name and Phone #: Lysbeth Galas [phone]  S Name/Age/Gender Dorothy Powers 76 y.o. female Room/Bed: 002C/002C  Code Status   Code Status: Full Code  Home/SNF/Other Home Patient oriented to: self, place, time, and situation Is this baseline? Yes   Triage Complete: Triage complete  Chief Complaint Atrial fibrillation with RVR ([place]) [I48.91]  Triage Note Pt here after repeat CXR today showed bilateral pneumonia. Pt reports she was dx w/ R side pneumonia on Monday but has been feeling shob, still having chest pain worse w/ inspiration and weakness. Pt also reports new diagnosis of afib and is scheduled for cardioversion on 7/24. PT denies fevers, N/V/D   Allergies Allergies  Allergen Reactions   Codeine Other (See Comments)    "Couldn't move one night for a short time, after taking the medicaiton"   Lovastatin Other (See Comments)    Leg pain   Sulfa Antibiotics Other (See Comments)    "Felt weak"    Level of Care/Admitting Diagnosis ED Disposition     ED Disposition  Admit   Condition  --   [place]: [place] [[id number]]  Level of Care: Progressive [102]  Admit to Progressive based on following criteria: CARDIOVASCULAR & THORACIC of moderate stability with acute coronary syndrome symptoms/low risk myocardial infarction/hypertensive urgency/arrhythmias/heart failure potentially compromising stability and stable post cardiovascular intervention patients.  May place patient in observation at [facility] or [place] if equivalent level of care is available:: No  Covid Evaluation: Asymptomatic - no recent exposure (last 10 days) testing not required  Diagnosis: Atrial fibrillation with RVR [facility]) [[id number]]  Admitting Physician: [place], [place]  Attending Physician: Toy Baker [[id number]]          B Medical/Surgery History Past Medical History:  Diagnosis Date   Hypertension     Meningioma ([place])    Tachycardia    TIA (transient ischemic attack)    Past Surgical History:  Procedure Laterality Date   ABDOMINAL HYSTERECTOMY     BUNIONECTOMY Right 11/2020     A IV Location/Drains/Wounds Patient Lines/Drains/Airways Status     Active Line/Drains/Airways     Name Placement date Placement time Site Days   Peripheral IV 01/30/22 20 G Anterior;Right Forearm 01/30/22  2107  Forearm  1   Peripheral IV 01/31/22 20 G Anterior;Left Forearm 01/31/22  0135  Forearm  less than 1            Intake/Output Last 24 hours  Intake/Output Summary (Last 24 hours) at 01/31/2022 0855 Last data filed at 01/31/2022 0425 Gross per 24 hour  Intake 250.66 ml  Output 600 ml  Net -349.34 ml    Labs/Imaging Results for orders placed or performed during the hospital encounter of 01/30/22 (from the past 48 hour(s))  Basic metabolic panel     Status: Abnormal   Collection Time: 01/30/22  7:04 PM  Result Value Ref Range   Sodium 135 135 - 145 mmol/L   Potassium 3.5 3.5 - 5.1 mmol/L   Chloride 98 98 - 111 mmol/L   CO2 27 22 - 32 mmol/L   Glucose, Bld 120 (H) 70 - 99 mg/dL    Comment: Glucose reference range applies only to samples taken after fasting for at least 8 hours.   BUN 5 (L) 8 - 23 mg/dL   Creatinine, Ser 0.53 0.44 - 1.00 mg/dL   Calcium 8.9 8.9 - 10.3 mg/dL   GFR, Estimated >60 >60 mL/min  Comment: (NOTE) Calculated using the CKD-EPI Creatinine Equation (2021)    Anion gap 10 5 - 15    Comment: Performed at [facility], [place] [address], [place], [place] [address]  CBC     Status: Abnormal   Collection Time: 01/30/22  7:04 PM  Result Value Ref Range   WBC 9.6 4.0 - 10.5 K/uL   RBC 4.32 3.87 - 5.11 MIL/uL   Hemoglobin 12.2 12.0 - 15.0 g/dL   HCT 37.1 36.0 - 46.0 %   MCV 85.9 80.0 - 100.0 fL   MCH 28.2 26.0 - 34.0 pg   MCHC 32.9 30.0 - 36.0 g/dL   RDW 13.2 11.5 - 15.5 %   Platelets 441 (H) 150 - 400 K/uL   nRBC 0.0 0.0 - 0.2 %    Comment: Performed  at [facility], [place] [address] [place], [place] [address]  Brain natriuretic peptide     Status: Abnormal   Collection Time: 01/30/22  7:16 PM  Result Value Ref Range   B Natriuretic Peptide 183.9 (H) 0.0 - 100.0 pg/mL    Comment: Performed at [facility] [address], [place], [place] [address]  Magnesium     Status: None   Collection Time: 01/30/22  8:45 PM  Result Value Ref Range   Magnesium 2.2 1.7 - 2.4 mg/dL    Comment: Performed at [facility] [address], [place], [place] [address]  CK     Status: None   Collection Time: 01/30/22  9:41 PM  Result Value Ref Range   Total CK 57 38 - 234 U/L    Comment: Performed at [facility], [place] [address], [place], [place] [address]  Hepatic function panel     Status: Abnormal   Collection Time: 01/30/22  9:41 PM  Result Value Ref Range   Total Protein 6.1 (L) 6.5 - 8.1 g/dL   Albumin 2.8 (L) 3.5 - 5.0 g/dL   AST 50 (H) 15 - 41 U/L   ALT 66 (H) 0 - 44 U/L   Alkaline Phosphatase 92 38 - 126 U/L   Total Bilirubin 1.3 (H) 0.3 - 1.2 mg/dL   Bilirubin, Direct 0.3 (H) 0.0 - 0.2 mg/dL   Indirect Bilirubin 1.0 (H) 0.3 - 0.9 mg/dL    Comment: Performed at [facility] [address] [place], [place] [address]  Phosphorus     Status: None   Collection Time: 01/30/22  9:41 PM  Result Value Ref Range   Phosphorus 3.9 2.5 - 4.6 mg/dL    Comment: Performed at [facility] [address] [place], [place] [address]  Troponin I (High Sensitivity)     Status: None   Collection Time: 01/30/22  9:41 PM  Result Value Ref Range   Troponin I (High Sensitivity) 9 <18 ng/L    Comment: (NOTE) Elevated high sensitivity troponin I (hsTnI) values and significant  changes across serial measurements may suggest ACS but many other  chronic and acute conditions are known to elevate hsTnI results.  Refer to the "Links" section for chest pain algorithms and additional  guidance. Performed at [facility], [place] [address] [place],  [address]   Urinalysis, Complete w Microscopic     Status: Abnormal   Collection Time: 01/31/22  1:05 AM  Result Value Ref Range   Color, Urine STRAW (A) YELLOW   APPearance CLEAR CLEAR   Specific Gravity, Urine  1.005 1.005 - 1.030   pH 7.0 5.0 - 8.0   Glucose, UA NEGATIVE NEGATIVE mg/dL   Hgb urine dipstick NEGATIVE NEGATIVE   Bilirubin Urine NEGATIVE NEGATIVE   Ketones, ur NEGATIVE NEGATIVE mg/dL   Protein, ur NEGATIVE NEGATIVE mg/dL   Nitrite NEGATIVE NEGATIVE   Leukocytes,Ua MODERATE (A) NEGATIVE   RBC / HPF 0-5 0 - 5 RBC/hpf   WBC, UA 0-5 0 - 5 WBC/hpf   Bacteria, UA NONE SEEN NONE SEEN   Squamous Epithelial / LPF 0-5 0 - 5    Comment: Performed at [facility], [place] [address] [place], [place] [address]  Procalcitonin - Baseline     Status: None   Collection Time: 01/31/22  1:40 AM  Result Value Ref Range   Procalcitonin <0.10 ng/mL    Comment:        Interpretation: PCT (Procalcitonin) <= 0.5 ng/mL: Systemic infection (sepsis) is not likely. Local bacterial infection is possible. (NOTE)       Sepsis PCT Algorithm           Lower Respiratory Tract                                      Infection PCT Algorithm    ----------------------------     ----------------------------         PCT < 0.25 ng/mL                PCT < 0.10 ng/mL          Strongly encourage             Strongly discourage   discontinuation of antibiotics    initiation of antibiotics    ----------------------------     -----------------------------       PCT 0.25 - 0.50 ng/mL            PCT 0.10 - 0.25 ng/mL               OR       >80% decrease in PCT            Discourage initiation of                                            antibiotics      Encourage discontinuation           of antibiotics    ----------------------------     -----------------------------         PCT >= 0.50 ng/mL              PCT 0.26 - 0.50 ng/mL               AND        <80% decrease in  PCT             Encourage initiation of                                             antibiotics       Encourage continuation           of antibiotics    ----------------------------     -----------------------------  PCT >= 0.50 ng/mL                  PCT > 0.50 ng/mL               AND         increase in PCT                  Strongly encourage                                      initiation of antibiotics    Strongly encourage escalation           of antibiotics                                     -----------------------------                                           PCT <= 0.25 ng/mL                                                 OR                                        > 80% decrease in PCT                                      Discontinue / Do not initiate                                             antibiotics  Performed at [facility], [address], [place], [place] [address]   Troponin I (High Sensitivity)     Status: None   Collection Time: 01/31/22  1:40 AM  Result Value Ref Range   Troponin I (High Sensitivity) 8 <18 ng/L    Comment: (NOTE) Elevated high sensitivity troponin I (hsTnI) values and significant  changes across serial measurements may suggest ACS but many other  chronic and acute conditions are known to elevate hsTnI results.  Refer to the "Links" section for chest pain algorithms and additional  guidance. Performed at [facility], [place] [address], [place], [place] [address]   SARS Coronavirus 2 by RT PCR (hospital order, performed in [facility] hospital lab) *cepheid single result test*     Status: None   Collection Time: 01/31/22  2:52 AM   Specimen: Nasal Swab  Result Value Ref Range   SARS Coronavirus 2 by RT PCR NEGATIVE NEGATIVE    Comment: (NOTE) SARS-CoV-2 target nucleic acids are NOT DETECTED.  The SARS-CoV-2 RNA is generally detectable in upper and lower respiratory specimens during the acute phase of infection. The  lowest concentration of SARS-CoV-2 viral copies this assay can detect is  250 copies / mL. A negative result does not preclude SARS-CoV-2 infection and should not be used as the sole basis for treatment or other patient management decisions.  A negative result may occur with improper specimen collection / handling, submission of specimen other than nasopharyngeal swab, presence of viral mutation(s) within the areas targeted by this assay, and inadequate number of viral copies (<250 copies / mL). A negative result must be combined with clinical observations, patient history, and epidemiological information.  Fact Sheet for Patients:   https://www.patel.info/  Fact Sheet for Healthcare Providers: https://hall.com/  This test is not yet approved or  cleared by the [place] FDA and has been authorized for detection and/or diagnosis of SARS-CoV-2 by FDA under an Emergency Use Authorization (EUA).  This EUA will remain in effect (meaning this test can be used) for the duration of the COVID-19 declaration under Section 564(b)(1) of the Act, 21 U.S.C. section 360bbb-3(b)(1), unless the authorization is terminated or revoked sooner.  Performed at [facility], [place] [address], [place], [place] [address]   Respiratory (~20 pathogens) panel by PCR     Status: None   Collection Time: 01/31/22  2:52 AM   Specimen: Respiratory  Result Value Ref Range   Adenovirus NOT DETECTED NOT DETECTED   Coronavirus 229E NOT DETECTED NOT DETECTED    Comment: (NOTE) The Coronavirus on the Respiratory Panel, DOES NOT test for the novel  Coronavirus (2019 nCoV)    Coronavirus HKU1 NOT DETECTED NOT DETECTED   Coronavirus NL63 NOT DETECTED NOT DETECTED   Coronavirus OC43 NOT DETECTED NOT DETECTED   Metapneumovirus NOT DETECTED NOT DETECTED   Rhinovirus / Enterovirus NOT DETECTED NOT DETECTED   Influenza A NOT DETECTED NOT DETECTED   Influenza B NOT  DETECTED NOT DETECTED   Parainfluenza Virus 1 NOT DETECTED NOT DETECTED   Parainfluenza Virus 2 NOT DETECTED NOT DETECTED   Parainfluenza Virus 3 NOT DETECTED NOT DETECTED   Parainfluenza Virus 4 NOT DETECTED NOT DETECTED   Respiratory Syncytial Virus NOT DETECTED NOT DETECTED   Bordetella pertussis NOT DETECTED NOT DETECTED   Bordetella Parapertussis NOT DETECTED NOT DETECTED   Chlamydophila pneumoniae NOT DETECTED NOT DETECTED   Mycoplasma pneumoniae NOT DETECTED NOT DETECTED    Comment: Performed at [facility], [place] [address], [place], [place] [address]  Prealbumin     Status: Abnormal   Collection Time: 01/31/22  5:48 AM  Result Value Ref Range   Prealbumin 7 (L) 18 - 38 mg/dL    Comment: Performed at [facility] [address], [place], [place] [address]  Comprehensive metabolic panel     Status: Abnormal   Collection Time: 01/31/22  5:48 AM  Result Value Ref Range   Sodium 138 135 - 145 mmol/L   Potassium 3.5 3.5 - 5.1 mmol/L   Chloride 97 (L) 98 - 111 mmol/L   CO2 29 22 - 32 mmol/L   Glucose, Bld 129 (H) 70 - 99 mg/dL    Comment: Glucose reference range applies only to samples taken after fasting for at least 8 hours.   BUN 6 (L) 8 - 23 mg/dL   Creatinine, Ser 0.67 0.44 - 1.00 mg/dL   Calcium 8.8 (L) 8.9 - 10.3 mg/dL   Total Protein 6.2 (L) 6.5 - 8.1 g/dL   Albumin 2.8 (L) 3.5 - 5.0 g/dL   AST 40 15 - 41 U/L   ALT 59 (H) 0 - 44 U/L   Alkaline Phosphatase  92 38 - 126 U/L   Total Bilirubin 1.1 0.3 - 1.2 mg/dL   GFR, Estimated >60 >60 mL/min    Comment: (NOTE) Calculated using the CKD-EPI Creatinine Equation (2021)    Anion gap 12 5 - 15    Comment: Performed at [facility] [address] [place], [place] [address]  CBC     Status: Abnormal   Collection Time: 01/31/22  5:48 AM  Result Value Ref Range   WBC 11.0 (H) 4.0 - 10.5 K/uL   RBC 4.21 3.87 - 5.11 MIL/uL   Hemoglobin 12.0 12.0 - 15.0 g/dL   HCT 36.2 36.0 - 46.0 %   MCV 86.0 80.0 -  100.0 fL   MCH 28.5 26.0 - 34.0 pg   MCHC 33.1 30.0 - 36.0 g/dL   RDW 13.2 11.5 - 15.5 %   Platelets 451 (H) 150 - 400 K/uL   nRBC 0.0 0.0 - 0.2 %    Comment: Performed at [facility], [place] [address] [place], [place] [address]  C-reactive protein     Status: Abnormal   Collection Time: 01/31/22  5:48 AM  Result Value Ref Range   CRP 11.4 (H) <1.0 mg/dL    Comment: Performed at [facility] [address] [place], [place] [address]  Sedimentation rate     Status: Abnormal   Collection Time: 01/31/22  5:48 AM  Result Value Ref Range   Sed Rate 50 (H) 0 - 22 mm/hr    Comment: Performed at [facility] [address], [place],  [address]   DG Chest Port 1 View  Result Date: 01/31/2022 CLINICAL DATA:  Provided history: Shortness of breath, pneumonia. EXAM: PORTABLE CHEST 1 VIEW COMPARISON:  Prior chest radiographs 01/30/2022 and earlier. Chest CT 01/30/2022. FINDINGS: Persistently enlarged cardiopericardial silhouette. A pericardial effusion is noted on yesterday's chest CT. Aortic atherosclerosis. Persistent prominence of the interstitial lung markings, suggesting interstitial edema. Persistent small partially loculated right pleural effusion. Mild atelectasis at the left lung base. No definite airspace consolidation. No evidence of pneumothorax. No acute bony abnormality identified. IMPRESSION: Persistent prominence of the cardiopericardial silhouette, likely reflecting the presence of a pericardial effusion (as was demonstrated on yesterday's chest CT). Persistent prominence of the interstitial lung markings, suggesting interstitial edema. Persistent small partially loculated right pleural effusion. Mild atelectasis at the left lung base. No definite airspace consolidation. Electronically Signed   By: Kellie Simmering D.O.   On: 01/31/2022 08:08   CT Angio Chest PE W and/or Wo Contrast  Result Date: 01/30/2022 CLINICAL DATA:  Concern for pulmonary embolism. EXAM: CT  ANGIOGRAPHY CHEST WITH CONTRAST TECHNIQUE: Multidetector CT imaging of the chest was performed using the standard protocol during bolus administration of intravenous contrast. Multiplanar CT image reconstructions and MIPs were obtained to evaluate the vascular anatomy. RADIATION DOSE REDUCTION: This exam was performed according to the departmental dose-optimization program which includes automated exposure control, adjustment of the mA and/or kV according to patient size and/or use of iterative reconstruction technique. CONTRAST:  78m OMNIPAQUE IOHEXOL 350 MG/ML SOLN COMPARISON:  Head CT dated 01/10/2022. FINDINGS: Evaluation of this exam is limited due to respiratory motion artifact. Cardiovascular: Top-normal cardiac size. Small pericardial effusion measuring 1 cm in thickness. Three vessel coronary vascular calcification. There is mild atherosclerotic calcification of the thoracic aorta. No aneurysmal dilatation. Evaluation of the pulmonary arteries is limited due to respiratory motion artifact. No pulmonary artery embolus identified. Mediastinum/Nodes: Mildly enlarged lymph nodes in  the prevascular space measuring up to 12 mm short axis. No hilar adenopathy. The esophagus is grossly unremarkable. No mediastinal fluid collection. Lungs/Pleura: Small bilateral pleural effusions with fluid noted along the minor fissure. Left lung base subsegmental atelectasis. Pneumonia is not excluded. No pneumothorax. The central airways are patent. Upper Abdomen: No acute abnormality. Musculoskeletal: No acute osseous pathology. Review of the MIP images confirms the above findings. IMPRESSION: 1. No CT evidence of pulmonary artery embolus. 2. Small bilateral pleural effusions with left lung base subsegmental atelectasis. Pneumonia is not excluded. 3. Small pericardial effusion. 4. Aortic Atherosclerosis (ICD10-I70.0). Electronically Signed   By: Anner Crete M.D.   On: 01/30/2022 23:25   DG Chest 2 View  Result Date:  01/30/2022 CLINICAL DATA:  Shortness of breath EXAM: CHEST - 2 VIEW COMPARISON:  Previous studies including the examination done earlier today FINDINGS: Transverse diameter of Elnoria Howard is increased. There is interval decrease in pulmonary vascular congestion. There is interval decrease in pulmonary edema. There are no new focal infiltrates. There is pleural density in the lateral aspect of right mid lung field, possibly suggesting small loculated effusion or pleural thickening. There is blunting of both lateral CP angles suggesting small bilateral effusions. There is no pneumothorax. IMPRESSION: Cardiomegaly. There is interval decrease in pulmonary vascular congestion and pulmonary edema. There are no new focal infiltrates. Small bilateral pleural effusions. There is possible loculated effusion in the lateral aspect of right mid lung field. Electronically Signed   By: Elmer Picker M.D.   On: 01/30/2022 19:55    Pending Labs Unresulted Labs (From admission, onward)     Start     Ordered   02/01/22 0500  Magnesium  Daily at 5am,   R     Question:  Specimen collection method  Answer:  Lab=Lab collect   01/31/22 0704   02/01/22 0500  Comprehensive metabolic panel  Daily at 5am,   R     Question:  Specimen collection method  Answer:  Lab=Lab collect   01/31/22 0704   02/01/22 0500  CBC with Differential/Platelet  Daily at 5am,   R     Question:  Specimen collection method  Answer:  Lab=Lab collect   01/31/22 0704   02/01/22 0500  Brain natriuretic peptide  Daily at 5am,   R     Question:  Specimen collection method  Answer:  Lab=Lab collect   01/31/22 0704   01/31/22 0500  Procalcitonin  Daily at 5am,   R      01/30/22 2201   01/31/22 0022  Expectorated Sputum Assessment w Gram Stain, Rflx to Resp Cult  Once,   R        01/31/22 0021   01/31/22 0022  Legionella Pneumophila Serogp 1 Ur Ag  Once,   R        01/31/22 0021   01/31/22 0022  Strep pneumoniae urinary antigen  Once,   R         01/31/22 0021   01/30/22 2154  TSH  Add-on,   AD        01/30/22 2154            Vitals/Pain Today's Vitals   01/31/22 0430 01/31/22 0500 01/31/22 0700 01/31/22 0823  BP: 104/69 102/66 119/68   Pulse: (!) 103 98 98   Resp: (!) 24 20 (!) 21   Temp:      TempSrc:      SpO2: 93% 93% 92%   PainSc:    0-No  pain    Isolation Precautions Airborne and Contact precautions  Medications Medications  diltiazem (CARDIZEM) 1 mg/mL load via infusion 10 mg (10 mg Intravenous Bolus from Bag 01/30/22 2115)    And  diltiazem (CARDIZEM) 125 mg in dextrose 5% 125 mL (1 mg/mL) infusion (15 mg/hr Intravenous New Bag/Given 01/31/22 0550)  apixaban (ELIQUIS) tablet 5 mg (5 mg Oral Given 01/30/22 2200)  ezetimibe (ZETIA) tablet 10 mg (has no administration in time range)  rosuvastatin (CRESTOR) tablet 10 mg (has no administration in time range)  pantoprazole (PROTONIX) EC tablet 40 mg (has no administration in time range)  acetaminophen (TYLENOL) tablet 650 mg (has no administration in time range)    Or  acetaminophen (TYLENOL) suppository 650 mg (has no administration in time range)  HYDROcodone-acetaminophen (NORCO/VICODIN) 5-325 MG per tablet 1-2 tablet (has no administration in time range)  sodium chloride flush (NS) 0.9 % injection 3 mL (3 mLs Intravenous Not Given 01/30/22 2310)  sodium chloride flush (NS) 0.9 % injection 3 mL (has no administration in time range)  0.9 %  sodium chloride infusion (has no administration in time range)  cefTRIAXone (ROCEPHIN) 2 g in sodium chloride 0.9 % 100 mL IVPB (has no administration in time range)  azithromycin (ZITHROMAX) 500 mg in sodium chloride 0.9 % 250 mL IVPB (0 mg Intravenous Stopped 01/31/22 0425)  diltiazem (CARDIZEM) tablet 90 mg (has no administration in time range)  potassium chloride SA (KLOR-CON M) CR tablet 40 mEq (40 mEq Oral Given 01/30/22 2110)  furosemide (LASIX) injection 40 mg (40 mg Intravenous Given 01/30/22 2108)  iohexol (OMNIPAQUE)  350 MG/ML injection 65 mL (65 mLs Intravenous Contrast Given 01/30/22 2301)  cefTRIAXone (ROCEPHIN) 2 g in sodium chloride 0.9 % 100 mL IVPB (0 g Intravenous Stopped 01/31/22 0233)  furosemide (LASIX) injection 40 mg (40 mg Intravenous Given 01/31/22 0812)  potassium chloride SA (KLOR-CON M) CR tablet 40 mEq (40 mEq Oral Given 01/31/22 7412)    Mobility walks Low fall risk   Focused Assessments Cardiac Assessment Handoff:  Cardiac Rhythm: Atrial fibrillation Lab Results  Component Value Date   CKTOTAL 57 01/30/2022   No results found for: "DDIMER" Does the Patient currently have chest pain? No    R Recommendations: See Admitting Provider Note  Report given to:   Additional Notes:  Pt is on room air and a purwick

## 2022-01-31 NOTE — Assessment & Plan Note (Signed)
Unclear if actually has pneumonia CT chest done could not exclude pneumonia.  Patient have had some cough but no fever no white blood cell count procalcitonin ordered.  For tonight cover with Rocephin and azithromycin Respiratory panel ordered

## 2022-02-01 ENCOUNTER — Encounter (HOSPITAL_COMMUNITY): Payer: Self-pay | Admitting: Internal Medicine

## 2022-02-01 ENCOUNTER — Other Ambulatory Visit: Payer: Self-pay

## 2022-02-01 ENCOUNTER — Encounter (HOSPITAL_COMMUNITY)
Admission: EM | Disposition: A | Discharge status: Home / Self Care | Payer: Self-pay | Source: Home / Self Care | Attending: Internal Medicine

## 2022-02-01 ENCOUNTER — Inpatient Hospital Stay (HOSPITAL_COMMUNITY): Payer: Medicare Other | Admitting: Anesthesiology

## 2022-02-01 DIAGNOSIS — I4891 Unspecified atrial fibrillation: Secondary | ICD-10-CM

## 2022-02-01 DIAGNOSIS — Z8673 Personal history of transient ischemic attack (TIA), and cerebral infarction without residual deficits: Secondary | ICD-10-CM

## 2022-02-01 DIAGNOSIS — I10 Essential (primary) hypertension: Secondary | ICD-10-CM | POA: Diagnosis not present

## 2022-02-01 DIAGNOSIS — Z87891 Personal history of nicotine dependence: Secondary | ICD-10-CM

## 2022-02-01 DIAGNOSIS — I3139 Other pericardial effusion (noninflammatory): Secondary | ICD-10-CM | POA: Diagnosis not present

## 2022-02-01 HISTORY — PX: CARDIOVERSION: SHX1299

## 2022-02-01 LAB — COMPREHENSIVE METABOLIC PANEL
ALT: 48 U/L — ABNORMAL HIGH (ref 0–44)
AST: 31 U/L (ref 15–41)
Albumin: 2.6 g/dL — ABNORMAL LOW (ref 3.5–5.0)
Alkaline Phosphatase: 88 U/L (ref 38–126)
Anion gap: 13 (ref 5–15)
BUN: 16 mg/dL (ref 8–23)
CO2: 27 mmol/L (ref 22–32)
Calcium: 8.7 mg/dL — ABNORMAL LOW (ref 8.9–10.3)
Chloride: 98 mmol/L (ref 98–111)
Creatinine, Ser: 0.8 mg/dL (ref 0.44–1.00)
GFR, Estimated: >60 mL/min (ref >60)
Glucose, Bld: 139 mg/dL — ABNORMAL HIGH (ref 70–99)
Potassium: 3.5 mmol/L (ref 3.5–5.1)
Sodium: 138 mmol/L (ref 135–145)
Total Bilirubin: 0.6 mg/dL (ref 0.3–1.2)
Total Protein: 5.8 g/dL — ABNORMAL LOW (ref 6.5–8.1)

## 2022-02-01 LAB — CBC WITH DIFFERENTIAL/PLATELET
Abs Immature Granulocytes: 0.04 10*3/uL (ref 0.00–0.07)
Basophils Absolute: 0 10*3/uL (ref 0.0–0.1)
Basophils Relative: 0 %
Eosinophils Absolute: 0.1 10*3/uL (ref 0.0–0.5)
Eosinophils Relative: 1 %
HCT: 35.3 % — ABNORMAL LOW (ref 36.0–46.0)
Hemoglobin: 11.5 g/dL — ABNORMAL LOW (ref 12.0–15.0)
Immature Granulocytes: 0 %
Lymphocytes Relative: 11 %
Lymphs Abs: 1 10*3/uL (ref 0.7–4.0)
MCH: 28.2 pg (ref 26.0–34.0)
MCHC: 32.6 g/dL (ref 30.0–36.0)
MCV: 86.5 fL (ref 80.0–100.0)
Monocytes Absolute: 1.4 10*3/uL — ABNORMAL HIGH (ref 0.1–1.0)
Monocytes Relative: 14 %
Neutro Abs: 7.1 10*3/uL (ref 1.7–7.7)
Neutrophils Relative %: 74 %
Platelets: 420 10*3/uL — ABNORMAL HIGH (ref 150–400)
RBC: 4.08 MIL/uL (ref 3.87–5.11)
RDW: 13.4 % (ref 11.5–15.5)
WBC: 9.6 10*3/uL (ref 4.0–10.5)
nRBC: 0 % (ref 0.0–0.2)

## 2022-02-01 LAB — PROCALCITONIN: Procalcitonin: <0.1 ng/mL

## 2022-02-01 LAB — MAGNESIUM: Magnesium: 2 mg/dL (ref 1.7–2.4)

## 2022-02-01 LAB — BRAIN NATRIURETIC PEPTIDE: B Natriuretic Peptide: 70.4 pg/mL (ref 0.0–100.0)

## 2022-02-01 SURGERY — CARDIOVERSION
Anesthesia: General

## 2022-02-01 MED ORDER — LIDOCAINE HCL (CARDIAC) PF 100 MG/5ML IV SOSY
PREFILLED_SYRINGE | INTRAVENOUS | Status: DC | PRN
  Administered 2022-02-01: 50 mg via INTRAVENOUS

## 2022-02-01 MED ORDER — FUROSEMIDE 10 MG/ML IJ SOLN: 40.0000 mg | Freq: Once | INTRAMUSCULAR | Status: DC

## 2022-02-01 MED ORDER — POTASSIUM CHLORIDE CRYS ER 20 MEQ PO TBCR
40.0000 meq | EXTENDED_RELEASE_TABLET | Freq: Once | ORAL | Status: AC
  Administered 2022-02-01: 40 meq via ORAL
  Filled 2022-02-01: qty 2

## 2022-02-01 MED ORDER — AZITHROMYCIN 250 MG PO TABS
500.0000 mg | ORAL_TABLET | Freq: Every day | ORAL | Status: DC
  Administered 2022-02-02: 500 mg via ORAL
  Filled 2022-02-01: qty 2

## 2022-02-01 MED ORDER — PROPOFOL 10 MG/ML IV BOLUS
INTRAVENOUS | Status: DC | PRN
  Administered 2022-02-01: 60 mg via INTRAVENOUS

## 2022-02-01 MED ORDER — FUROSEMIDE 10 MG/ML IJ SOLN
40.0000 mg | Freq: Once | INTRAMUSCULAR | Status: AC
  Administered 2022-02-01: 40 mg via INTRAVENOUS
  Filled 2022-02-01: qty 4

## 2022-02-01 NOTE — Interval H&P Note (Signed)
History and Physical Interval Note:  02/01/2022 10:13 AM  Dorothy Powers  has presented today for surgery, with the diagnosis of AFIB.  The various methods of treatment have been discussed with the patient and family. After consideration of risks, benefits and other options for treatment, the patient has consented to  Procedure(s): CARDIOVERSION (N/A) as a surgical intervention.  The patient's history has been reviewed, patient examined, no change in status, stable for surgery.  I have reviewed the patient's chart and labs.  Questions were answered to the patient's satisfaction.     Skeet Latch, MD

## 2022-02-01 NOTE — Transfer of Care (Signed)
Immediate Anesthesia Transfer of Care Note  Patient: Dorothy Powers  Procedure(s) Performed: CARDIOVERSION  Patient Location: Endoscopy Unit  Anesthesia Type:MAC  Level of Consciousness: awake  Airway & Oxygen Therapy: Patient Spontanous Breathing  Post-op Assessment: Report given to RN and Post -op Vital signs reviewed and stable  Post vital signs: Reviewed and stable  Last Vitals:  Vitals Value Taken Time  BP    Temp    Pulse    Resp    SpO2      Last Pain:  Vitals:   02/01/22 0953  TempSrc: Temporal  PainSc: 0-No pain         Complications: No notable events documented.

## 2022-02-01 NOTE — Progress Notes (Signed)
Progress Note  Patient Name: Dorothy Powers Date of Encounter: 02/01/2022  [facility] Cardiologist: None   Subjective   Brushing her teeth.  Feeling well.  Inpatient Medications    Scheduled Meds:  apixaban  5 mg Oral BID   diltiazem  90 mg Oral Q8H   ezetimibe  10 mg Oral Daily   pantoprazole  40 mg Oral Daily   rosuvastatin  10 mg Oral Daily   sodium chloride flush  3 mL Intravenous Q12H   Continuous Infusions:  sodium chloride     sodium chloride 20 mL/hr at 02/01/22 9147   azithromycin 500 mg (02/01/22 0210)   cefTRIAXone (ROCEPHIN)  IV 2 g (01/31/22 2354)   diltiazem (CARDIZEM) infusion Stopped (02/01/22 0216)   PRN Meds: sodium chloride, acetaminophen **OR** acetaminophen, HYDROcodone-acetaminophen, sodium chloride flush   Vital Signs    Vitals:   02/01/22 0320 02/01/22 0330 02/01/22 0340 02/01/22 0500  BP:   (!) 115/59   Pulse:   98   Resp: '15 19 19   '$ Temp:   97.9 F (36.6 C)   TempSrc:   Oral   SpO2:   96%   Weight:    96.9 kg  Height:        Intake/Output Summary (Last 24 hours) at 02/01/2022 0904 Last data filed at 02/01/2022 0500 Gross per 24 hour  Intake 699.86 ml  Output 600 ml  Net 99.86 ml      02/01/2022    5:00 AM 01/31/2022   10:28 AM 01/22/2022   10:56 AM  Last 3 Weights  Weight (lbs) 213 lb 9.6 oz 214 lb 4.6 oz 216 lb 9.6 oz  Weight (kg) 96.888 kg 97.2 kg 98.249 kg      Telemetry    Atrial fibrillation 120s with movement- Personally Reviewed  ECG    No new- Personally Reviewed  Physical Exam   GEN: No acute distress.   Neck: No JVD Cardiac: Irregular irregular, no murmurs, rubs, or gallops.  Respiratory: Clear to auscultation bilaterally. GI: Soft, nontender, non-distended  MS: No edema; No deformity. Neuro:  Nonfocal  Psych: Normal affect   Labs    High Sensitivity Troponin:   Recent Labs  Lab 01/10/22 1637 01/10/22 1830 01/30/22 2141 01/31/22 0140  TROPONINIHS '9 10 9 8     '$ Chemistry Recent Labs   Lab 01/30/22 1904 01/30/22 2045 01/30/22 2141 01/31/22 0548 02/01/22 0320  NA 135  --   --  138 138  K 3.5  --   --  3.5 3.5  CL 98  --   --  97* 98  CO2 27  --   --  29 27  GLUCOSE 120*  --   --  129* 139*  BUN 5*  --   --  6* 16  CREATININE 0.53  --   --  0.67 0.80  CALCIUM 8.9  --   --  8.8* 8.7*  MG  --  2.2  --   --  2.0  PROT  --   --  6.1* 6.2* 5.8*  ALBUMIN  --   --  2.8* 2.8* 2.6*  AST  --   --  50* 40 31  ALT  --   --  66* 59* 48*  ALKPHOS  --   --  92 92 88  BILITOT  --   --  1.3* 1.1 0.6  GFRNONAA >60  --   --  >60 >60  ANIONGAP 10  --   --  12 13    Lipids No results for input(s): "CHOL", "TRIG", "HDL", "LABVLDL", "LDLCALC", "CHOLHDL" in the last 168 hours.  Hematology Recent Labs  Lab 01/30/22 1904 01/31/22 0548 02/01/22 0320  WBC 9.6 11.0* 9.6  RBC 4.32 4.21 4.08  HGB 12.2 12.0 11.5*  HCT 37.1 36.2 35.3*  MCV 85.9 86.0 86.5  MCH 28.2 28.5 28.2  MCHC 32.9 33.1 32.6  RDW 13.2 13.2 13.4  PLT 441* 451* 420*   Thyroid  Recent Labs  Lab 01/31/22 0548  TSH 1.415    BNP Recent Labs  Lab 01/30/22 1916 02/01/22 0320  BNP 183.9* 70.4    DDimer No results for input(s): "DDIMER" in the last 168 hours.   Radiology    ECHOCARDIOGRAM LIMITED  Result Date: 01/31/2022    ECHOCARDIOGRAM LIMITED REPORT   Patient Name:   Dorothy Powers Date of Exam: 01/31/2022 Medical Rec #:  [id number]          Height:       63.0 in Accession #:    [id number]         Weight:       214.3 lb Date of Birth:  [birth year]          BSA:          1.991 m Patient Age:    76 years           BP:           135/65 mmHg Patient Gender: F                  HR:           83 bpm. Exam Location:  Inpatient Procedure: Limited Echo and Cardiac Doppler Indications:    I31.3 Pericardial effusion  History:        Patient has prior history of Echocardiogram examinations, most                 recent 01/11/2022. Arrythmias:Atrial Fibrillation; Risk                 Factors:Hypertension and Dyslipidemia.   Sonographer:    Raquel Sarna Senior RDCS Referring Phys: [id number] Clois Dupes  Sonographer Comments: Limited to evaluate pericardial effusion IMPRESSIONS  1. Left ventricular ejection fraction, by estimation, is 60 to 65%. The left ventricle has normal function. The left ventricle has no regional wall motion abnormalities.  2. Right ventricular systolic function is normal. The right ventricular size is normal.  3. Small pericardial effusion. There is no RV or RA diastolic collapse. There is respiratory inflow variation across the tricuspid valve but not across the mitral valve. The IVC is dilated and does not collapse with inspiration. Findings are indeterminate for tamponade. However, given the lack of RV collapse or mitral valve flow variation, this likely represents volume overload rather than tamponade. a small pericardial effusion is present. The pericardial effusion is circumferential. There is no evidence of cardiac tamponade.  4. The mitral valve is normal in structure. No evidence of mitral valve regurgitation. No evidence of mitral stenosis.  5. The aortic valve is tricuspid. There is moderate calcification of the aortic valve. There is moderate thickening of the aortic valve. Aortic valve regurgitation is not visualized. No aortic stenosis is present.  6. The inferior vena cava is dilated in size with <50% respiratory variability, suggesting right atrial pressure of 15 mmHg. FINDINGS  Left Ventricle: Left ventricular ejection fraction, by estimation, is 60 to 65%. The left ventricle has normal  function. The left ventricle has no regional wall motion abnormalities. The left ventricular internal cavity size was normal in size. There is  no left ventricular hypertrophy. Right Ventricle: The right ventricular size is normal. No increase in right ventricular wall thickness. Right ventricular systolic function is normal. Left Atrium: Left atrial size was normal in size. Right Atrium: Right atrial size was normal in  size. Pericardium: Small pericardial effusion. There is no RV or RA diastolic collapse. There is respiratory inflow variation across the tricuspid valve but not across the mitral valve. The IVC is dilated and does not collapse with inspiration. Findings are indeterminate for tamponade. However, given the lack of RV collapse or mitral valve flow variation, this likely represents volume overload rather than tamponade. A small pericardial effusion is present. The pericardial effusion is circumferential. There is excessive respiratory variation in the tricuspid valve spectral Doppler velocities. There is no evidence of cardiac tamponade. Mitral Valve: The mitral valve is normal in structure. No evidence of mitral valve stenosis. Tricuspid Valve: The tricuspid valve is normal in structure. Tricuspid valve regurgitation is not demonstrated. No evidence of tricuspid stenosis. Aortic Valve: The aortic valve is tricuspid. There is moderate calcification of the aortic valve. There is moderate thickening of the aortic valve. Aortic valve regurgitation is not visualized. No aortic stenosis is present. Pulmonic Valve: The pulmonic valve was normal in structure. Pulmonic valve regurgitation is not visualized. No evidence of pulmonic stenosis. Aorta: The aortic root is normal in size and structure. Venous: The inferior vena cava is dilated in size with less than 50% respiratory variability, suggesting right atrial pressure of 15 mmHg. IAS/Shunts: No atrial level shunt detected by color flow Doppler. Skeet Latch MD Electronically signed by Skeet Latch MD Signature Date/Time: 01/31/2022/4:37:52 PM    Final    DG Chest Port 1 View  Result Date: 01/31/2022 CLINICAL DATA:  Provided history: Shortness of breath, pneumonia. EXAM: PORTABLE CHEST 1 VIEW COMPARISON:  Prior chest radiographs 01/30/2022 and earlier. Chest CT 01/30/2022. FINDINGS: Persistently enlarged cardiopericardial silhouette. A pericardial effusion is noted  on yesterday's chest CT. Aortic atherosclerosis. Persistent prominence of the interstitial lung markings, suggesting interstitial edema. Persistent small partially loculated right pleural effusion. Mild atelectasis at the left lung base. No definite airspace consolidation. No evidence of pneumothorax. No acute bony abnormality identified. IMPRESSION: Persistent prominence of the cardiopericardial silhouette, likely reflecting the presence of a pericardial effusion (as was demonstrated on yesterday's chest CT). Persistent prominence of the interstitial lung markings, suggesting interstitial edema. Persistent small partially loculated right pleural effusion. Mild atelectasis at the left lung base. No definite airspace consolidation. Electronically Signed   By: Kellie Simmering D.O.   On: 01/31/2022 08:08   CT Angio Chest PE W and/or Wo Contrast  Result Date: 01/30/2022 CLINICAL DATA:  Concern for pulmonary embolism. EXAM: CT ANGIOGRAPHY CHEST WITH CONTRAST TECHNIQUE: Multidetector CT imaging of the chest was performed using the standard protocol during bolus administration of intravenous contrast. Multiplanar CT image reconstructions and MIPs were obtained to evaluate the vascular anatomy. RADIATION DOSE REDUCTION: This exam was performed according to the departmental dose-optimization program which includes automated exposure control, adjustment of the mA and/or kV according to patient size and/or use of iterative reconstruction technique. CONTRAST:  26m OMNIPAQUE IOHEXOL 350 MG/ML SOLN COMPARISON:  Head CT dated 01/10/2022. FINDINGS: Evaluation of this exam is limited due to respiratory motion artifact. Cardiovascular: Top-normal cardiac size. Small pericardial effusion measuring 1 cm in thickness. Three vessel coronary vascular calcification. There is  mild atherosclerotic calcification of the thoracic aorta. No aneurysmal dilatation. Evaluation of the pulmonary arteries is limited due to respiratory motion  artifact. No pulmonary artery embolus identified. Mediastinum/Nodes: Mildly enlarged lymph nodes in the prevascular space measuring up to 12 mm short axis. No hilar adenopathy. The esophagus is grossly unremarkable. No mediastinal fluid collection. Lungs/Pleura: Small bilateral pleural effusions with fluid noted along the minor fissure. Left lung base subsegmental atelectasis. Pneumonia is not excluded. No pneumothorax. The central airways are patent. Upper Abdomen: No acute abnormality. Musculoskeletal: No acute osseous pathology. Review of the MIP images confirms the above findings. IMPRESSION: 1. No CT evidence of pulmonary artery embolus. 2. Small bilateral pleural effusions with left lung base subsegmental atelectasis. Pneumonia is not excluded. 3. Small pericardial effusion. 4. Aortic Atherosclerosis (ICD10-I70.0). Electronically Signed   By: Anner Crete M.D.   On: 01/30/2022 23:25   DG Chest 2 View  Result Date: 01/30/2022 CLINICAL DATA:  Shortness of breath EXAM: CHEST - 2 VIEW COMPARISON:  Previous studies including the examination done earlier today FINDINGS: Transverse diameter of Elnoria Howard is increased. There is interval decrease in pulmonary vascular congestion. There is interval decrease in pulmonary edema. There are no new focal infiltrates. There is pleural density in the lateral aspect of right mid lung field, possibly suggesting small loculated effusion or pleural thickening. There is blunting of both lateral CP angles suggesting small bilateral effusions. There is no pneumothorax. IMPRESSION: Cardiomegaly. There is interval decrease in pulmonary vascular congestion and pulmonary edema. There are no new focal infiltrates. Small bilateral pleural effusions. There is possible loculated effusion in the lateral aspect of right mid lung field. Electronically Signed   By: Elmer Picker M.D.   On: 01/30/2022 19:55    Cardiac Studies   ECHO 01/31/22:   1. Left ventricular ejection fraction,  by estimation, is 60 to 65%. The  left ventricle has normal function. The left ventricle has no regional  wall motion abnormalities.   2. Right ventricular systolic function is normal. The right ventricular  size is normal.   3. Small pericardial effusion. There is no RV or RA diastolic collapse.  There is respiratory inflow variation across the tricuspid valve but not  across the mitral valve. The IVC is dilated and does not collapse with  inspiration. Findings are  indeterminate for tamponade. However, given the lack of RV collapse or  mitral valve flow variation, this likely represents volume overload rather  than tamponade. a small pericardial effusion is present. The pericardial  effusion is circumferential. There  is no evidence of cardiac tamponade.   4. The mitral valve is normal in structure. No evidence of mitral valve  regurgitation. No evidence of mitral stenosis.   5. The aortic valve is tricuspid. There is moderate calcification of the  aortic valve. There is moderate thickening of the aortic valve. Aortic  valve regurgitation is not visualized. No aortic stenosis is present.   6. The inferior vena cava is dilated in size with <50% respiratory  variability, suggesting right atrial pressure of 15 mmHg  Patient Profile     76 y.o. female with persistent AFIB, small pericardial effusion, elevated CRP, possible prior community-acquired pneumonia but may have been related to acute diastolic heart failure in the setting of A-fib with RVR.  Assessment & Plan    AFIB with diastolic heart failure acute  - DCCV today.   - No TEE needed since 3 weeks uninteruppted DOAC  -Hopeful restoration of sinus rhythm will improve overall  cardiac output/hemodynamics.  Continue with gentle Lasix.  Continue with diltiazem.  We will consolidate prior to discharge.  Pericardial effusion  - small.  IVC is dilated on echocardiogram however there is no clinical symptom of pericardial tamponade.  RV  and RA do not collapse.  Hypertension/hyperlipidemia - Continue with current prescription drug management.  Community-acquired pneumonia - Antibiotics per primary team.  Transition to oral with rapid taper off.  May not have been pneumonia in the first place.  Symptoms are more consistent with diastolic heart failure in the setting of A-fib.  However, there is an elevated CRP.   For questions or updates, please contact [place] Please consult [email or web address] for contact info under        Signed, Candee Furbish, MD  02/01/2022, 9:04 AM

## 2022-02-01 NOTE — CV Procedure (Signed)
Electrical Cardioversion Procedure Note Dorothy Powers [id number] 1945-07-20  Procedure: Electrical Cardioversion Indications:  Atrial Fibrillation  Procedure Details Consent: Risks of procedure as well as the alternatives and risks of each were explained to the (patient/caregiver).  Consent for procedure obtained. Time Out: Verified patient identification, verified procedure, site/side was marked, verified correct patient position, special equipment/implants available, medications/allergies/relevent history reviewed, required imaging and test results available.  Performed  Patient placed on cardiac monitor, pulse oximetry, supplemental oxygen as necessary.  Sedation given:  propofol Pacer pads placed anterior and posterior chest.  Cardioverted 1 time(s).  Cardioverted at 150J.  Evaluation Findings: Post procedure EKG shows:  sinus rhythm with PACs Complications: None Patient did tolerate procedure well.   Skeet Latch, MD 02/01/2022, 10:16 AM

## 2022-02-01 NOTE — Anesthesia Postprocedure Evaluation (Signed)
Anesthesia Post Note  Patient: Dorothy Powers  Procedure(s) Performed: CARDIOVERSION     Patient location during evaluation: PACU Anesthesia Type: General Level of consciousness: sedated Pain management: pain level controlled Vital Signs Assessment: post-procedure vital signs reviewed and stable Respiratory status: spontaneous breathing and respiratory function stable Cardiovascular status: stable Postop Assessment: no apparent nausea or vomiting Anesthetic complications: no   No notable events documented.  Last Vitals:  Vitals:   02/01/22 1029 02/01/22 1048  BP: 134/61 135/63  Pulse: 86 91  Resp: 17 18  Temp:  36.6 C  SpO2: 98% 96%    Last Pain:  Vitals:   02/01/22 1048  TempSrc: Oral  PainSc: 0-No pain                 Anjanae Woehrle DANIEL

## 2022-02-01 NOTE — H&P (View-Only) (Signed)
Progress Note  Patient Name: Dorothy Powers Date of Encounter: 02/01/2022  [facility] Cardiologist: None   Subjective   Brushing her teeth.  Feeling well.  Inpatient Medications    Scheduled Meds:  apixaban  5 mg Oral BID   diltiazem  90 mg Oral Q8H   ezetimibe  10 mg Oral Daily   pantoprazole  40 mg Oral Daily   rosuvastatin  10 mg Oral Daily   sodium chloride flush  3 mL Intravenous Q12H   Continuous Infusions:  sodium chloride     sodium chloride 20 mL/hr at 02/01/22 0626   azithromycin 500 mg (02/01/22 0210)   cefTRIAXone (ROCEPHIN)  IV 2 g (01/31/22 2354)   diltiazem (CARDIZEM) infusion Stopped (02/01/22 0216)   PRN Meds: sodium chloride, acetaminophen **OR** acetaminophen, HYDROcodone-acetaminophen, sodium chloride flush   Vital Signs    Vitals:   02/01/22 0320 02/01/22 0330 02/01/22 0340 02/01/22 0500  BP:   (!) 115/59   Pulse:   98   Resp: '15 19 19   '$ Temp:   97.9 F (36.6 C)   TempSrc:   Oral   SpO2:   96%   Weight:    96.9 kg  Height:        Intake/Output Summary (Last 24 hours) at 02/01/2022 0904 Last data filed at 02/01/2022 0500 Gross per 24 hour  Intake 699.86 ml  Output 600 ml  Net 99.86 ml      02/01/2022    5:00 AM 01/31/2022   10:28 AM 01/22/2022   10:56 AM  Last 3 Weights  Weight (lbs) 213 lb 9.6 oz 214 lb 4.6 oz 216 lb 9.6 oz  Weight (kg) 96.888 kg 97.2 kg 98.249 kg      Telemetry    Atrial fibrillation 120s with movement- Personally Reviewed  ECG    No new- Personally Reviewed  Physical Exam   GEN: No acute distress.   Neck: No JVD Cardiac: Irregular irregular, no murmurs, rubs, or gallops.  Respiratory: Clear to auscultation bilaterally. GI: Soft, nontender, non-distended  MS: No edema; No deformity. Neuro:  Nonfocal  Psych: Normal affect   Labs    High Sensitivity Troponin:   Recent Labs  Lab 01/10/22 1637 01/10/22 1830 01/30/22 2141 01/31/22 0140  TROPONINIHS '9 10 9 8     '$ Chemistry Recent Labs   Lab 01/30/22 1904 01/30/22 2045 01/30/22 2141 01/31/22 0548 02/01/22 0320  NA 135  --   --  138 138  K 3.5  --   --  3.5 3.5  CL 98  --   --  97* 98  CO2 27  --   --  29 27  GLUCOSE 120*  --   --  129* 139*  BUN 5*  --   --  6* 16  CREATININE 0.53  --   --  0.67 0.80  CALCIUM 8.9  --   --  8.8* 8.7*  MG  --  2.2  --   --  2.0  PROT  --   --  6.1* 6.2* 5.8*  ALBUMIN  --   --  2.8* 2.8* 2.6*  AST  --   --  50* 40 31  ALT  --   --  66* 59* 48*  ALKPHOS  --   --  92 92 88  BILITOT  --   --  1.3* 1.1 0.6  GFRNONAA >60  --   --  >60 >60  ANIONGAP 10  --   --  12 13    Lipids No results for input(s): "CHOL", "TRIG", "HDL", "LABVLDL", "LDLCALC", "CHOLHDL" in the last 168 hours.  Hematology Recent Labs  Lab 01/30/22 1904 01/31/22 0548 02/01/22 0320  WBC 9.6 11.0* 9.6  RBC 4.32 4.21 4.08  HGB 12.2 12.0 11.5*  HCT 37.1 36.2 35.3*  MCV 85.9 86.0 86.5  MCH 28.2 28.5 28.2  MCHC 32.9 33.1 32.6  RDW 13.2 13.2 13.4  PLT 441* 451* 420*   Thyroid  Recent Labs  Lab 01/31/22 0548  TSH 1.415    BNP Recent Labs  Lab 01/30/22 1916 02/01/22 0320  BNP 183.9* 70.4    DDimer No results for input(s): "DDIMER" in the last 168 hours.   Radiology    ECHOCARDIOGRAM LIMITED  Result Date: 01/31/2022    ECHOCARDIOGRAM LIMITED REPORT   Patient Name:   Dorothy Powers Date of Exam: 01/31/2022 Medical Rec #:  [id number]          Height:       63.0 in Accession #:    [id number]         Weight:       214.3 lb Date of Birth:  12/02/1945          BSA:          1.991 m Patient Age:    76 years           BP:           135/65 mmHg Patient Gender: F                  HR:           83 bpm. Exam Location:  Inpatient Procedure: Limited Echo and Cardiac Doppler Indications:    I31.3 Pericardial effusion  History:        Patient has prior history of Echocardiogram examinations, most                 recent 01/11/2022. Arrythmias:Atrial Fibrillation; Risk                 Factors:Hypertension and Dyslipidemia.   Sonographer:    Raquel Sarna Senior RDCS Referring Phys: [id number] Clois Dupes  Sonographer Comments: Limited to evaluate pericardial effusion IMPRESSIONS  1. Left ventricular ejection fraction, by estimation, is 60 to 65%. The left ventricle has normal function. The left ventricle has no regional wall motion abnormalities.  2. Right ventricular systolic function is normal. The right ventricular size is normal.  3. Small pericardial effusion. There is no RV or RA diastolic collapse. There is respiratory inflow variation across the tricuspid valve but not across the mitral valve. The IVC is dilated and does not collapse with inspiration. Findings are indeterminate for tamponade. However, given the lack of RV collapse or mitral valve flow variation, this likely represents volume overload rather than tamponade. a small pericardial effusion is present. The pericardial effusion is circumferential. There is no evidence of cardiac tamponade.  4. The mitral valve is normal in structure. No evidence of mitral valve regurgitation. No evidence of mitral stenosis.  5. The aortic valve is tricuspid. There is moderate calcification of the aortic valve. There is moderate thickening of the aortic valve. Aortic valve regurgitation is not visualized. No aortic stenosis is present.  6. The inferior vena cava is dilated in size with <50% respiratory variability, suggesting right atrial pressure of 15 mmHg. FINDINGS  Left Ventricle: Left ventricular ejection fraction, by estimation, is 60 to 65%. The left ventricle has normal  function. The left ventricle has no regional wall motion abnormalities. The left ventricular internal cavity size was normal in size. There is  no left ventricular hypertrophy. Right Ventricle: The right ventricular size is normal. No increase in right ventricular wall thickness. Right ventricular systolic function is normal. Left Atrium: Left atrial size was normal in size. Right Atrium: Right atrial size was normal in  size. Pericardium: Small pericardial effusion. There is no RV or RA diastolic collapse. There is respiratory inflow variation across the tricuspid valve but not across the mitral valve. The IVC is dilated and does not collapse with inspiration. Findings are indeterminate for tamponade. However, given the lack of RV collapse or mitral valve flow variation, this likely represents volume overload rather than tamponade. A small pericardial effusion is present. The pericardial effusion is circumferential. There is excessive respiratory variation in the tricuspid valve spectral Doppler velocities. There is no evidence of cardiac tamponade. Mitral Valve: The mitral valve is normal in structure. No evidence of mitral valve stenosis. Tricuspid Valve: The tricuspid valve is normal in structure. Tricuspid valve regurgitation is not demonstrated. No evidence of tricuspid stenosis. Aortic Valve: The aortic valve is tricuspid. There is moderate calcification of the aortic valve. There is moderate thickening of the aortic valve. Aortic valve regurgitation is not visualized. No aortic stenosis is present. Pulmonic Valve: The pulmonic valve was normal in structure. Pulmonic valve regurgitation is not visualized. No evidence of pulmonic stenosis. Aorta: The aortic root is normal in size and structure. Venous: The inferior vena cava is dilated in size with less than 50% respiratory variability, suggesting right atrial pressure of 15 mmHg. IAS/Shunts: No atrial level shunt detected by color flow Doppler. Skeet Latch MD Electronically signed by Skeet Latch MD Signature Date/Time: 01/31/2022/4:37:52 PM    Final    DG Chest Port 1 View  Result Date: 01/31/2022 CLINICAL DATA:  Provided history: Shortness of breath, pneumonia. EXAM: PORTABLE CHEST 1 VIEW COMPARISON:  Prior chest radiographs 01/30/2022 and earlier. Chest CT 01/30/2022. FINDINGS: Persistently enlarged cardiopericardial silhouette. A pericardial effusion is noted  on yesterday's chest CT. Aortic atherosclerosis. Persistent prominence of the interstitial lung markings, suggesting interstitial edema. Persistent small partially loculated right pleural effusion. Mild atelectasis at the left lung base. No definite airspace consolidation. No evidence of pneumothorax. No acute bony abnormality identified. IMPRESSION: Persistent prominence of the cardiopericardial silhouette, likely reflecting the presence of a pericardial effusion (as was demonstrated on yesterday's chest CT). Persistent prominence of the interstitial lung markings, suggesting interstitial edema. Persistent small partially loculated right pleural effusion. Mild atelectasis at the left lung base. No definite airspace consolidation. Electronically Signed   By: Kellie Simmering D.O.   On: 01/31/2022 08:08   CT Angio Chest PE W and/or Wo Contrast  Result Date: 01/30/2022 CLINICAL DATA:  Concern for pulmonary embolism. EXAM: CT ANGIOGRAPHY CHEST WITH CONTRAST TECHNIQUE: Multidetector CT imaging of the chest was performed using the standard protocol during bolus administration of intravenous contrast. Multiplanar CT image reconstructions and MIPs were obtained to evaluate the vascular anatomy. RADIATION DOSE REDUCTION: This exam was performed according to the departmental dose-optimization program which includes automated exposure control, adjustment of the mA and/or kV according to patient size and/or use of iterative reconstruction technique. CONTRAST:  40m OMNIPAQUE IOHEXOL 350 MG/ML SOLN COMPARISON:  Head CT dated 01/10/2022. FINDINGS: Evaluation of this exam is limited due to respiratory motion artifact. Cardiovascular: Top-normal cardiac size. Small pericardial effusion measuring 1 cm in thickness. Three vessel coronary vascular calcification. There is  mild atherosclerotic calcification of the thoracic aorta. No aneurysmal dilatation. Evaluation of the pulmonary arteries is limited due to respiratory motion  artifact. No pulmonary artery embolus identified. Mediastinum/Nodes: Mildly enlarged lymph nodes in the prevascular space measuring up to 12 mm short axis. No hilar adenopathy. The esophagus is grossly unremarkable. No mediastinal fluid collection. Lungs/Pleura: Small bilateral pleural effusions with fluid noted along the minor fissure. Left lung base subsegmental atelectasis. Pneumonia is not excluded. No pneumothorax. The central airways are patent. Upper Abdomen: No acute abnormality. Musculoskeletal: No acute osseous pathology. Review of the MIP images confirms the above findings. IMPRESSION: 1. No CT evidence of pulmonary artery embolus. 2. Small bilateral pleural effusions with left lung base subsegmental atelectasis. Pneumonia is not excluded. 3. Small pericardial effusion. 4. Aortic Atherosclerosis (ICD10-I70.0). Electronically Signed   By: Anner Crete M.D.   On: 01/30/2022 23:25   DG Chest 2 View  Result Date: 01/30/2022 CLINICAL DATA:  Shortness of breath EXAM: CHEST - 2 VIEW COMPARISON:  Previous studies including the examination done earlier today FINDINGS: Transverse diameter of Elnoria Howard is increased. There is interval decrease in pulmonary vascular congestion. There is interval decrease in pulmonary edema. There are no new focal infiltrates. There is pleural density in the lateral aspect of right mid lung field, possibly suggesting small loculated effusion or pleural thickening. There is blunting of both lateral CP angles suggesting small bilateral effusions. There is no pneumothorax. IMPRESSION: Cardiomegaly. There is interval decrease in pulmonary vascular congestion and pulmonary edema. There are no new focal infiltrates. Small bilateral pleural effusions. There is possible loculated effusion in the lateral aspect of right mid lung field. Electronically Signed   By: Elmer Picker M.D.   On: 01/30/2022 19:55    Cardiac Studies   ECHO 01/31/22:   1. Left ventricular ejection fraction,  by estimation, is 60 to 65%. The  left ventricle has normal function. The left ventricle has no regional  wall motion abnormalities.   2. Right ventricular systolic function is normal. The right ventricular  size is normal.   3. Small pericardial effusion. There is no RV or RA diastolic collapse.  There is respiratory inflow variation across the tricuspid valve but not  across the mitral valve. The IVC is dilated and does not collapse with  inspiration. Findings are  indeterminate for tamponade. However, given the lack of RV collapse or  mitral valve flow variation, this likely represents volume overload rather  than tamponade. a small pericardial effusion is present. The pericardial  effusion is circumferential. There  is no evidence of cardiac tamponade.   4. The mitral valve is normal in structure. No evidence of mitral valve  regurgitation. No evidence of mitral stenosis.   5. The aortic valve is tricuspid. There is moderate calcification of the  aortic valve. There is moderate thickening of the aortic valve. Aortic  valve regurgitation is not visualized. No aortic stenosis is present.   6. The inferior vena cava is dilated in size with <50% respiratory  variability, suggesting right atrial pressure of 15 mmHg  Patient Profile     76 y.o. female with persistent AFIB, small pericardial effusion, elevated CRP, possible prior community-acquired pneumonia but may have been related to acute diastolic heart failure in the setting of A-fib with RVR.  Assessment & Plan    AFIB with diastolic heart failure acute  - DCCV today.   - No TEE needed since 3 weeks uninteruppted DOAC  -Hopeful restoration of sinus rhythm will improve overall  cardiac output/hemodynamics.  Continue with gentle Lasix.  Continue with diltiazem.  We will consolidate prior to discharge.  Pericardial effusion  - small.  IVC is dilated on echocardiogram however there is no clinical symptom of pericardial tamponade.  RV  and RA do not collapse.  Hypertension/hyperlipidemia - Continue with current prescription drug management.  Community-acquired pneumonia - Antibiotics per primary team.  Transition to oral with rapid taper off.  May not have been pneumonia in the first place.  Symptoms are more consistent with diastolic heart failure in the setting of A-fib.  However, there is an elevated CRP.   For questions or updates, please contact [place] Please consult [email or web address] for contact info under        Signed, Candee Furbish, MD  02/01/2022, 9:04 AM

## 2022-02-01 NOTE — TOC Progression Note (Signed)
Transition of Care [name]) - Progression Note    Patient Details  Name: Dorothy Powers MRN: [id number] Date of Birth: 05-Feb-1946  Transition of Care [facility]) CM/SW Contact  Zenon Mayo, RN Phone Number: 02/01/2022, 3:39 PM  Clinical Narrative:    from home for Cardioversion today, plan for dc tomorrow. TOC following.        Expected Discharge Plan and Services                                                 Social Determinants of Health (SDOH) Interventions    Readmission Risk Interventions     No data to display

## 2022-02-01 NOTE — Anesthesia Procedure Notes (Signed)
Procedure Name: MAC Date/Time: 02/01/2022 10:02 AM  Performed by: Lieutenant Diego, CRNAPre-anesthesia Checklist: Emergency Drugs available, Patient identified, Patient being monitored, Timeout performed and Suction available Patient Re-evaluated:Patient Re-evaluated prior to induction Oxygen Delivery Method: Ambu bag Preoxygenation: Pre-oxygenation with 100% oxygen Induction Type: IV induction

## 2022-02-01 NOTE — Progress Notes (Signed)
PROGRESS NOTE                                                                                                                                                                                                             Patient Demographics:    Nicha Hemann, is a 76 y.o. female, DOB - 21-Jul-1945, JGO:[id number]  Outpatient Primary MD for the patient is Orpah Melter, MD    LOS - 1  Admit date - 01/30/2022    Chief Complaint  Patient presents with   Shortness of Breath   Atrial Fibrillation       Brief Narrative (HPI from H&P)   76 y.o. female with medical history significant of HTN HLD A-fib history of TIA, meningioma.  Presented with few day history of cough, orthopnea and shortness of breath on exertion, she was diagnosed with pneumonia outpatient placed on antibiotics without any improvement.  In the ER she was diagnosed with CHF along with A-fib RVR and admitted to the hospital.    Subjective:   Patient in bed, appears comfortable, denies any headache, no fever, no chest pain or pressure, no shortness of breath , no abdominal pain. No focal weakness.   Assessment  & Plan :   Paroxysmal atrial fibrillation in RVR [place] vas 2 score of 6.  Currently on IV Cardizem drip along with oral Eliquis, now on oral Cardizem IV Cardizem titrated off, cardioverted to sinus rhythm on 02/01/2022, monitor.  Acute on chronic diastolic CHF with EF 55%.  Likely brought on by RVR, much improved with diuresis continue gentle Lasix repeat IV dose on 02/01/2022 with some potassium supplementation and monitor.  Advance activity.  Questionable history of pneumonia.  Not convincing.  She has no fever, dry cough, symptoms more consistent with CHF, CT chest shows atelectasis, CRP is elevated but this could be due to her underlying malignancy, she has been started on antibiotics upon admission will transition to oral and rapidly taper  off.  Essential hypertension.  Cardizem IV and p.o.  GERD.  PPI.  Paracardial effusion.  Defer to cardiology.  Echo noted.  No signs of tamponade.  Dyslipidemia.  On Crestor and Zetia.  Mild pleural effusion.  Diurese and monitor.  History of meningioma.  Outpatient follow-up.       Condition - Extremely Guarded  Family Communication  :  Husband Juleen China [phone] on 01/31/2022 at 12:01 PM   Code Status :  Full  Consults  :  Cards  PUD Prophylaxis : PPI   Procedures  :     DC cardioversion with change to sinus rhythm on 02/01/2022.     TTE 01/11/22 -    1. Left ventricular ejection fraction, by estimation, is 60 to 65%. The left ventricle has normal function. The left ventricle has no regional wall motion abnormalities. Left ventricular diastolic parameters are indeterminate.   2. Right ventricular systolic function is normal. The right ventricular size is normal. There is normal pulmonary artery systolic pressure.   3. Left atrial size was mildly dilated.   4. The mitral valve is abnormal. No evidence of mitral valve regurgitation. No evidence of mitral stenosis.   5. The aortic valve is normal in structure. There is moderate calcification of the aortic valve. There is moderate thickening of the aortic valve. Aortic valve regurgitation is not visualized. Aortic valve sclerosis/calcification is present, without any   evidence of aortic stenosis.   6. The inferior vena cava is normal in size with greater than 50% respiratory variability, suggesting right atrial pressure of 3 mmHg.  CT - 1. No CT evidence of pulmonary artery embolus. 2. Small bilateral pleural effusions with left lung base subsegmental atelectasis. Pneumonia is not excluded. 3. Small pericardial effusion. 4. Aortic Atherosclerosis       Disposition Plan  :    Status is: Observation  DVT Prophylaxis  :     apixaban (ELIQUIS) tablet 5 mg    Lab Results  Component Value Date   PLT 420 (H)  02/01/2022    Diet :  Diet Order             Diet Heart Room service appropriate? Yes; Fluid consistency: Thin  Diet effective now                    Inpatient Medications  Scheduled Meds:  apixaban  5 mg Oral BID   diltiazem  90 mg Oral Q8H   ezetimibe  10 mg Oral Daily   pantoprazole  40 mg Oral Daily   rosuvastatin  10 mg Oral Daily   sodium chloride flush  3 mL Intravenous Q12H   Continuous Infusions:  sodium chloride     sodium chloride 20 mL/hr at 02/01/22 1002   azithromycin 500 mg (02/01/22 0210)   cefTRIAXone (ROCEPHIN)  IV 2 g (01/31/22 2354)   diltiazem (CARDIZEM) infusion Stopped (02/01/22 0216)   PRN Meds:.sodium chloride, acetaminophen **OR** acetaminophen, HYDROcodone-acetaminophen, sodium chloride flush  Time Spent in minutes  30   Lala Lund M.D on 02/01/2022 at 11:58 AM  To page go to [email or web address]   [facility] -  Office  [phone]  See all Orders from today for further details    Objective:   Vitals:   02/01/22 0953 02/01/22 1019 02/01/22 1029 02/01/22 1048  BP: (!) 152/77 129/64 134/61 135/63  Pulse: (!) 108 86 86 91  Resp: (!) 22 (!) '22 17 18  '$ Temp: (!) 97.5 F (36.4 C) 98.1 F (36.7 C)  97.8 F (36.6 C)  TempSrc: Temporal Temporal  Oral  SpO2: 92% 95% 98% 96%  Weight:      Height:        Wt Readings from Last 3 Encounters:  02/01/22 96.9 kg  01/22/22 98.2 kg  01/10/22 100.2 kg     Intake/Output Summary (Last 24 hours) at 02/01/2022 1158 Last data  filed at 02/01/2022 0500 Gross per 24 hour  Intake 699.86 ml  Output 600 ml  Net 99.86 ml     Physical Exam  Awake Alert, No new F.N deficits, Normal affect [place].AT,PERRAL Supple Neck, No JVD,   Symmetrical Chest wall movement, Good air movement bilaterally, positive Rales RRR,No Gallops,Rubs or new Murmurs,  +ve B.Sounds, Abd Soft, No tenderness,   No Cyanosis, Clubbing or edema       Data Review:    CBC Recent Labs  Lab 01/30/22 1904  01/31/22 0548 02/01/22 0320  WBC 9.6 11.0* 9.6  HGB 12.2 12.0 11.5*  HCT 37.1 36.2 35.3*  PLT 441* 451* 420*  MCV 85.9 86.0 86.5  MCH 28.2 28.5 28.2  MCHC 32.9 33.1 32.6  RDW 13.2 13.2 13.4  LYMPHSABS  --   --  1.0  MONOABS  --   --  1.4*  EOSABS  --   --  0.1  BASOSABS  --   --  0.0    Electrolytes Recent Labs  Lab 01/30/22 1904 01/30/22 1916 01/30/22 2045 01/30/22 2141 01/31/22 0140 01/31/22 0548 02/01/22 0320  NA 135  --   --   --   --  138 138  K 3.5  --   --   --   --  3.5 3.5  CL 98  --   --   --   --  97* 98  CO2 27  --   --   --   --  29 27  GLUCOSE 120*  --   --   --   --  129* 139*  BUN 5*  --   --   --   --  6* 16  CREATININE 0.53  --   --   --   --  0.67 0.80  CALCIUM 8.9  --   --   --   --  8.8* 8.7*  AST  --   --   --  50*  --  40 31  ALT  --   --   --  66*  --  59* 48*  ALKPHOS  --   --   --  92  --  92 88  BILITOT  --   --   --  1.3*  --  1.1 0.6  ALBUMIN  --   --   --  2.8*  --  2.8* 2.6*  MG  --   --  2.2  --   --   --  2.0  CRP  --   --   --   --   --  11.4*  --   PROCALCITON  --   --   --   --  <0.10 <0.10 <0.10  TSH  --   --   --   --   --  1.415  --   BNP  --  183.9*  --   --   --   --  70.4    ------------------------------------------------------------------------------------------------------------------ No results for input(s): "CHOL", "HDL", "LDLCALC", "TRIG", "CHOLHDL", "LDLDIRECT" in the last 72 hours.  Lab Results  Component Value Date   HGBA1C 6.0 (H) 02/14/2021    Recent Labs    01/31/22 0548  TSH 1.415   ------------------------------------------------------------------------------------------------------------------ ID Labs Recent Labs  Lab 01/30/22 1904 01/31/22 0140 01/31/22 0548 02/01/22 0320  WBC 9.6  --  11.0* 9.6  PLT 441*  --  451* 420*  CRP  --   --  11.4*  --  PROCALCITON  --  <0.10 <0.10 <0.10  CREATININE 0.53  --  0.67 0.80   Radiology Reports ECHOCARDIOGRAM LIMITED  Result Date: 01/31/2022     ECHOCARDIOGRAM LIMITED REPORT   Patient Name:   GENOA FREYRE Date of Exam: 01/31/2022 Medical Rec #:  [id number]          Height:       63.0 in Accession #:    [id number]         Weight:       214.3 lb Date of Birth:  1946/07/13          BSA:          1.991 m Patient Age:    45 years           BP:           135/65 mmHg Patient Gender: F                  HR:           83 bpm. Exam Location:  Inpatient Procedure: Limited Echo and Cardiac Doppler Indications:    I31.3 Pericardial effusion  History:        Patient has prior history of Echocardiogram examinations, most                 recent 01/11/2022. Arrythmias:Atrial Fibrillation; Risk                 Factors:Hypertension and Dyslipidemia.  Sonographer:    Raquel Sarna Senior RDCS Referring Phys: [id number] Clois Dupes  Sonographer Comments: Limited to evaluate pericardial effusion IMPRESSIONS  1. Left ventricular ejection fraction, by estimation, is 60 to 65%. The left ventricle has normal function. The left ventricle has no regional wall motion abnormalities.  2. Right ventricular systolic function is normal. The right ventricular size is normal.  3. Small pericardial effusion. There is no RV or RA diastolic collapse. There is respiratory inflow variation across the tricuspid valve but not across the mitral valve. The IVC is dilated and does not collapse with inspiration. Findings are indeterminate for tamponade. However, given the lack of RV collapse or mitral valve flow variation, this likely represents volume overload rather than tamponade. a small pericardial effusion is present. The pericardial effusion is circumferential. There is no evidence of cardiac tamponade.  4. The mitral valve is normal in structure. No evidence of mitral valve regurgitation. No evidence of mitral stenosis.  5. The aortic valve is tricuspid. There is moderate calcification of the aortic valve. There is moderate thickening of the aortic valve. Aortic valve regurgitation is not visualized.  No aortic stenosis is present.  6. The inferior vena cava is dilated in size with <50% respiratory variability, suggesting right atrial pressure of 15 mmHg. FINDINGS  Left Ventricle: Left ventricular ejection fraction, by estimation, is 60 to 65%. The left ventricle has normal function. The left ventricle has no regional wall motion abnormalities. The left ventricular internal cavity size was normal in size. There is  no left ventricular hypertrophy. Right Ventricle: The right ventricular size is normal. No increase in right ventricular wall thickness. Right ventricular systolic function is normal. Left Atrium: Left atrial size was normal in size. Right Atrium: Right atrial size was normal in size. Pericardium: Small pericardial effusion. There is no RV or RA diastolic collapse. There is respiratory inflow variation across the tricuspid valve but not across the mitral valve. The IVC is dilated and does not collapse with inspiration. Findings are indeterminate for  tamponade. However, given the lack of RV collapse or mitral valve flow variation, this likely represents volume overload rather than tamponade. A small pericardial effusion is present. The pericardial effusion is circumferential. There is excessive respiratory variation in the tricuspid valve spectral Doppler velocities. There is no evidence of cardiac tamponade. Mitral Valve: The mitral valve is normal in structure. No evidence of mitral valve stenosis. Tricuspid Valve: The tricuspid valve is normal in structure. Tricuspid valve regurgitation is not demonstrated. No evidence of tricuspid stenosis. Aortic Valve: The aortic valve is tricuspid. There is moderate calcification of the aortic valve. There is moderate thickening of the aortic valve. Aortic valve regurgitation is not visualized. No aortic stenosis is present. Pulmonic Valve: The pulmonic valve was normal in structure. Pulmonic valve regurgitation is not visualized. No evidence of pulmonic  stenosis. Aorta: The aortic root is normal in size and structure. Venous: The inferior vena cava is dilated in size with less than 50% respiratory variability, suggesting right atrial pressure of 15 mmHg. IAS/Shunts: No atrial level shunt detected by color flow Doppler. Skeet Latch MD Electronically signed by Skeet Latch MD Signature Date/Time: 01/31/2022/4:37:52 PM    Final    DG Chest Port 1 View  Result Date: 01/31/2022 CLINICAL DATA:  Provided history: Shortness of breath, pneumonia. EXAM: PORTABLE CHEST 1 VIEW COMPARISON:  Prior chest radiographs 01/30/2022 and earlier. Chest CT 01/30/2022. FINDINGS: Persistently enlarged cardiopericardial silhouette. A pericardial effusion is noted on yesterday's chest CT. Aortic atherosclerosis. Persistent prominence of the interstitial lung markings, suggesting interstitial edema. Persistent small partially loculated right pleural effusion. Mild atelectasis at the left lung base. No definite airspace consolidation. No evidence of pneumothorax. No acute bony abnormality identified. IMPRESSION: Persistent prominence of the cardiopericardial silhouette, likely reflecting the presence of a pericardial effusion (as was demonstrated on yesterday's chest CT). Persistent prominence of the interstitial lung markings, suggesting interstitial edema. Persistent small partially loculated right pleural effusion. Mild atelectasis at the left lung base. No definite airspace consolidation. Electronically Signed   By: Kellie Simmering D.O.   On: 01/31/2022 08:08   CT Angio Chest PE W and/or Wo Contrast  Result Date: 01/30/2022 CLINICAL DATA:  Concern for pulmonary embolism. EXAM: CT ANGIOGRAPHY CHEST WITH CONTRAST TECHNIQUE: Multidetector CT imaging of the chest was performed using the standard protocol during bolus administration of intravenous contrast. Multiplanar CT image reconstructions and MIPs were obtained to evaluate the vascular anatomy. RADIATION DOSE REDUCTION: This  exam was performed according to the departmental dose-optimization program which includes automated exposure control, adjustment of the mA and/or kV according to patient size and/or use of iterative reconstruction technique. CONTRAST:  70m OMNIPAQUE IOHEXOL 350 MG/ML SOLN COMPARISON:  Head CT dated 01/10/2022. FINDINGS: Evaluation of this exam is limited due to respiratory motion artifact. Cardiovascular: Top-normal cardiac size. Small pericardial effusion measuring 1 cm in thickness. Three vessel coronary vascular calcification. There is mild atherosclerotic calcification of the thoracic aorta. No aneurysmal dilatation. Evaluation of the pulmonary arteries is limited due to respiratory motion artifact. No pulmonary artery embolus identified. Mediastinum/Nodes: Mildly enlarged lymph nodes in the prevascular space measuring up to 12 mm short axis. No hilar adenopathy. The esophagus is grossly unremarkable. No mediastinal fluid collection. Lungs/Pleura: Small bilateral pleural effusions with fluid noted along the minor fissure. Left lung base subsegmental atelectasis. Pneumonia is not excluded. No pneumothorax. The central airways are patent. Upper Abdomen: No acute abnormality. Musculoskeletal: No acute osseous pathology. Review of the MIP images confirms the above findings. IMPRESSION: 1. No CT evidence  of pulmonary artery embolus. 2. Small bilateral pleural effusions with left lung base subsegmental atelectasis. Pneumonia is not excluded. 3. Small pericardial effusion. 4. Aortic Atherosclerosis (ICD10-I70.0). Electronically Signed   By: Anner Crete M.D.   On: 01/30/2022 23:25   DG Chest 2 View  Result Date: 01/30/2022 CLINICAL DATA:  Shortness of breath EXAM: CHEST - 2 VIEW COMPARISON:  Previous studies including the examination done earlier today FINDINGS: Transverse diameter of Elnoria Howard is increased. There is interval decrease in pulmonary vascular congestion. There is interval decrease in pulmonary edema.  There are no new focal infiltrates. There is pleural density in the lateral aspect of right mid lung field, possibly suggesting small loculated effusion or pleural thickening. There is blunting of both lateral CP angles suggesting small bilateral effusions. There is no pneumothorax. IMPRESSION: Cardiomegaly. There is interval decrease in pulmonary vascular congestion and pulmonary edema. There are no new focal infiltrates. Small bilateral pleural effusions. There is possible loculated effusion in the lateral aspect of right mid lung field. Electronically Signed   By: Elmer Picker M.D.   On: 01/30/2022 19:55   DG Chest 2 View  Result Date: 01/28/2022 CLINICAL DATA:  COUGH and shortness of breath since 3 weeks EXAM: CHEST - 2 VIEW COMPARISON:  January 10 2022 FINDINGS: The heart size and mediastinal contours are mildly prominent, stable. Thoracic aorta is ectatic. There is some perihilar stranding with faint airspace opacities seen most prominent at the right lower lung zone. No pleural effusion. The visualized skeletal structures are unremarkable. IMPRESSION: Perihilar stranding with faint patchy airspace opacities most prominent at the right lower lung zone. The opacity likely on the basis of pneumonia at the right lower lung zone is new since the previous study. Electronically Signed   By: Frazier Richards M.D.   On: 01/28/2022 16:33

## 2022-02-01 NOTE — Anesthesia Preprocedure Evaluation (Addendum)
Anesthesia Evaluation  Patient identified by MRN, date of birth, ID band Patient awake    Reviewed: Allergy & Precautions, NPO status , Patient's Chart, lab work & pertinent test results  History of Anesthesia Complications Negative for: history of anesthetic complications  Airway Mallampati: II  TM Distance: >3 FB Neck ROM: Full    Dental no notable dental hx. (+) Dental Advisory Given   Pulmonary former smoker,    Pulmonary exam normal        Cardiovascular hypertension, Pt. on medications and Pt. on home beta blockers  Rhythm:Irregular Rate:Tachycardia  IMPRESSIONS   1. Left ventricular ejection fraction, by estimation, is 60 to 65%. The left ventricle has normal function. The left ventricle has no regional wall motion abnormalities. 2. Right ventricular systolic function is normal. The right ventricular size is normal. 3. Small pericardial effusion. There is no RV or RA diastolic collapse. There is respiratory inflow variation across the tricuspid valve but not across the mitral valve. The IVC is dilated and does not collapse with inspiration. Findings are  indeterminate for tamponade. However, given the lack of RV collapse or mitral valve flow variation, this likely represents volume overload rather than tamponade. a small pericardial effusion is present. The pericardial effusion is circumferential. There  is no evidence of cardiac tamponade. 4. The mitral valve is normal in structure. No evidence of mitral valve regurgitation. No evidence of mitral stenosis. 5. The aortic valve is tricuspid. There is moderate calcification of the aortic valve. There is moderate thickening of the aortic valve. Aortic valve regurgitation is not visualized. No aortic stenosis is present. 6. The inferior vena cava is dilated in size with <50% respiratory variability, suggesting right atrial pressure of 15 mmHg.    Neuro/Psych TIA    GI/Hepatic GERD  ,  Endo/Other    Renal/GU      Musculoskeletal   Abdominal   Peds  Hematology   Anesthesia Other Findings   Reproductive/Obstetrics                            Anesthesia Physical Anesthesia Plan  ASA: 3  Anesthesia Plan: General   Post-op Pain Management: Minimal or no pain anticipated   Induction:   PONV Risk Score and Plan: 3 and TIVA  Airway Management Planned: Mask  Additional Equipment:   Intra-op Plan:   Post-operative Plan:   Informed Consent: I have reviewed the patients History and Physical, chart, labs and discussed the procedure including the risks, benefits and alternatives for the proposed anesthesia with the patient or authorized representative who has indicated his/her understanding and acceptance.     Dental advisory given  Plan Discussed with: Anesthesiologist, Surgeon and CRNA  Anesthesia Plan Comments:         Anesthesia Quick Evaluation

## 2022-02-01 NOTE — Interval H&P Note (Signed)
History and Physical Interval Note:  02/01/2022 8:48 AM  Dorothy Powers  has presented today for surgery, with the diagnosis of AFIB.  The various methods of treatment have been discussed with the patient and family. After consideration of risks, benefits and other options for treatment, the patient has consented to  Procedure(s): CARDIOVERSION (N/A) TRANSESOPHAGEAL ECHOCARDIOGRAM (TEE) (N/A) as a surgical intervention.  The patient's history has been reviewed, patient examined, no change in status, stable for surgery.  I have reviewed the patient's chart and labs.  Questions were answered to the patient's satisfaction.     Skeet Latch, MD

## 2022-02-02 DIAGNOSIS — I4819 Other persistent atrial fibrillation: Secondary | ICD-10-CM | POA: Diagnosis not present

## 2022-02-02 DIAGNOSIS — I4891 Unspecified atrial fibrillation: Secondary | ICD-10-CM | POA: Diagnosis not present

## 2022-02-02 DIAGNOSIS — I10 Essential (primary) hypertension: Secondary | ICD-10-CM | POA: Diagnosis not present

## 2022-02-02 LAB — CBC WITH DIFFERENTIAL/PLATELET
Abs Immature Granulocytes: 0.03 10*3/uL (ref 0.00–0.07)
Basophils Absolute: 0 10*3/uL (ref 0.0–0.1)
Basophils Relative: 0 %
Eosinophils Absolute: 0.2 10*3/uL (ref 0.0–0.5)
Eosinophils Relative: 2 %
HCT: 34.7 % — ABNORMAL LOW (ref 36.0–46.0)
Hemoglobin: 11.2 g/dL — ABNORMAL LOW (ref 12.0–15.0)
Immature Granulocytes: 0 %
Lymphocytes Relative: 10 %
Lymphs Abs: 0.9 10*3/uL (ref 0.7–4.0)
MCH: 28.1 pg (ref 26.0–34.0)
MCHC: 32.3 g/dL (ref 30.0–36.0)
MCV: 87 fL (ref 80.0–100.0)
Monocytes Absolute: 1 10*3/uL (ref 0.1–1.0)
Monocytes Relative: 11 %
Neutro Abs: 7.4 10*3/uL (ref 1.7–7.7)
Neutrophils Relative %: 77 %
Platelets: 403 10*3/uL — ABNORMAL HIGH (ref 150–400)
RBC: 3.99 MIL/uL (ref 3.87–5.11)
RDW: 13.5 % (ref 11.5–15.5)
WBC: 9.6 10*3/uL (ref 4.0–10.5)
nRBC: 0 % (ref 0.0–0.2)

## 2022-02-02 LAB — COMPREHENSIVE METABOLIC PANEL
ALT: 51 U/L — ABNORMAL HIGH (ref 0–44)
AST: 39 U/L (ref 15–41)
Albumin: 2.7 g/dL — ABNORMAL LOW (ref 3.5–5.0)
Alkaline Phosphatase: 88 U/L (ref 38–126)
Anion gap: 10 (ref 5–15)
BUN: 13 mg/dL (ref 8–23)
CO2: 32 mmol/L (ref 22–32)
Calcium: 8.4 mg/dL — ABNORMAL LOW (ref 8.9–10.3)
Chloride: 94 mmol/L — ABNORMAL LOW (ref 98–111)
Creatinine, Ser: 0.7 mg/dL (ref 0.44–1.00)
GFR, Estimated: >60 mL/min (ref >60)
Glucose, Bld: 129 mg/dL — ABNORMAL HIGH (ref 70–99)
Potassium: 3.7 mmol/L (ref 3.5–5.1)
Sodium: 136 mmol/L (ref 135–145)
Total Bilirubin: 0.9 mg/dL (ref 0.3–1.2)
Total Protein: 5.9 g/dL — ABNORMAL LOW (ref 6.5–8.1)

## 2022-02-02 LAB — MAGNESIUM: Magnesium: 2 mg/dL (ref 1.7–2.4)

## 2022-02-02 LAB — BRAIN NATRIURETIC PEPTIDE: B Natriuretic Peptide: 77.8 pg/mL (ref 0.0–100.0)

## 2022-02-02 MED ORDER — DILTIAZEM HCL ER COATED BEADS 180 MG PO CP24
300.0000 mg | ORAL_CAPSULE | Freq: Every day | ORAL | Status: DC
  Administered 2022-02-02 – 2022-02-07 (×6): 300 mg via ORAL
  Filled 2022-02-02 (×6): qty 1

## 2022-02-02 MED ORDER — FUROSEMIDE 10 MG/ML IJ SOLN
40.0000 mg | Freq: Once | INTRAMUSCULAR | Status: AC
  Administered 2022-02-02: 40 mg via INTRAVENOUS
  Filled 2022-02-02: qty 4

## 2022-02-02 MED ORDER — DILTIAZEM HCL ER COATED BEADS 180 MG PO CP24: 300.0000 mg | ORAL_CAPSULE | Freq: Every day | ORAL | Status: DC

## 2022-02-02 MED ORDER — POTASSIUM CHLORIDE CRYS ER 20 MEQ PO TBCR
40.0000 meq | EXTENDED_RELEASE_TABLET | Freq: Once | ORAL | Status: AC
  Administered 2022-02-02: 40 meq via ORAL
  Filled 2022-02-02: qty 2

## 2022-02-02 NOTE — Progress Notes (Addendum)
Progress Note  Patient Name: Dorothy Powers Date of Encounter: 02/02/2022  [facility] Cardiologist: Freada Bergeron, MD   Subjective   Denies any palpitation, no significant shortness of breath this morning.  Inpatient Medications    Scheduled Meds:  apixaban  5 mg Oral BID   azithromycin  500 mg Oral Daily   diltiazem  300 mg Oral Daily   ezetimibe  10 mg Oral Daily   pantoprazole  40 mg Oral Daily   rosuvastatin  10 mg Oral Daily   Continuous Infusions:  PRN Meds: acetaminophen **OR** acetaminophen, HYDROcodone-acetaminophen   Vital Signs    Vitals:   02/01/22 1321 02/01/22 2025 02/02/22 0050 02/02/22 0358  BP: (!) 128/54 (!) 142/60 127/60 (!) 124/51  Pulse: 94  92 91  Resp: 18 (!) '24 19 20  '$ Temp:  97.8 F (36.6 C) 98.9 F (37.2 C) 98.1 F (36.7 C)  TempSrc:  Oral Oral Oral  SpO2: 95%  91% 93%  Weight:      Height:        Intake/Output Summary (Last 24 hours) at 02/02/2022 0925 Last data filed at 02/01/2022 1627 Gross per 24 hour  Intake 240 ml  Output 500 ml  Net -260 ml      02/01/2022    5:00 AM 01/31/2022   10:28 AM 01/22/2022   10:56 AM  Last 3 Weights  Weight (lbs) 213 lb 9.6 oz 214 lb 4.6 oz 216 lb 9.6 oz  Weight (kg) 96.888 kg 97.2 kg 98.249 kg      Telemetry    Maintaining sinus rhythm overnight, however reverted back to A-fib with heart rate in the 90s around 6 AM- Personally Reviewed  ECG    Normal sinus rhythm with right bundle branch block.- Personally Reviewed  Physical Exam   GEN: No acute distress.   Neck: No JVD Cardiac: RRR, no murmurs, rubs, or gallops.  Respiratory: left basilar crackles. GI: Soft, nontender, non-distended  MS: No edema; No deformity. Neuro:  Nonfocal  Psych: Normal affect   Labs    High Sensitivity Troponin:   Recent Labs  Lab 01/10/22 1637 01/10/22 1830 01/30/22 2141 01/31/22 0140  TROPONINIHS '9 10 9 8     '$ Chemistry Recent Labs  Lab 01/30/22 2045 01/30/22 2141  01/31/22 0548 02/01/22 0320 02/02/22 0307  NA  --   --  138 138 136  K  --   --  3.5 3.5 3.7  CL  --   --  97* 98 94*  CO2  --   --  29 27 32  GLUCOSE  --   --  129* 139* 129*  BUN  --   --  6* 16 13  CREATININE  --   --  0.67 0.80 0.70  CALCIUM  --   --  8.8* 8.7* 8.4*  MG 2.2  --   --  2.0 2.0  PROT  --    < > 6.2* 5.8* 5.9*  ALBUMIN  --    < > 2.8* 2.6* 2.7*  AST  --    < > 40 31 39  ALT  --    < > 59* 48* 51*  ALKPHOS  --    < > 92 88 88  BILITOT  --    < > 1.1 0.6 0.9  GFRNONAA  --   --  >60 >60 >60  ANIONGAP  --   --  '12 13 10   '$ < > = values in this  interval not displayed.    Lipids No results for input(s): "CHOL", "TRIG", "HDL", "LABVLDL", "LDLCALC", "CHOLHDL" in the last 168 hours.  Hematology Recent Labs  Lab 01/31/22 0548 02/01/22 0320 02/02/22 0307  WBC 11.0* 9.6 9.6  RBC 4.21 4.08 3.99  HGB 12.0 11.5* 11.2*  HCT 36.2 35.3* 34.7*  MCV 86.0 86.5 87.0  MCH 28.5 28.2 28.1  MCHC 33.1 32.6 32.3  RDW 13.2 13.4 13.5  PLT 451* 420* 403*   Thyroid  Recent Labs  Lab 01/31/22 0548  TSH 1.415    BNP Recent Labs  Lab 01/30/22 1916 02/01/22 0320 02/02/22 0307  BNP 183.9* 70.4 77.8    DDimer No results for input(s): "DDIMER" in the last 168 hours.   Radiology    ECHOCARDIOGRAM LIMITED  Result Date: 01/31/2022    ECHOCARDIOGRAM LIMITED REPORT   Patient Name:   Dorothy Powers Date of Exam: 01/31/2022 Medical Rec #:  [id number]          Height:       63.0 in Accession #:    [id number]         Weight:       214.3 lb Date of Birth:  1945/10/04          BSA:          1.991 m Patient Age:    76 years           BP:           135/65 mmHg Patient Gender: F                  HR:           83 bpm. Exam Location:  Inpatient Procedure: Limited Echo and Cardiac Doppler Indications:    I31.3 Pericardial effusion  History:        Patient has prior history of Echocardiogram examinations, most                 recent 01/11/2022. Arrythmias:Atrial Fibrillation; Risk                  Factors:Hypertension and Dyslipidemia.  Sonographer:    Raquel Sarna Senior RDCS Referring Phys: [id number] Clois Dupes  Sonographer Comments: Limited to evaluate pericardial effusion IMPRESSIONS  1. Left ventricular ejection fraction, by estimation, is 60 to 65%. The left ventricle has normal function. The left ventricle has no regional wall motion abnormalities.  2. Right ventricular systolic function is normal. The right ventricular size is normal.  3. Small pericardial effusion. There is no RV or RA diastolic collapse. There is respiratory inflow variation across the tricuspid valve but not across the mitral valve. The IVC is dilated and does not collapse with inspiration. Findings are indeterminate for tamponade. However, given the lack of RV collapse or mitral valve flow variation, this likely represents volume overload rather than tamponade. a small pericardial effusion is present. The pericardial effusion is circumferential. There is no evidence of cardiac tamponade.  4. The mitral valve is normal in structure. No evidence of mitral valve regurgitation. No evidence of mitral stenosis.  5. The aortic valve is tricuspid. There is moderate calcification of the aortic valve. There is moderate thickening of the aortic valve. Aortic valve regurgitation is not visualized. No aortic stenosis is present.  6. The inferior vena cava is dilated in size with <50% respiratory variability, suggesting right atrial pressure of 15 mmHg. FINDINGS  Left Ventricle: Left ventricular ejection fraction, by estimation, is 60 to 65%. The  left ventricle has normal function. The left ventricle has no regional wall motion abnormalities. The left ventricular internal cavity size was normal in size. There is  no left ventricular hypertrophy. Right Ventricle: The right ventricular size is normal. No increase in right ventricular wall thickness. Right ventricular systolic function is normal. Left Atrium: Left atrial size was normal in size. Right  Atrium: Right atrial size was normal in size. Pericardium: Small pericardial effusion. There is no RV or RA diastolic collapse. There is respiratory inflow variation across the tricuspid valve but not across the mitral valve. The IVC is dilated and does not collapse with inspiration. Findings are indeterminate for tamponade. However, given the lack of RV collapse or mitral valve flow variation, this likely represents volume overload rather than tamponade. A small pericardial effusion is present. The pericardial effusion is circumferential. There is excessive respiratory variation in the tricuspid valve spectral Doppler velocities. There is no evidence of cardiac tamponade. Mitral Valve: The mitral valve is normal in structure. No evidence of mitral valve stenosis. Tricuspid Valve: The tricuspid valve is normal in structure. Tricuspid valve regurgitation is not demonstrated. No evidence of tricuspid stenosis. Aortic Valve: The aortic valve is tricuspid. There is moderate calcification of the aortic valve. There is moderate thickening of the aortic valve. Aortic valve regurgitation is not visualized. No aortic stenosis is present. Pulmonic Valve: The pulmonic valve was normal in structure. Pulmonic valve regurgitation is not visualized. No evidence of pulmonic stenosis. Aorta: The aortic root is normal in size and structure. Venous: The inferior vena cava is dilated in size with less than 50% respiratory variability, suggesting right atrial pressure of 15 mmHg. IAS/Shunts: No atrial level shunt detected by color flow Doppler. Skeet Latch MD Electronically signed by Skeet Latch MD Signature Date/Time: 01/31/2022/4:37:52 PM    Final     Cardiac Studies   Limited echo 01/31/2022  Patient Profile     76 y.o. female with PMH of HTN, HLD, history of TIA and recently diagnosed afib in June 2023 with original plan of anticoagulation for 3 weeks and outpatient cardioversion presented to the hospital on  01/30/2022 with worsening shortness of breath and found to be volume overloaded.   Assessment & Plan    Persistent atrial fibrillation  -Patient was recently seen in consultation on 01/11/2022 for newly diagnosed atrial fibrillation after noticing her heart rate stayed in the 90s.  She was placed on Eliquis and atenolol 50 mg twice daily with plan for outpatient cardioversion.  Echo at the time showed normal EF, mildly dilated left atrium.  -Seen by A-fib clinic on 01/22/2022 with initial plan to set up outpatient cardioversion on 7/24.  -Return to the hospital on 7/19 with A-fib with RVR and small pericardial effusion seen on CT. Treated with 40 mg daily of IV Lasix, last dose 7/21.  Started on Cardizem drip for rate control.   -Echo 01/31/2022 EF of 60 to 65%, normal RV, small pericardial effusion, no significant valve issue, normal size left atrium.   -Underwent DCCV on 02/01/2022.  Maintained on short acting diltiazem 90 mg every 8 hours.  -Unfortunately, patient has reverted back to A-fib since 6 AM this morning.  Heart rate is currently 90s to low 100 range.   -BP stable, heart rate borderline controlled.  Will consolidate short acting diltiazem to long-acting Cardizem CD 300 mg daily.  Will discuss with MD regarding possibility of antiarrhythmic therapy with potential plan for repeat cardioversion early next week.  Note, other than fatigue  and shortness of breath, patient does not feel palpitation.  Hypertension: Currently on 90 mg every 8 hours of short acting diltiazem, will consolidate to Cardizem CD 300 mg daily  Hyperlipidemia: On Zetia and Crestor  History of TIA      For questions or updates, please contact [place] Please consult [email or web address] for contact info under        Signed, Almyra Deforest, [place]  02/02/2022, 9:25 AM    Patient seen and examined with Almyra Deforest, PA.  Agree as above, with the following exceptions and changes as noted below.  Patient feels well overall but  disappointed that her cardioversion did not maintain sinus rhythm, no chest pain and very mild palpitations.  No resting shortness of breath, has not ambulated significantly since cardioversion. Gen: NAD, CV: iRRR, no murmurs, Lungs: clear, Abd: soft, Extrem: Warm, well perfused, no edema, Neuro/Psych: alert and oriented x 3, normal mood and affect. All available labs, radiology testing, previous records reviewed.  Several hours of sinus rhythm after cardioversion however reverted back to atrial fibrillation today.  Though she appears relatively euvolemic and is well rate controlled, she is anticipating guests from [place] this week, and feels that if she goes home she will be busy with preparations and will not be able to maintain good rate control nor will she be able to remain symptom free since she primarily has dyspnea on exertion which has been worsening.  To that end we have discussed possible repeat cardioversion with antiarrhythmic loading while in hospital versus EP consultation with antiarrhythmic strategy home-going.  I have offered her EP consultation in hospital and we will pursue this today.    She does have a small pericardial effusion but no signs or symptoms of pericarditis or constrictive physiology.  She does however have an elevated CRP and sedimentation rate, may be from pulmonary process though procalcitonin is negative.  Post cardioversion ECG in sinus rhythm does not show evidence of pericarditis.  Clinically she does not seem to have symptoms of pericarditis.  I suspect she did not maintain sinus rhythm due to underlying inflammatory process.  Her echocardiogram was personally reviewed and demonstrates moderate right atrial enlargement and at least mild left atrial enlargement.  LVEF appears preserved.  Elouise Munroe, MD 02/02/22 10:47 AM

## 2022-02-02 NOTE — Progress Notes (Signed)
PROGRESS NOTE                                                                                                                                                                                                             Patient Demographics:    Dorothy Powers, is a 76 y.o. female, DOB - [birth year], BSJ:[id number]  Outpatient Primary MD for the patient is Orpah Melter, MD    LOS - 2  Admit date - 01/30/2022    Chief Complaint  Patient presents with   Shortness of Breath   Atrial Fibrillation       Brief Narrative (HPI from H&P)   76 y.o. female with medical history significant of HTN HLD A-fib history of TIA, meningioma.  Presented with few day history of cough, orthopnea and shortness of breath on exertion, she was diagnosed with pneumonia outpatient placed on antibiotics without any improvement.  In the ER she was diagnosed with CHF along with A-fib RVR and admitted to the hospital.    Subjective:   Patient in bed, appears comfortable, denies any headache, no fever, no chest pain or pressure, no shortness of breath , no abdominal pain. No new focal weakness does have some generalized weakness.    Assessment  & Plan :   Paroxysmal atrial fibrillation in RVR [place] vas 2 score of 6.  Currently on IV Cardizem drip along with oral Eliquis, now on oral Cardizem IV Cardizem titrated off, cardioverted to sinus rhythm on 02/01/2022, unfortunately she switched back to atrial fibrillation on 02/02/2022 with some generalized weakness, per cardiology EP eval, monitor here.  Continue oral Cardizem for now.  May require additional antiarrhythmics versus repeat cardioversion.  Acute on chronic diastolic CHF with EF 76%.  Likely brought on by RVR, much improved with diuresis continue gentle Lasix repeat IV dose on 02/02/2022 with some potassium supplementation and monitor.  Advance activity.  Questionable history of pneumonia.  Not  convincing.  She has no fever, dry cough, symptoms more consistent with CHF, CT chest shows atelectasis, CRP is elevated but this could be due to her underlying malignancy, she has been started on antibiotics upon admission will transition to oral and rapidly taper off.  Essential hypertension.  Cardizem IV and p.o.  GERD.  PPI.  Paracardial effusion.  Defer to cardiology.  Echo noted.  No signs of  tamponade.  Dyslipidemia.  On Crestor and Zetia.  Mild pleural effusion.  Diurese and monitor.  History of meningioma.  Outpatient follow-up.       Condition - Extremely Guarded  Family Communication  :   Husband Juleen China [phone] on 01/31/2022 at 12:01 PM   Code Status :  Full  Consults  :  Cards  PUD Prophylaxis : PPI   Procedures  :     DC cardioversion with change to sinus rhythm on 02/01/2022.     TTE 01/11/22 -    1. Left ventricular ejection fraction, by estimation, is 60 to 65%. The left ventricle has normal function. The left ventricle has no regional wall motion abnormalities. Left ventricular diastolic parameters are indeterminate.   2. Right ventricular systolic function is normal. The right ventricular size is normal. There is normal pulmonary artery systolic pressure.   3. Left atrial size was mildly dilated.   4. The mitral valve is abnormal. No evidence of mitral valve regurgitation. No evidence of mitral stenosis.   5. The aortic valve is normal in structure. There is moderate calcification of the aortic valve. There is moderate thickening of the aortic valve. Aortic valve regurgitation is not visualized. Aortic valve sclerosis/calcification is present, without any   evidence of aortic stenosis.   6. The inferior vena cava is normal in size with greater than 50% respiratory variability, suggesting right atrial pressure of 3 mmHg.  CT - 1. No CT evidence of pulmonary artery embolus. 2. Small bilateral pleural effusions with left lung base subsegmental  atelectasis. Pneumonia is not excluded. 3. Small pericardial effusion. 4. Aortic Atherosclerosis       Disposition Plan  :    Status is: Observation  DVT Prophylaxis  :     apixaban (ELIQUIS) tablet 5 mg    Lab Results  Component Value Date   PLT 403 (H) 02/02/2022    Diet :  Diet Order             Diet Heart Room service appropriate? Yes; Fluid consistency: Thin  Diet effective now                    Inpatient Medications  Scheduled Meds:  apixaban  5 mg Oral BID   azithromycin  500 mg Oral Daily   diltiazem  300 mg Oral Daily   ezetimibe  10 mg Oral Daily   furosemide  40 mg Intravenous Once   pantoprazole  40 mg Oral Daily   potassium chloride  40 mEq Oral Once   rosuvastatin  10 mg Oral Daily   Continuous Infusions:   PRN Meds:.acetaminophen **OR** acetaminophen, HYDROcodone-acetaminophen  Time Spent in minutes  30   Lala Lund M.D on 02/02/2022 at 10:50 AM  To page go to [email or web address]   [facility] -  Office  [phone]  See all Orders from today for further details    Objective:   Vitals:   02/01/22 1321 02/01/22 2025 02/02/22 0050 02/02/22 0358  BP: (!) 128/54 (!) 142/60 127/60 (!) 124/51  Pulse: 94  92 91  Resp: 18 (!) '24 19 20  '$ Temp:  97.8 F (36.6 C) 98.9 F (37.2 C) 98.1 F (36.7 C)  TempSrc:  Oral Oral Oral  SpO2: 95%  91% 93%  Weight:      Height:        Wt Readings from Last 3 Encounters:  02/01/22 96.9 kg  01/22/22 98.2 kg  01/10/22 100.2 kg  Intake/Output Summary (Last 24 hours) at 02/02/2022 1050 Last data filed at 02/02/2022 1000 Gross per 24 hour  Intake 440 ml  Output 700 ml  Net -260 ml     Physical Exam  Awake Alert, No new F.N deficits, Normal affect [place].AT,PERRAL Supple Neck, No JVD,   Symmetrical Chest wall movement, Good air movement bilaterally, few rales iRRR,No Gallops,Rubs or new Murmurs,  +ve B.Sounds, Abd Soft, No tenderness,   No Cyanosis, Clubbing or edema       Data  Review:    CBC Recent Labs  Lab 01/30/22 1904 01/31/22 0548 02/01/22 0320 02/02/22 0307  WBC 9.6 11.0* 9.6 9.6  HGB 12.2 12.0 11.5* 11.2*  HCT 37.1 36.2 35.3* 34.7*  PLT 441* 451* 420* 403*  MCV 85.9 86.0 86.5 87.0  MCH 28.2 28.5 28.2 28.1  MCHC 32.9 33.1 32.6 32.3  RDW 13.2 13.2 13.4 13.5  LYMPHSABS  --   --  1.0 0.9  MONOABS  --   --  1.4* 1.0  EOSABS  --   --  0.1 0.2  BASOSABS  --   --  0.0 0.0    Electrolytes Recent Labs  Lab 01/30/22 1904 01/30/22 1916 01/30/22 2045 01/30/22 2141 01/31/22 0140 01/31/22 0548 02/01/22 0320 02/02/22 0307  NA 135  --   --   --   --  138 138 136  K 3.5  --   --   --   --  3.5 3.5 3.7  CL 98  --   --   --   --  97* 98 94*  CO2 27  --   --   --   --  29 27 32  GLUCOSE 120*  --   --   --   --  129* 139* 129*  BUN 5*  --   --   --   --  6* 16 13  CREATININE 0.53  --   --   --   --  0.67 0.80 0.70  CALCIUM 8.9  --   --   --   --  8.8* 8.7* 8.4*  AST  --   --   --  50*  --  40 31 39  ALT  --   --   --  66*  --  59* 48* 51*  ALKPHOS  --   --   --  92  --  92 88 88  BILITOT  --   --   --  1.3*  --  1.1 0.6 0.9  ALBUMIN  --   --   --  2.8*  --  2.8* 2.6* 2.7*  MG  --   --  2.2  --   --   --  2.0 2.0  CRP  --   --   --   --   --  11.4*  --   --   PROCALCITON  --   --   --   --  <0.10 <0.10 <0.10  --   TSH  --   --   --   --   --  1.415  --   --   BNP  --  183.9*  --   --   --   --  70.4 77.8    ------------------------------------------------------------------------------------------------------------------ No results for input(s): "CHOL", "HDL", "LDLCALC", "TRIG", "CHOLHDL", "LDLDIRECT" in the last 72 hours.  Lab Results  Component Value Date   HGBA1C 6.0 (H) 02/14/2021    Recent Labs  01/31/22 0548  TSH 1.415   ------------------------------------------------------------------------------------------------------------------ ID Labs Recent Labs  Lab 01/30/22 1904 01/31/22 0140 01/31/22 0548 02/01/22 0320  02/02/22 0307  WBC 9.6  --  11.0* 9.6 9.6  PLT 441*  --  451* 420* 403*  CRP  --   --  11.4*  --   --   PROCALCITON  --  <0.10 <0.10 <0.10  --   CREATININE 0.53  --  0.67 0.80 0.70   Radiology Reports ECHOCARDIOGRAM LIMITED  Result Date: 01/31/2022    ECHOCARDIOGRAM LIMITED REPORT   Patient Name:   ETHER GOEBEL Date of Exam: 01/31/2022 Medical Rec #:  [id number]          Height:       63.0 in Accession #:    [id number]         Weight:       214.3 lb Date of Birth:  06/14/1946          BSA:          1.991 m Patient Age:    31 years           BP:           135/65 mmHg Patient Gender: F                  HR:           83 bpm. Exam Location:  Inpatient Procedure: Limited Echo and Cardiac Doppler Indications:    I31.3 Pericardial effusion  History:        Patient has prior history of Echocardiogram examinations, most                 recent 01/11/2022. Arrythmias:Atrial Fibrillation; Risk                 Factors:Hypertension and Dyslipidemia.  Sonographer:    Raquel Sarna Senior RDCS Referring Phys: [id number] Clois Dupes  Sonographer Comments: Limited to evaluate pericardial effusion IMPRESSIONS  1. Left ventricular ejection fraction, by estimation, is 60 to 65%. The left ventricle has normal function. The left ventricle has no regional wall motion abnormalities.  2. Right ventricular systolic function is normal. The right ventricular size is normal.  3. Small pericardial effusion. There is no RV or RA diastolic collapse. There is respiratory inflow variation across the tricuspid valve but not across the mitral valve. The IVC is dilated and does not collapse with inspiration. Findings are indeterminate for tamponade. However, given the lack of RV collapse or mitral valve flow variation, this likely represents volume overload rather than tamponade. a small pericardial effusion is present. The pericardial effusion is circumferential. There is no evidence of cardiac tamponade.  4. The mitral valve is normal in  structure. No evidence of mitral valve regurgitation. No evidence of mitral stenosis.  5. The aortic valve is tricuspid. There is moderate calcification of the aortic valve. There is moderate thickening of the aortic valve. Aortic valve regurgitation is not visualized. No aortic stenosis is present.  6. The inferior vena cava is dilated in size with <50% respiratory variability, suggesting right atrial pressure of 15 mmHg. FINDINGS  Left Ventricle: Left ventricular ejection fraction, by estimation, is 60 to 65%. The left ventricle has normal function. The left ventricle has no regional wall motion abnormalities. The left ventricular internal cavity size was normal in size. There is  no left ventricular hypertrophy. Right Ventricle: The right ventricular size is normal. No increase in right ventricular wall thickness. Right ventricular systolic  function is normal. Left Atrium: Left atrial size was normal in size. Right Atrium: Right atrial size was normal in size. Pericardium: Small pericardial effusion. There is no RV or RA diastolic collapse. There is respiratory inflow variation across the tricuspid valve but not across the mitral valve. The IVC is dilated and does not collapse with inspiration. Findings are indeterminate for tamponade. However, given the lack of RV collapse or mitral valve flow variation, this likely represents volume overload rather than tamponade. A small pericardial effusion is present. The pericardial effusion is circumferential. There is excessive respiratory variation in the tricuspid valve spectral Doppler velocities. There is no evidence of cardiac tamponade. Mitral Valve: The mitral valve is normal in structure. No evidence of mitral valve stenosis. Tricuspid Valve: The tricuspid valve is normal in structure. Tricuspid valve regurgitation is not demonstrated. No evidence of tricuspid stenosis. Aortic Valve: The aortic valve is tricuspid. There is moderate calcification of the aortic  valve. There is moderate thickening of the aortic valve. Aortic valve regurgitation is not visualized. No aortic stenosis is present. Pulmonic Valve: The pulmonic valve was normal in structure. Pulmonic valve regurgitation is not visualized. No evidence of pulmonic stenosis. Aorta: The aortic root is normal in size and structure. Venous: The inferior vena cava is dilated in size with less than 50% respiratory variability, suggesting right atrial pressure of 15 mmHg. IAS/Shunts: No atrial level shunt detected by color flow Doppler. Skeet Latch MD Electronically signed by Skeet Latch MD Signature Date/Time: 01/31/2022/4:37:52 PM    Final    DG Chest Port 1 View  Result Date: 01/31/2022 CLINICAL DATA:  Provided history: Shortness of breath, pneumonia. EXAM: PORTABLE CHEST 1 VIEW COMPARISON:  Prior chest radiographs 01/30/2022 and earlier. Chest CT 01/30/2022. FINDINGS: Persistently enlarged cardiopericardial silhouette. A pericardial effusion is noted on yesterday's chest CT. Aortic atherosclerosis. Persistent prominence of the interstitial lung markings, suggesting interstitial edema. Persistent small partially loculated right pleural effusion. Mild atelectasis at the left lung base. No definite airspace consolidation. No evidence of pneumothorax. No acute bony abnormality identified. IMPRESSION: Persistent prominence of the cardiopericardial silhouette, likely reflecting the presence of a pericardial effusion (as was demonstrated on yesterday's chest CT). Persistent prominence of the interstitial lung markings, suggesting interstitial edema. Persistent small partially loculated right pleural effusion. Mild atelectasis at the left lung base. No definite airspace consolidation. Electronically Signed   By: Kellie Simmering D.O.   On: 01/31/2022 08:08   CT Angio Chest PE W and/or Wo Contrast  Result Date: 01/30/2022 CLINICAL DATA:  Concern for pulmonary embolism. EXAM: CT ANGIOGRAPHY CHEST WITH CONTRAST  TECHNIQUE: Multidetector CT imaging of the chest was performed using the standard protocol during bolus administration of intravenous contrast. Multiplanar CT image reconstructions and MIPs were obtained to evaluate the vascular anatomy. RADIATION DOSE REDUCTION: This exam was performed according to the departmental dose-optimization program which includes automated exposure control, adjustment of the mA and/or kV according to patient size and/or use of iterative reconstruction technique. CONTRAST:  55m OMNIPAQUE IOHEXOL 350 MG/ML SOLN COMPARISON:  Head CT dated 01/10/2022. FINDINGS: Evaluation of this exam is limited due to respiratory motion artifact. Cardiovascular: Top-normal cardiac size. Small pericardial effusion measuring 1 cm in thickness. Three vessel coronary vascular calcification. There is mild atherosclerotic calcification of the thoracic aorta. No aneurysmal dilatation. Evaluation of the pulmonary arteries is limited due to respiratory motion artifact. No pulmonary artery embolus identified. Mediastinum/Nodes: Mildly enlarged lymph nodes in the prevascular space measuring up to 12 mm short axis. No hilar  adenopathy. The esophagus is grossly unremarkable. No mediastinal fluid collection. Lungs/Pleura: Small bilateral pleural effusions with fluid noted along the minor fissure. Left lung base subsegmental atelectasis. Pneumonia is not excluded. No pneumothorax. The central airways are patent. Upper Abdomen: No acute abnormality. Musculoskeletal: No acute osseous pathology. Review of the MIP images confirms the above findings. IMPRESSION: 1. No CT evidence of pulmonary artery embolus. 2. Small bilateral pleural effusions with left lung base subsegmental atelectasis. Pneumonia is not excluded. 3. Small pericardial effusion. 4. Aortic Atherosclerosis (ICD10-I70.0). Electronically Signed   By: Anner Crete M.D.   On: 01/30/2022 23:25   DG Chest 2 View  Result Date: 01/30/2022 CLINICAL DATA:   Shortness of breath EXAM: CHEST - 2 VIEW COMPARISON:  Previous studies including the examination done earlier today FINDINGS: Transverse diameter of Elnoria Howard is increased. There is interval decrease in pulmonary vascular congestion. There is interval decrease in pulmonary edema. There are no new focal infiltrates. There is pleural density in the lateral aspect of right mid lung field, possibly suggesting small loculated effusion or pleural thickening. There is blunting of both lateral CP angles suggesting small bilateral effusions. There is no pneumothorax. IMPRESSION: Cardiomegaly. There is interval decrease in pulmonary vascular congestion and pulmonary edema. There are no new focal infiltrates. Small bilateral pleural effusions. There is possible loculated effusion in the lateral aspect of right mid lung field. Electronically Signed   By: Elmer Picker M.D.   On: 01/30/2022 19:55

## 2022-02-02 NOTE — Progress Notes (Signed)
Physical Therapy Treatment Patient Details Name: Dorothy Powers MRN: [id number] DOB: [birth year] Today's Date: 02/02/2022   History of Present Illness Pt is a 76 y/o female admitted secondary to a fib with RVR. Thought to have pericardial effusion. PMH includes HTN, TIA, and a fib.    PT Comments    Pt received in recliner, agreeable to therapy session and with good participation and fair tolerance for household distance ambulation task and instruction on therapeutic exercises with gradual progression of mobility according to tolerance and pt instructed on monitoring s/sx distress given Afib, pt with appropriate insight and able to indicate need to rest when fatigue increased. Pt scored 17/24 on Dynamic Gait Index indicating increased fall risk for community dwelling patients. Plan to assess stair negotiation next session if vitals/endurance allow. Pt remains below functional baseline and would benefit from gradually increased activity with therapies/nursing staff/mobility team as indicated.  Recommendations for follow up therapy are one component of a multi-disciplinary discharge planning process, led by the attending physician.  Recommendations may be updated based on patient status, additional functional criteria and insurance authorization.  Follow Up Recommendations  No PT follow up     Assistance Recommended at Discharge Intermittent Supervision/Assistance  Patient can return home with the following Help with stairs or ramp for entrance;Assistance with cooking/housework   Equipment Recommendations  None recommended by PT    Recommendations for Other Services       Precautions / Restrictions Precautions Precautions: Other (comment) Precaution Comments: watch HR Restrictions Weight Bearing Restrictions: No     Mobility  Bed Mobility Overal bed mobility: Needs Assistance             General bed mobility comments: received/remained in chair    Transfers Overall  transfer level: Needs assistance Equipment used: None Transfers: Sit to/from Stand Sit to Stand: Modified independent (Device/Increase time)           General transfer comment: recliner chair, good hand placement, increased time    Ambulation/Gait Ambulation/Gait assistance: Supervision Gait Distance (Feet): 80 Feet Assistive device: None (occasionally grasping hallway rail briefly) Gait Pattern/deviations: Step-through pattern, Decreased stride length Gait velocity: Decreased     General Gait Details: HR to 128 bpm briefly but decreases to 100-115 bpm with standing break, pt has been back in Afib OK to mobilize per RN. DOE 1/4   Stairs Stairs:  (defer due to elevated HR with short household distances, pt c/o 7/10 RPE after gait trial)            Balance Overall balance assessment: Mild deficits observed, not formally tested                               Standardized Balance Assessment Standardized Balance Assessment : Dynamic Gait Index   Dynamic Gait Index Level Surface: Mild Impairment Change in Gait Speed: Mild Impairment Gait with Horizontal Head Turns: Normal Gait with Vertical Head Turns: Normal Gait and Pivot Turn: Mild Impairment Step Over Obstacle: Mild Impairment Step Around Obstacles: Mild Impairment Steps: Moderate Impairment (anticipated based on standing hip flexion/assist needed) Total Score: 17      Cognition Arousal/Alertness: Awake/alert Behavior During Therapy: WFL for tasks assessed/performed Overall Cognitive Status: Within Functional Limits for tasks assessed                                 General Comments: appropriately cautious  with mobility progression        Exercises Other Exercises Other Exercises: seated BLE AROM: hip flexion, LAQ x5 reps ea Other Exercises: STS x 3 reps (reciprocal) using arms from chair Other Exercises: reviewed supine/standing LE HEP (mini squats, hip flexion and supine SLR)  and handout given to reinforce safe progression with sets/reps Other Exercises: gait billed as TE for BLE strengthening    General Comments General comments (skin integrity, edema, etc.): HR 90's bpm resting and up to 128 bpm briefly with hallway ambulation, decreased appropriately with standing break but still in Afib      Pertinent Vitals/Pain Pain Assessment Pain Assessment: No/denies pain     PT Goals (current goals can now be found in the care plan section) Acute Rehab PT Goals Patient Stated Goal: to go home PT Goal Formulation: With patient Time For Goal Achievement: 02/14/22 Progress towards PT goals: Progressing toward goals    Frequency    Min 3X/week      PT Plan Current plan remains appropriate       AM-PAC PT "6 Clicks" Mobility   Outcome Measure  Help needed turning from your back to your side while in a flat bed without using bedrails?: None Help needed moving from lying on your back to sitting on the side of a flat bed without using bedrails?: A Little Help needed moving to and from a bed to a chair (including a wheelchair)?: None Help needed standing up from a chair using your arms (e.g., wheelchair or bedside chair)?: None Help needed to walk in hospital room?: A Little Help needed climbing 3-5 steps with a railing? : A Little 6 Click Score: 21    End of Session   Activity Tolerance: Patient limited by fatigue;Other (comment) (tachy but improves with standing/seated breaks) Patient left: in chair;with call bell/phone within reach Nurse Communication: Mobility status PT Visit Diagnosis: Muscle weakness (generalized) (M62.81);Unsteadiness on feet (R26.81)     Time: 7078-6754 PT Time Calculation (min) (ACUTE ONLY): 15 min  Charges:  $Therapeutic Exercise: 8-22 mins                     Kadeisha Betsch P., PTA Acute Rehabilitation Services Secure Chat Preferred 9a-5:30pm Office: [place] 02/02/2022, 12:16 PM

## 2022-02-02 NOTE — Consult Note (Signed)
Electrophysiology Consultation:   Patient ID: Dorothy Powers MRN: [id number]; DOB: 03-09-1946  Admit date: 01/30/2022 Date of Consult: 02/02/2022  PCP:  Orpah Melter, MD   [facility] Providers Cardiologist:  Freada Bergeron, MD        Patient Profile:   Dorothy Powers is a 76 y.o. female with a hx of HTN, HLD, TIA and AF who is being seen 02/02/2022 for the evaluation of AF at the request of Dr Margaretann Loveless.  History of Present Illness:   Dorothy Powers was recently diagnosed with AF in June 2023. She was started on atenolol and eliquis and planned for outpatient DCCV. She has a normal EF. She returned to the hospital on 01/30/2022 with AF w/ RVR and decompensated diastolic HF. She had a DCCV 02/01/2022 but reverted back to AF shortly after DCCV.  She was very short of breath while in AF. She confirms fluid retention as well. She did not have palpitations while in AF.   Past Medical History:  Diagnosis Date   Hypertension    Meningioma (HCC)    Tachycardia    TIA (transient ischemic attack)     Past Surgical History:  Procedure Laterality Date   ABDOMINAL HYSTERECTOMY     BUNIONECTOMY Right 11/2020       Inpatient Medications: Scheduled Meds:  apixaban  5 mg Oral BID   azithromycin  500 mg Oral Daily   diltiazem  300 mg Oral Daily   ezetimibe  10 mg Oral Daily   furosemide  40 mg Intravenous Once   pantoprazole  40 mg Oral Daily   potassium chloride  40 mEq Oral Once   rosuvastatin  10 mg Oral Daily   Continuous Infusions:  PRN Meds: acetaminophen **OR** acetaminophen, HYDROcodone-acetaminophen  Allergies:    Allergies  Allergen Reactions   Codeine Other (See Comments)    "Couldn't move one night for a short time, after taking the medicaiton"   Lovastatin Other (See Comments)    Leg pain   Sulfa Antibiotics Other (See Comments)    "Felt weak"    Social History:   Social History   Socioeconomic History   Marital status: Married    Spouse  name: Not on file   Number of children: 0   Years of education: college   Highest education level: Bachelor's degree (e.g., BA, AB, BS)  Occupational History   Occupation: Retired  Tobacco Use   Smoking status: Former    Types: Cigarettes   Smokeless tobacco: Never  Substance and Sexual Activity   Alcohol use: Yes    Comment: social   Drug use: Never   Sexual activity: Not on file  Other Topics Concern   Not on file  Social History Narrative   Lives at home with husband.   Right-handed.   No daily use of caffeine.   Social Determinants of Health   Financial Resource Strain: Not on file  Food Insecurity: Not on file  Transportation Needs: Not on file  Physical Activity: Not on file  Stress: Not on file  Social Connections: Not on file  Intimate Partner Violence: Not on file    Family History:    Family History  Problem Relation Age of Onset   Heart disease Mother        pacemaker   Heart attack Father      ROS:  Please see the history of present illness.   All other ROS reviewed and negative.     Physical Exam/Data:  Vitals:   02/01/22 1321 02/01/22 2025 02/02/22 0050 02/02/22 0358  BP: (!) 128/54 (!) 142/60 127/60 (!) 124/51  Pulse: 94  92 91  Resp: 18 (!) '24 19 20  '$ Temp:  97.8 F (36.6 C) 98.9 F (37.2 C) 98.1 F (36.7 C)  TempSrc:  Oral Oral Oral  SpO2: 95%  91% 93%  Weight:      Height:        Intake/Output Summary (Last 24 hours) at 02/02/2022 1054 Last data filed at 02/02/2022 1000 Gross per 24 hour  Intake 440 ml  Output 700 ml  Net -260 ml      02/01/2022    5:00 AM 01/31/2022   10:28 AM 01/22/2022   10:56 AM  Last 3 Weights  Weight (lbs) 213 lb 9.6 oz 214 lb 4.6 oz 216 lb 9.6 oz  Weight (kg) 96.888 kg 97.2 kg 98.249 kg     Body mass index is 37.84 kg/m.   General:  Well nourished, well developed, in no acute distress HEENT: normal Neck: no JVD Vascular: No carotid bruits; Distal pulses 2+ bilaterally Cardiac:  normal S1, S2;  irregularly irregular; no murmur  Lungs:  clear to auscultation bilaterally, no wheezing, rhonchi or rales  Abd: soft, nontender, no hepatomegaly  Ext: no edema Musculoskeletal:  No deformities, BUE and BLE strength normal and equal Skin: warm and dry  Neuro:  CNs 2-12 intact, no focal abnormalities noted Psych:  Normal affect   EKG:  The EKG was personally reviewed and demonstrates:  RBBB, LAFB. Sinus rhythm ECG showed first degree AV delay. Now in AF w/ RVR. QTc corrected for RBBB is 438m.  Telemetry:  Telemetry was personally reviewed and demonstrates:  AF w/ RVR  Relevant CV Studies:  01/31/2022 Echo limited EF60% RV normal Small [place]. No MR   Laboratory Data:  High Sensitivity Troponin:   Recent Labs  Lab 01/10/22 1637 01/10/22 1830 01/30/22 2141 01/31/22 0140  TROPONINIHS '9 10 9 8     '$ Chemistry Recent Labs  Lab 01/30/22 2045 01/31/22 0548 02/01/22 0320 02/02/22 0307  NA  --  138 138 136  K  --  3.5 3.5 3.7  CL  --  97* 98 94*  CO2  --  29 27 32  GLUCOSE  --  129* 139* 129*  BUN  --  6* 16 13  CREATININE  --  0.67 0.80 0.70  CALCIUM  --  8.8* 8.7* 8.4*  MG 2.2  --  2.0 2.0  GFRNONAA  --  >60 >60 >60  ANIONGAP  --  '12 13 10    '$ Recent Labs  Lab 01/31/22 0548 02/01/22 0320 02/02/22 0307  PROT 6.2* 5.8* 5.9*  ALBUMIN 2.8* 2.6* 2.7*  AST 40 31 39  ALT 59* 48* 51*  ALKPHOS 92 88 88  BILITOT 1.1 0.6 0.9   Lipids No results for input(s): "CHOL", "TRIG", "HDL", "LABVLDL", "LDLCALC", "CHOLHDL" in the last 168 hours.  Hematology Recent Labs  Lab 01/31/22 0548 02/01/22 0320 02/02/22 0307  WBC 11.0* 9.6 9.6  RBC 4.21 4.08 3.99  HGB 12.0 11.5* 11.2*  HCT 36.2 35.3* 34.7*  MCV 86.0 86.5 87.0  MCH 28.5 28.2 28.1  MCHC 33.1 32.6 32.3  RDW 13.2 13.4 13.5  PLT 451* 420* 403*   Thyroid  Recent Labs  Lab 01/31/22 0548  TSH 1.415    BNP Recent Labs  Lab 01/30/22 1916 02/01/22 0320 02/02/22 0307  BNP 183.9* 70.4 77.8    DDimer No results  for input(s): "DDIMER" in the last 168 hours.   Radiology/Studies:  ECHOCARDIOGRAM LIMITED  Result Date: 01/31/2022    ECHOCARDIOGRAM LIMITED REPORT   Patient Name:   Dorothy Powers Date of Exam: 01/31/2022 Medical Rec #:  [id number]          Height:       63.0 in Accession #:    [id number]         Weight:       214.3 lb Date of Birth:  03-25-1946          BSA:          1.991 m Patient Age:    84 years           BP:           135/65 mmHg Patient Gender: F                  HR:           83 bpm. Exam Location:  Inpatient Procedure: Limited Echo and Cardiac Doppler Indications:    I31.3 Pericardial effusion  History:        Patient has prior history of Echocardiogram examinations, most                 recent 01/11/2022. Arrythmias:Atrial Fibrillation; Risk                 Factors:Hypertension and Dyslipidemia.  Sonographer:    Raquel Sarna Senior RDCS Referring Phys: [id number] Clois Dupes  Sonographer Comments: Limited to evaluate pericardial effusion IMPRESSIONS  1. Left ventricular ejection fraction, by estimation, is 60 to 65%. The left ventricle has normal function. The left ventricle has no regional wall motion abnormalities.  2. Right ventricular systolic function is normal. The right ventricular size is normal.  3. Small pericardial effusion. There is no RV or RA diastolic collapse. There is respiratory inflow variation across the tricuspid valve but not across the mitral valve. The IVC is dilated and does not collapse with inspiration. Findings are indeterminate for tamponade. However, given the lack of RV collapse or mitral valve flow variation, this likely represents volume overload rather than tamponade. a small pericardial effusion is present. The pericardial effusion is circumferential. There is no evidence of cardiac tamponade.  4. The mitral valve is normal in structure. No evidence of mitral valve regurgitation. No evidence of mitral stenosis.  5. The aortic valve is tricuspid. There is moderate  calcification of the aortic valve. There is moderate thickening of the aortic valve. Aortic valve regurgitation is not visualized. No aortic stenosis is present.  6. The inferior vena cava is dilated in size with <50% respiratory variability, suggesting right atrial pressure of 15 mmHg. FINDINGS  Left Ventricle: Left ventricular ejection fraction, by estimation, is 60 to 65%. The left ventricle has normal function. The left ventricle has no regional wall motion abnormalities. The left ventricular internal cavity size was normal in size. There is  no left ventricular hypertrophy. Right Ventricle: The right ventricular size is normal. No increase in right ventricular wall thickness. Right ventricular systolic function is normal. Left Atrium: Left atrial size was normal in size. Right Atrium: Right atrial size was normal in size. Pericardium: Small pericardial effusion. There is no RV or RA diastolic collapse. There is respiratory inflow variation across the tricuspid valve but not across the mitral valve. The IVC is dilated and does not collapse with inspiration. Findings are indeterminate for tamponade. However, given the lack of RV collapse  or mitral valve flow variation, this likely represents volume overload rather than tamponade. A small pericardial effusion is present. The pericardial effusion is circumferential. There is excessive respiratory variation in the tricuspid valve spectral Doppler velocities. There is no evidence of cardiac tamponade. Mitral Valve: The mitral valve is normal in structure. No evidence of mitral valve stenosis. Tricuspid Valve: The tricuspid valve is normal in structure. Tricuspid valve regurgitation is not demonstrated. No evidence of tricuspid stenosis. Aortic Valve: The aortic valve is tricuspid. There is moderate calcification of the aortic valve. There is moderate thickening of the aortic valve. Aortic valve regurgitation is not visualized. No aortic stenosis is present. Pulmonic  Valve: The pulmonic valve was normal in structure. Pulmonic valve regurgitation is not visualized. No evidence of pulmonic stenosis. Aorta: The aortic root is normal in size and structure. Venous: The inferior vena cava is dilated in size with less than 50% respiratory variability, suggesting right atrial pressure of 15 mmHg. IAS/Shunts: No atrial level shunt detected by color flow Doppler. Skeet Latch MD Electronically signed by Skeet Latch MD Signature Date/Time: 01/31/2022/4:37:52 PM    Final    DG Chest Port 1 View  Result Date: 01/31/2022 CLINICAL DATA:  Provided history: Shortness of breath, pneumonia. EXAM: PORTABLE CHEST 1 VIEW COMPARISON:  Prior chest radiographs 01/30/2022 and earlier. Chest CT 01/30/2022. FINDINGS: Persistently enlarged cardiopericardial silhouette. A pericardial effusion is noted on yesterday's chest CT. Aortic atherosclerosis. Persistent prominence of the interstitial lung markings, suggesting interstitial edema. Persistent small partially loculated right pleural effusion. Mild atelectasis at the left lung base. No definite airspace consolidation. No evidence of pneumothorax. No acute bony abnormality identified. IMPRESSION: Persistent prominence of the cardiopericardial silhouette, likely reflecting the presence of a pericardial effusion (as was demonstrated on yesterday's chest CT). Persistent prominence of the interstitial lung markings, suggesting interstitial edema. Persistent small partially loculated right pleural effusion. Mild atelectasis at the left lung base. No definite airspace consolidation. Electronically Signed   By: Kellie Simmering D.O.   On: 01/31/2022 08:08   CT Angio Chest PE W and/or Wo Contrast  Result Date: 01/30/2022 CLINICAL DATA:  Concern for pulmonary embolism. EXAM: CT ANGIOGRAPHY CHEST WITH CONTRAST TECHNIQUE: Multidetector CT imaging of the chest was performed using the standard protocol during bolus administration of intravenous contrast.  Multiplanar CT image reconstructions and MIPs were obtained to evaluate the vascular anatomy. RADIATION DOSE REDUCTION: This exam was performed according to the departmental dose-optimization program which includes automated exposure control, adjustment of the mA and/or kV according to patient size and/or use of iterative reconstruction technique. CONTRAST:  67m OMNIPAQUE IOHEXOL 350 MG/ML SOLN COMPARISON:  Head CT dated 01/10/2022. FINDINGS: Evaluation of this exam is limited due to respiratory motion artifact. Cardiovascular: Top-normal cardiac size. Small pericardial effusion measuring 1 cm in thickness. Three vessel coronary vascular calcification. There is mild atherosclerotic calcification of the thoracic aorta. No aneurysmal dilatation. Evaluation of the pulmonary arteries is limited due to respiratory motion artifact. No pulmonary artery embolus identified. Mediastinum/Nodes: Mildly enlarged lymph nodes in the prevascular space measuring up to 12 mm short axis. No hilar adenopathy. The esophagus is grossly unremarkable. No mediastinal fluid collection. Lungs/Pleura: Small bilateral pleural effusions with fluid noted along the minor fissure. Left lung base subsegmental atelectasis. Pneumonia is not excluded. No pneumothorax. The central airways are patent. Upper Abdomen: No acute abnormality. Musculoskeletal: No acute osseous pathology. Review of the MIP images confirms the above findings. IMPRESSION: 1. No CT evidence of pulmonary artery embolus. 2. Small bilateral pleural  effusions with left lung base subsegmental atelectasis. Pneumonia is not excluded. 3. Small pericardial effusion. 4. Aortic Atherosclerosis (ICD10-I70.0). Electronically Signed   By: Anner Crete M.D.   On: 01/30/2022 23:25   DG Chest 2 View  Result Date: 01/30/2022 CLINICAL DATA:  Shortness of breath EXAM: CHEST - 2 VIEW COMPARISON:  Previous studies including the examination done earlier today FINDINGS: Transverse diameter of  Elnoria Howard is increased. There is interval decrease in pulmonary vascular congestion. There is interval decrease in pulmonary edema. There are no new focal infiltrates. There is pleural density in the lateral aspect of right mid lung field, possibly suggesting small loculated effusion or pleural thickening. There is blunting of both lateral CP angles suggesting small bilateral effusions. There is no pneumothorax. IMPRESSION: Cardiomegaly. There is interval decrease in pulmonary vascular congestion and pulmonary edema. There are no new focal infiltrates. Small bilateral pleural effusions. There is possible loculated effusion in the lateral aspect of right mid lung field. Electronically Signed   By: Elmer Picker M.D.   On: 01/30/2022 19:55     Assessment and Plan:   #Persistent AF #HFpEF  Rhythm control indicated given AF leading to decompensated HFpEF hospitalizations. She has been cardioverted with rapid return to AF. Complicating management is conduction disease including a RBBB, LAFB and first degree AV delay. She is not a candidate for Class IC agents given this conduction disease. Dofetilide is a consideration with careful monitoring but this would have to be started after azithromycin is out of her system. Amiodarone is not an ideal option given the severity of her baseline conduction system disease.  After discussing the options with the patient, she would like to pursue Dofetilide initiation. Will stop the azithromycin today. I discussed azithro washout with the pharmacy and they recommend waiting 48 hours before starting dofetilide (Monday evening will be dose #1). I have sent a note to the medicine team re: alternative antibiotic to azithro.   For questions or updates, please contact [place] Please consult [email or web address] for contact info under    Signed, Vickie Epley, MD  02/02/2022 10:54 AM

## 2022-02-03 ENCOUNTER — Encounter (HOSPITAL_COMMUNITY): Payer: Self-pay | Admitting: Cardiovascular Disease

## 2022-02-03 DIAGNOSIS — I4819 Other persistent atrial fibrillation: Secondary | ICD-10-CM | POA: Diagnosis not present

## 2022-02-03 DIAGNOSIS — I4891 Unspecified atrial fibrillation: Secondary | ICD-10-CM | POA: Diagnosis not present

## 2022-02-03 LAB — CBC WITH DIFFERENTIAL/PLATELET
Abs Immature Granulocytes: 0.02 10*3/uL (ref 0.00–0.07)
Basophils Absolute: 0.1 10*3/uL (ref 0.0–0.1)
Basophils Relative: 1 %
Eosinophils Absolute: 0.2 10*3/uL (ref 0.0–0.5)
Eosinophils Relative: 2 %
HCT: 36.9 % (ref 36.0–46.0)
Hemoglobin: 12.1 g/dL (ref 12.0–15.0)
Immature Granulocytes: 0 %
Lymphocytes Relative: 15 %
Lymphs Abs: 1.3 10*3/uL (ref 0.7–4.0)
MCH: 27.9 pg (ref 26.0–34.0)
MCHC: 32.8 g/dL (ref 30.0–36.0)
MCV: 85 fL (ref 80.0–100.0)
Monocytes Absolute: 0.9 10*3/uL (ref 0.1–1.0)
Monocytes Relative: 11 %
Neutro Abs: 6 10*3/uL (ref 1.7–7.7)
Neutrophils Relative %: 71 %
Platelets: 401 10*3/uL — ABNORMAL HIGH (ref 150–400)
RBC: 4.34 MIL/uL (ref 3.87–5.11)
RDW: 13.2 % (ref 11.5–15.5)
WBC: 8.5 10*3/uL (ref 4.0–10.5)
nRBC: 0 % (ref 0.0–0.2)

## 2022-02-03 LAB — COMPREHENSIVE METABOLIC PANEL
ALT: 48 U/L — ABNORMAL HIGH (ref 0–44)
AST: 36 U/L (ref 15–41)
Albumin: 2.6 g/dL — ABNORMAL LOW (ref 3.5–5.0)
Alkaline Phosphatase: 94 U/L (ref 38–126)
Anion gap: 9 (ref 5–15)
BUN: 12 mg/dL (ref 8–23)
CO2: 30 mmol/L (ref 22–32)
Calcium: 8.7 mg/dL — ABNORMAL LOW (ref 8.9–10.3)
Chloride: 98 mmol/L (ref 98–111)
Creatinine, Ser: 0.59 mg/dL (ref 0.44–1.00)
GFR, Estimated: >60 mL/min (ref >60)
Glucose, Bld: 123 mg/dL — ABNORMAL HIGH (ref 70–99)
Potassium: 3.5 mmol/L (ref 3.5–5.1)
Sodium: 137 mmol/L (ref 135–145)
Total Bilirubin: 0.8 mg/dL (ref 0.3–1.2)
Total Protein: 5.9 g/dL — ABNORMAL LOW (ref 6.5–8.1)

## 2022-02-03 LAB — MAGNESIUM: Magnesium: 2.1 mg/dL (ref 1.7–2.4)

## 2022-02-03 MED ORDER — POTASSIUM CHLORIDE CRYS ER 20 MEQ PO TBCR
40.0000 meq | EXTENDED_RELEASE_TABLET | Freq: Once | ORAL | Status: AC
  Administered 2022-02-03: 40 meq via ORAL
  Filled 2022-02-03: qty 2

## 2022-02-03 MED ORDER — FUROSEMIDE 40 MG PO TABS
40.0000 mg | ORAL_TABLET | Freq: Every day | ORAL | Status: DC
  Administered 2022-02-03 – 2022-02-07 (×5): 40 mg via ORAL
  Filled 2022-02-03 (×5): qty 1

## 2022-02-03 NOTE — Plan of Care (Signed)
  Problem: Education: Goal: Knowledge of General Education information will improve Description: Including pain rating scale, medication(s)/side effects and non-pharmacologic comfort measures Outcome: Progressing   Problem: Health Behavior/Discharge Planning: Goal: Ability to manage health-related needs will improve Outcome: Progressing   Problem: Activity: Goal: Risk for activity intolerance will decrease Outcome: Progressing   Problem: Nutrition: Goal: Adequate nutrition will be maintained Outcome: Progressing   Problem: Coping: Goal: Level of anxiety will decrease Outcome: Progressing   Problem: Elimination: Goal: Will not experience complications related to bowel motility Outcome: Progressing Goal: Will not experience complications related to urinary retention Outcome: Progressing   Problem: Pain Managment: Goal: General experience of comfort will improve Outcome: Progressing   Problem: Skin Integrity: Goal: Risk for impaired skin integrity will decrease Outcome: Progressing   Problem: Safety: Goal: Ability to remain free from injury will improve Outcome: Progressing

## 2022-02-03 NOTE — Progress Notes (Signed)
Mobility Specialist Progress Note    02/03/22 1555  Mobility  Activity Ambulated with assistance in hallway  Level of Assistance Contact guard assist, steadying assist  Assistive Device Other (Comment) (hallway railiings)  Distance Ambulated (ft) 155 ft  Activity Response Tolerated well  $Mobility charge 1 Mobility   Pre-Mobility: 106 HR During Mobility: 127 HR Post-Mobility: 104 HR  Pt received in chair and agreeable. C/o legs getting weak upon return. Left in chair with call bell in reach.    Hildred Alamin Mobility Specialist

## 2022-02-03 NOTE — Progress Notes (Signed)
Progress Note  Patient Name: Dorothy Powers Date of Encounter: 02/03/2022  [facility] Cardiologist: Freada Bergeron, MD   Subjective   NAEO.    Inpatient Medications    Scheduled Meds:  apixaban  5 mg Oral BID   diltiazem  300 mg Oral Daily   ezetimibe  10 mg Oral Daily   pantoprazole  40 mg Oral Daily   rosuvastatin  10 mg Oral Daily   Continuous Infusions:  PRN Meds: acetaminophen **OR** acetaminophen, HYDROcodone-acetaminophen   Vital Signs    Vitals:   02/02/22 2220 02/02/22 2225 02/02/22 2230 02/03/22 0500  BP:    (!) 148/61  Pulse:      Resp: '18 18 18 20  '$ Temp:    97.7 F (36.5 C)  TempSrc:    Oral  SpO2:    98%  Weight:    95.3 kg  Height:        Intake/Output Summary (Last 24 hours) at 02/03/2022 0912 Last data filed at 02/03/2022 0500 Gross per 24 hour  Intake 200 ml  Output 1800 ml  Net -1600 ml      02/03/2022    5:00 AM 02/01/2022    5:00 AM 01/31/2022   10:28 AM  Last 3 Weights  Weight (lbs) 210 lb 213 lb 9.6 oz 214 lb 4.6 oz  Weight (kg) 95.255 kg 96.888 kg 97.2 kg      Telemetry    AF - Personally Reviewed  ECG    Personally Reviewed  Physical Exam   GEN: No acute distress.   Neck: No JVD Cardiac: irregularly irregular, no murmurs, rubs, or gallops.  Respiratory: Clear to auscultation bilaterally. GI: Soft, nontender, non-distended  MS: No edema; No deformity. Neuro:  Nonfocal  Psych: Normal affect   Labs    High Sensitivity Troponin:   Recent Labs  Lab 01/10/22 1637 01/10/22 1830 01/30/22 2141 01/31/22 0140  TROPONINIHS '9 10 9 8     '$ Chemistry Recent Labs  Lab 02/01/22 0320 02/02/22 0307 02/03/22 0446  NA 138 136 137  K 3.5 3.7 3.5  CL 98 94* 98  CO2 27 32 30  GLUCOSE 139* 129* 123*  BUN '16 13 12  '$ CREATININE 0.80 0.70 0.59  CALCIUM 8.7* 8.4* 8.7*  MG 2.0 2.0 2.1  PROT 5.8* 5.9* 5.9*  ALBUMIN 2.6* 2.7* 2.6*  AST 31 39 36  ALT 48* 51* 48*  ALKPHOS 88 88 94  BILITOT 0.6 0.9 0.8   GFRNONAA >60 >60 >60  ANIONGAP '13 10 9    '$ Lipids No results for input(s): "CHOL", "TRIG", "HDL", "LABVLDL", "LDLCALC", "CHOLHDL" in the last 168 hours.  Hematology Recent Labs  Lab 02/01/22 0320 02/02/22 0307 02/03/22 0446  WBC 9.6 9.6 8.5  RBC 4.08 3.99 4.34  HGB 11.5* 11.2* 12.1  HCT 35.3* 34.7* 36.9  MCV 86.5 87.0 85.0  MCH 28.2 28.1 27.9  MCHC 32.6 32.3 32.8  RDW 13.4 13.5 13.2  PLT 420* 403* 401*   Thyroid  Recent Labs  Lab 01/31/22 0548  TSH 1.415    BNP Recent Labs  Lab 01/30/22 1916 02/01/22 0320 02/02/22 0307  BNP 183.9* 70.4 77.8    DDimer No results for input(s): "DDIMER" in the last 168 hours.   Radiology    No results found.    Assessment & Plan    Dorothy Powers is a 76 y.o. female with a hx of HTN, HLD, TIA and AF who was seen 02/02/2022 for the evaluation of AF  at the request of Dr Margaretann Loveless.  #Persistent AF Symptomatic. Rhythm control indicated given link to her decompensated HF hospitalizations. Planning for dofetilide given baseline conduction disease and HF. Azithro stopped 02/02/2022. Plan to start dofetilide 02/04/2022 PM. Pharmacy consulted Continue apixaban  #HFpEF Start lasix PO '40mg'$  daily today   For questions or updates, please contact [place] Please consult [email or web address] for contact info under        Signed, Vickie Epley, MD  02/03/2022, 9:12 AM

## 2022-02-03 NOTE — Progress Notes (Signed)
PROGRESS NOTE                                                                                                                                                                                                             Patient Demographics:    Dorothy Powers, is a 76 y.o. female, DOB - 1945-08-24, KWI:[id number]  Outpatient Primary MD for the patient is Orpah Melter, MD    LOS - 3  Admit date - 01/30/2022    Chief Complaint  Patient presents with   Shortness of Breath   Atrial Fibrillation       Brief Narrative (HPI from H&P)   76 y.o. female with medical history significant of HTN HLD A-fib history of TIA, meningioma.  Presented with few day history of cough, orthopnea and shortness of breath on exertion, she was diagnosed with pneumonia outpatient placed on antibiotics without any improvement.  In the ER she was diagnosed with CHF along with A-fib RVR and admitted to the hospital.    Subjective:   Patient in bed, appears comfortable, denies any headache, no fever, no chest pain or pressure, no shortness of breath , no abdominal pain. No new focal weakness.   Assessment  & Plan :   Paroxysmal atrial fibrillation in RVR [place] vas 2 score of 6.  Currently on IV Cardizem drip along with oral Eliquis, now on oral Cardizem IV Cardizem titrated off, cardioverted to sinus rhythm on 02/01/2022, unfortunately she switched back to atrial fibrillation on 02/02/2022 with some generalized weakness, EP following her she is currently on Cardizem, plan is to let azithromycin washout and then initiate Tikosyn.  Acute on chronic diastolic CHF with EF 24%.  Likely brought on by RVR, much improved with diuresis continue gentle Lasix repeat IV dose on 02/02/2022 with some potassium supplementation and monitor.  Advance activity.  Clinically much improved and close to being compensated.  Questionable history of pneumonia.  Not convincing.   Clinically ruled out stopped antibiotics.  Essential hypertension.  On Cardizem  GERD.  PPI.  Paracardial effusion.  Defer to cardiology.  Echo noted.  No signs of tamponade.  Dyslipidemia.  On Crestor and Zetia.  Mild pleural effusion.  Diurese and monitor.  History of meningioma.  Outpatient follow-up.       Condition - Extremely Guarded  Family Communication  :  Husband Juleen China [phone] on 01/31/2022 at 12:01 PM   Code Status :  Full  Consults  :  Cards  PUD Prophylaxis : PPI   Procedures  :     DC cardioversion with change to sinus rhythm on 02/01/2022.     TTE 01/11/22 -    1. Left ventricular ejection fraction, by estimation, is 60 to 65%. The left ventricle has normal function. The left ventricle has no regional wall motion abnormalities. Left ventricular diastolic parameters are indeterminate.   2. Right ventricular systolic function is normal. The right ventricular size is normal. There is normal pulmonary artery systolic pressure.   3. Left atrial size was mildly dilated.   4. The mitral valve is abnormal. No evidence of mitral valve regurgitation. No evidence of mitral stenosis.   5. The aortic valve is normal in structure. There is moderate calcification of the aortic valve. There is moderate thickening of the aortic valve. Aortic valve regurgitation is not visualized. Aortic valve sclerosis/calcification is present, without any   evidence of aortic stenosis.   6. The inferior vena cava is normal in size with greater than 50% respiratory variability, suggesting right atrial pressure of 3 mmHg.  CT - 1. No CT evidence of pulmonary artery embolus. 2. Small bilateral pleural effusions with left lung base subsegmental atelectasis. Pneumonia is not excluded. 3. Small pericardial effusion. 4. Aortic Atherosclerosis       Disposition Plan  :    Status is: Observation  DVT Prophylaxis  :     apixaban (ELIQUIS) tablet 5 mg    Lab Results  Component  Value Date   PLT 401 (H) 02/03/2022    Diet :  Diet Order             Diet Heart Room service appropriate? Yes; Fluid consistency: Thin  Diet effective now                    Inpatient Medications  Scheduled Meds:  apixaban  5 mg Oral BID   diltiazem  300 mg Oral Daily   ezetimibe  10 mg Oral Daily   furosemide  40 mg Oral Daily   pantoprazole  40 mg Oral Daily   potassium chloride  40 mEq Oral Once   potassium chloride  40 mEq Oral Once   rosuvastatin  10 mg Oral Daily   Continuous Infusions:   PRN Meds:.acetaminophen **OR** acetaminophen, HYDROcodone-acetaminophen  Time Spent in minutes  30   Lala Lund M.D on 02/03/2022 at 9:53 AM  To page go to [email or web address]   [facility] -  Office  [phone]  See all Orders from today for further details    Objective:   Vitals:   02/02/22 2225 02/02/22 2230 02/03/22 0500 02/03/22 0851  BP:   (!) 148/61 117/60  Pulse:      Resp: '18 18 20 20  '$ Temp:   97.7 F (36.5 C) 97.8 F (36.6 C)  TempSrc:   Oral Oral  SpO2:   98% 98%  Weight:   95.3 kg   Height:        Wt Readings from Last 3 Encounters:  02/03/22 95.3 kg  01/22/22 98.2 kg  01/10/22 100.2 kg     Intake/Output Summary (Last 24 hours) at 02/03/2022 0953 Last data filed at 02/03/2022 0500 Gross per 24 hour  Intake 200 ml  Output 1800 ml  Net -1600 ml     Physical Exam  Awake Alert, No new F.N deficits, Normal  affect [place].AT,PERRAL Supple Neck, No JVD,   Symmetrical Chest wall movement, Good air movement bilaterally, CTAB iRRR,No Gallops, Rubs or new Murmurs,  +ve B.Sounds, Abd Soft, No tenderness,   No Cyanosis, Clubbing or edema     Data Review:    CBC Recent Labs  Lab 01/30/22 1904 01/31/22 0548 02/01/22 0320 02/02/22 0307 02/03/22 0446  WBC 9.6 11.0* 9.6 9.6 8.5  HGB 12.2 12.0 11.5* 11.2* 12.1  HCT 37.1 36.2 35.3* 34.7* 36.9  PLT 441* 451* 420* 403* 401*  MCV 85.9 86.0 86.5 87.0 85.0  MCH 28.2 28.5 28.2 28.1 27.9   MCHC 32.9 33.1 32.6 32.3 32.8  RDW 13.2 13.2 13.4 13.5 13.2  LYMPHSABS  --   --  1.0 0.9 1.3  MONOABS  --   --  1.4* 1.0 0.9  EOSABS  --   --  0.1 0.2 0.2  BASOSABS  --   --  0.0 0.0 0.1    Electrolytes Recent Labs  Lab 01/30/22 1904 01/30/22 1916 01/30/22 2045 01/30/22 2141 01/31/22 0140 01/31/22 0548 02/01/22 0320 02/02/22 0307 02/03/22 0446  NA 135  --   --   --   --  138 138 136 137  K 3.5  --   --   --   --  3.5 3.5 3.7 3.5  CL 98  --   --   --   --  97* 98 94* 98  CO2 27  --   --   --   --  29 27 32 30  GLUCOSE 120*  --   --   --   --  129* 139* 129* 123*  BUN 5*  --   --   --   --  6* '16 13 12  '$ CREATININE 0.53  --   --   --   --  0.67 0.80 0.70 0.59  CALCIUM 8.9  --   --   --   --  8.8* 8.7* 8.4* 8.7*  AST  --   --   --  50*  --  40 31 39 36  ALT  --   --   --  66*  --  59* 48* 51* 48*  ALKPHOS  --   --   --  92  --  92 88 88 94  BILITOT  --   --   --  1.3*  --  1.1 0.6 0.9 0.8  ALBUMIN  --   --   --  2.8*  --  2.8* 2.6* 2.7* 2.6*  MG  --   --  2.2  --   --   --  2.0 2.0 2.1  CRP  --   --   --   --   --  11.4*  --   --   --   PROCALCITON  --   --   --   --  <0.10 <0.10 <0.10  --   --   TSH  --   --   --   --   --  1.415  --   --   --   BNP  --  183.9*  --   --   --   --  70.4 77.8  --     ------------------------------------------------------------------------------------------------------------------ No results for input(s): "CHOL", "HDL", "LDLCALC", "TRIG", "CHOLHDL", "LDLDIRECT" in the last 72 hours.  Lab Results  Component Value Date   HGBA1C 6.0 (H) 02/14/2021    No results for input(s): "TSH", "T4TOTAL", "T3FREE", "THYROIDAB" in  the last 72 hours.  Invalid input(s): "FREET3"  ------------------------------------------------------------------------------------------------------------------ ID Labs Recent Labs  Lab 01/30/22 1904 01/31/22 0140 01/31/22 0548 02/01/22 0320 02/02/22 0307 02/03/22 0446  WBC 9.6  --  11.0* 9.6 9.6 8.5  PLT 441*   --  451* 420* 403* 401*  CRP  --   --  11.4*  --   --   --   PROCALCITON  --  <0.10 <0.10 <0.10  --   --   CREATININE 0.53  --  0.67 0.80 0.70 0.59   Radiology Reports ECHOCARDIOGRAM LIMITED  Result Date: 01/31/2022    ECHOCARDIOGRAM LIMITED REPORT   Patient Name:   MARYLYN APPENZELLER Date of Exam: 01/31/2022 Medical Rec #:  [id number]          Height:       63.0 in Accession #:    [id number]         Weight:       214.3 lb Date of Birth:  [birth year]          BSA:          1.991 m Patient Age:    88 years           BP:           135/65 mmHg Patient Gender: F                  HR:           83 bpm. Exam Location:  Inpatient Procedure: Limited Echo and Cardiac Doppler Indications:    I31.3 Pericardial effusion  History:        Patient has prior history of Echocardiogram examinations, most                 recent 01/11/2022. Arrythmias:Atrial Fibrillation; Risk                 Factors:Hypertension and Dyslipidemia.  Sonographer:    Raquel Sarna Senior RDCS Referring Phys: [id number] Clois Dupes  Sonographer Comments: Limited to evaluate pericardial effusion IMPRESSIONS  1. Left ventricular ejection fraction, by estimation, is 60 to 65%. The left ventricle has normal function. The left ventricle has no regional wall motion abnormalities.  2. Right ventricular systolic function is normal. The right ventricular size is normal.  3. Small pericardial effusion. There is no RV or RA diastolic collapse. There is respiratory inflow variation across the tricuspid valve but not across the mitral valve. The IVC is dilated and does not collapse with inspiration. Findings are indeterminate for tamponade. However, given the lack of RV collapse or mitral valve flow variation, this likely represents volume overload rather than tamponade. a small pericardial effusion is present. The pericardial effusion is circumferential. There is no evidence of cardiac tamponade.  4. The mitral valve is normal in structure. No evidence of mitral valve  regurgitation. No evidence of mitral stenosis.  5. The aortic valve is tricuspid. There is moderate calcification of the aortic valve. There is moderate thickening of the aortic valve. Aortic valve regurgitation is not visualized. No aortic stenosis is present.  6. The inferior vena cava is dilated in size with <50% respiratory variability, suggesting right atrial pressure of 15 mmHg. FINDINGS  Left Ventricle: Left ventricular ejection fraction, by estimation, is 60 to 65%. The left ventricle has normal function. The left ventricle has no regional wall motion abnormalities. The left ventricular internal cavity size was normal in size. There is  no left ventricular hypertrophy. Right Ventricle: The right ventricular  size is normal. No increase in right ventricular wall thickness. Right ventricular systolic function is normal. Left Atrium: Left atrial size was normal in size. Right Atrium: Right atrial size was normal in size. Pericardium: Small pericardial effusion. There is no RV or RA diastolic collapse. There is respiratory inflow variation across the tricuspid valve but not across the mitral valve. The IVC is dilated and does not collapse with inspiration. Findings are indeterminate for tamponade. However, given the lack of RV collapse or mitral valve flow variation, this likely represents volume overload rather than tamponade. A small pericardial effusion is present. The pericardial effusion is circumferential. There is excessive respiratory variation in the tricuspid valve spectral Doppler velocities. There is no evidence of cardiac tamponade. Mitral Valve: The mitral valve is normal in structure. No evidence of mitral valve stenosis. Tricuspid Valve: The tricuspid valve is normal in structure. Tricuspid valve regurgitation is not demonstrated. No evidence of tricuspid stenosis. Aortic Valve: The aortic valve is tricuspid. There is moderate calcification of the aortic valve. There is moderate thickening of the  aortic valve. Aortic valve regurgitation is not visualized. No aortic stenosis is present. Pulmonic Valve: The pulmonic valve was normal in structure. Pulmonic valve regurgitation is not visualized. No evidence of pulmonic stenosis. Aorta: The aortic root is normal in size and structure. Venous: The inferior vena cava is dilated in size with less than 50% respiratory variability, suggesting right atrial pressure of 15 mmHg. IAS/Shunts: No atrial level shunt detected by color flow Doppler. Skeet Latch MD Electronically signed by Skeet Latch MD Signature Date/Time: 01/31/2022/4:37:52 PM    Final    DG Chest Port 1 View  Result Date: 01/31/2022 CLINICAL DATA:  Provided history: Shortness of breath, pneumonia. EXAM: PORTABLE CHEST 1 VIEW COMPARISON:  Prior chest radiographs 01/30/2022 and earlier. Chest CT 01/30/2022. FINDINGS: Persistently enlarged cardiopericardial silhouette. A pericardial effusion is noted on yesterday's chest CT. Aortic atherosclerosis. Persistent prominence of the interstitial lung markings, suggesting interstitial edema. Persistent small partially loculated right pleural effusion. Mild atelectasis at the left lung base. No definite airspace consolidation. No evidence of pneumothorax. No acute bony abnormality identified. IMPRESSION: Persistent prominence of the cardiopericardial silhouette, likely reflecting the presence of a pericardial effusion (as was demonstrated on yesterday's chest CT). Persistent prominence of the interstitial lung markings, suggesting interstitial edema. Persistent small partially loculated right pleural effusion. Mild atelectasis at the left lung base. No definite airspace consolidation. Electronically Signed   By: Kellie Simmering D.O.   On: 01/31/2022 08:08   CT Angio Chest PE W and/or Wo Contrast  Result Date: 01/30/2022 CLINICAL DATA:  Concern for pulmonary embolism. EXAM: CT ANGIOGRAPHY CHEST WITH CONTRAST TECHNIQUE: Multidetector CT imaging of the  chest was performed using the standard protocol during bolus administration of intravenous contrast. Multiplanar CT image reconstructions and MIPs were obtained to evaluate the vascular anatomy. RADIATION DOSE REDUCTION: This exam was performed according to the departmental dose-optimization program which includes automated exposure control, adjustment of the mA and/or kV according to patient size and/or use of iterative reconstruction technique. CONTRAST:  51m OMNIPAQUE IOHEXOL 350 MG/ML SOLN COMPARISON:  Head CT dated 01/10/2022. FINDINGS: Evaluation of this exam is limited due to respiratory motion artifact. Cardiovascular: Top-normal cardiac size. Small pericardial effusion measuring 1 cm in thickness. Three vessel coronary vascular calcification. There is mild atherosclerotic calcification of the thoracic aorta. No aneurysmal dilatation. Evaluation of the pulmonary arteries is limited due to respiratory motion artifact. No pulmonary artery embolus identified. Mediastinum/Nodes: Mildly enlarged lymph nodes  in the prevascular space measuring up to 12 mm short axis. No hilar adenopathy. The esophagus is grossly unremarkable. No mediastinal fluid collection. Lungs/Pleura: Small bilateral pleural effusions with fluid noted along the minor fissure. Left lung base subsegmental atelectasis. Pneumonia is not excluded. No pneumothorax. The central airways are patent. Upper Abdomen: No acute abnormality. Musculoskeletal: No acute osseous pathology. Review of the MIP images confirms the above findings. IMPRESSION: 1. No CT evidence of pulmonary artery embolus. 2. Small bilateral pleural effusions with left lung base subsegmental atelectasis. Pneumonia is not excluded. 3. Small pericardial effusion. 4. Aortic Atherosclerosis (ICD10-I70.0). Electronically Signed   By: Anner Crete M.D.   On: 01/30/2022 23:25   DG Chest 2 View  Result Date: 01/30/2022 CLINICAL DATA:  Shortness of breath EXAM: CHEST - 2 VIEW  COMPARISON:  Previous studies including the examination done earlier today FINDINGS: Transverse diameter of Elnoria Howard is increased. There is interval decrease in pulmonary vascular congestion. There is interval decrease in pulmonary edema. There are no new focal infiltrates. There is pleural density in the lateral aspect of right mid lung field, possibly suggesting small loculated effusion or pleural thickening. There is blunting of both lateral CP angles suggesting small bilateral effusions. There is no pneumothorax. IMPRESSION: Cardiomegaly. There is interval decrease in pulmonary vascular congestion and pulmonary edema. There are no new focal infiltrates. Small bilateral pleural effusions. There is possible loculated effusion in the lateral aspect of right mid lung field. Electronically Signed   By: Elmer Picker M.D.   On: 01/30/2022 19:55

## 2022-02-04 ENCOUNTER — Telehealth (HOSPITAL_COMMUNITY): Payer: Self-pay | Admitting: Pharmacy Technician

## 2022-02-04 ENCOUNTER — Other Ambulatory Visit (HOSPITAL_COMMUNITY): Payer: Self-pay

## 2022-02-04 ENCOUNTER — Other Ambulatory Visit (HOSPITAL_COMMUNITY): Payer: Medicare Other | Admitting: Nurse Practitioner

## 2022-02-04 ENCOUNTER — Ambulatory Visit (HOSPITAL_COMMUNITY): Admission: RE | Admit: 2022-02-04 | Payer: Medicare Other | Source: Home / Self Care | Admitting: Cardiology

## 2022-02-04 ENCOUNTER — Other Ambulatory Visit: Payer: Self-pay

## 2022-02-04 DIAGNOSIS — I4891 Unspecified atrial fibrillation: Secondary | ICD-10-CM | POA: Diagnosis not present

## 2022-02-04 DIAGNOSIS — I4819 Other persistent atrial fibrillation: Secondary | ICD-10-CM | POA: Diagnosis not present

## 2022-02-04 LAB — COMPREHENSIVE METABOLIC PANEL
ALT: 50 U/L — ABNORMAL HIGH (ref 0–44)
AST: 37 U/L (ref 15–41)
Albumin: 2.8 g/dL — ABNORMAL LOW (ref 3.5–5.0)
Alkaline Phosphatase: 92 U/L (ref 38–126)
Anion gap: 10 (ref 5–15)
BUN: 14 mg/dL (ref 8–23)
CO2: 28 mmol/L (ref 22–32)
Calcium: 8.9 mg/dL (ref 8.9–10.3)
Chloride: 99 mmol/L (ref 98–111)
Creatinine, Ser: 0.7 mg/dL (ref 0.44–1.00)
GFR, Estimated: >60 mL/min (ref >60)
Glucose, Bld: 114 mg/dL — ABNORMAL HIGH (ref 70–99)
Potassium: 4 mmol/L (ref 3.5–5.1)
Sodium: 137 mmol/L (ref 135–145)
Total Bilirubin: 1 mg/dL (ref 0.3–1.2)
Total Protein: 6.2 g/dL — ABNORMAL LOW (ref 6.5–8.1)

## 2022-02-04 LAB — MAGNESIUM: Magnesium: 2.1 mg/dL (ref 1.7–2.4)

## 2022-02-04 LAB — CBC WITH DIFFERENTIAL/PLATELET
Abs Immature Granulocytes: 0.02 10*3/uL (ref 0.00–0.07)
Basophils Absolute: 0.1 10*3/uL (ref 0.0–0.1)
Basophils Relative: 1 %
Eosinophils Absolute: 0.3 10*3/uL (ref 0.0–0.5)
Eosinophils Relative: 3 %
HCT: 36.9 % (ref 36.0–46.0)
Hemoglobin: 12.2 g/dL (ref 12.0–15.0)
Immature Granulocytes: 0 %
Lymphocytes Relative: 19 %
Lymphs Abs: 1.5 10*3/uL (ref 0.7–4.0)
MCH: 28.1 pg (ref 26.0–34.0)
MCHC: 33.1 g/dL (ref 30.0–36.0)
MCV: 85 fL (ref 80.0–100.0)
Monocytes Absolute: 0.8 10*3/uL (ref 0.1–1.0)
Monocytes Relative: 10 %
Neutro Abs: 5.5 10*3/uL (ref 1.7–7.7)
Neutrophils Relative %: 67 %
Platelets: 431 10*3/uL — ABNORMAL HIGH (ref 150–400)
RBC: 4.34 MIL/uL (ref 3.87–5.11)
RDW: 13.3 % (ref 11.5–15.5)
WBC: 8.2 10*3/uL (ref 4.0–10.5)
nRBC: 0 % (ref 0.0–0.2)

## 2022-02-04 MED ORDER — DOFETILIDE 250 MCG PO CAPS
500.0000 ug | ORAL_CAPSULE | Freq: Two times a day (BID) | ORAL | Status: DC
  Administered 2022-02-04: 500 ug via ORAL
  Filled 2022-02-04: qty 2

## 2022-02-04 MED ORDER — SODIUM CHLORIDE 0.9% FLUSH
3.0000 mL | Freq: Two times a day (BID) | INTRAVENOUS | Status: DC
  Administered 2022-02-04 – 2022-02-07 (×5): 3 mL via INTRAVENOUS

## 2022-02-04 MED ORDER — SODIUM CHLORIDE 0.9 % IV SOLN: 250.0000 mL | INTRAVENOUS | Status: DC | PRN

## 2022-02-04 MED ORDER — SODIUM CHLORIDE 0.9% FLUSH: 3.0000 mL | INTRAVENOUS | Status: DC | PRN

## 2022-02-04 NOTE — TOC Benefit Eligibility Note (Signed)
Patient Teacher, English as a foreign language completed.    The patient is currently admitted and upon discharge could be taking dofetlide (Tikoksyn) 500 mcg.  The current 30 day co-pay is, $108.47.   The patient is insured through [place], [place] Patient Advocate Specialist [place] Patient Advocate Team Direct Number: [phone]  Fax: [phone]

## 2022-02-04 NOTE — Progress Notes (Signed)
Mobility Specialist Progress Note:   02/04/22 1241  Mobility  Activity Ambulated with assistance in room;Ambulated with assistance to bathroom  Level of Assistance Standby assist, set-up cues, supervision of patient - no hands on  Assistive Device None  Distance Ambulated (ft) 30 ft  Activity Response Tolerated well  $Mobility charge 1 Mobility   Pt received in chair willing to participate in mobility. No complaints of pain. Pts HR increased to 155 bpm in the bathroom. Left in chair with call bell in reach and all need met.   Dorothy Powers Mobility Specialist  

## 2022-02-04 NOTE — Progress Notes (Signed)
PROGRESS NOTE                                                                                                                                                                                                             Patient Demographics:    Dorothy Powers, is a 76 y.o. female, DOB - Jul 27, 1945, GNF:[id number]  Outpatient Primary MD for the patient is Orpah Melter, MD    LOS - 4  Admit date - 01/30/2022    Chief Complaint  Patient presents with   Shortness of Breath   Atrial Fibrillation       Brief Narrative (HPI from H&P)   76 y.o. female with medical history significant of HTN HLD A-fib history of TIA, meningioma.  Presented with few day history of cough, orthopnea and shortness of breath on exertion, she was diagnosed with pneumonia outpatient placed on antibiotics without any improvement.  In the ER she was diagnosed with CHF along with A-fib RVR and admitted to the hospital.    Subjective:   Patient in bed, appears comfortable, denies any headache, no fever, no chest pain or pressure, no shortness of breath , no abdominal pain. No new focal weakness.   Assessment  & Plan :   Paroxysmal atrial fibrillation in RVR [place] vas 2 score of 6.  Currently on IV Cardizem drip along with oral Eliquis, now on oral Cardizem IV Cardizem titrated off, cardioverted to sinus rhythm on 02/01/2022, unfortunately she switched back to atrial fibrillation on 02/02/2022 with some generalized weakness, EP following her she is currently on Cardizem, Tikosyn initiated on 02/04/2022 continue monitoring.  Acute on chronic diastolic CHF with EF 84%.  Likely brought on by RVR, much improved after she was diuresed, appears euvolemic now.  Advance activity.  Clinically much improved and close to being compensated.  Questionable history of pneumonia.  Not convincing.  Clinically ruled out stopped antibiotics.  Essential hypertension.  On  Cardizem  GERD.  PPI.  Paracardial effusion.  Defer to cardiology.  Echo noted.  No signs of tamponade.  Dyslipidemia.  On Crestor and Zetia.  Mild pleural effusion.  Diurese and monitor.  History of meningioma.  Outpatient follow-up.       Condition - Extremely Guarded  Family Communication  :   Husband Juleen China [phone] on 01/31/2022 at 12:01 PM   Code Status :  Full  Consults  :  Cards  PUD Prophylaxis : PPI   Procedures  :     DC cardioversion with change to sinus rhythm on 02/01/2022.     TTE 01/11/22 -    1. Left ventricular ejection fraction, by estimation, is 60 to 65%. The left ventricle has normal function. The left ventricle has no regional wall motion abnormalities. Left ventricular diastolic parameters are indeterminate.   2. Right ventricular systolic function is normal. The right ventricular size is normal. There is normal pulmonary artery systolic pressure.   3. Left atrial size was mildly dilated.   4. The mitral valve is abnormal. No evidence of mitral valve regurgitation. No evidence of mitral stenosis.   5. The aortic valve is normal in structure. There is moderate calcification of the aortic valve. There is moderate thickening of the aortic valve. Aortic valve regurgitation is not visualized. Aortic valve sclerosis/calcification is present, without any   evidence of aortic stenosis.   6. The inferior vena cava is normal in size with greater than 50% respiratory variability, suggesting right atrial pressure of 3 mmHg.  CT - 1. No CT evidence of pulmonary artery embolus. 2. Small bilateral pleural effusions with left lung base subsegmental atelectasis. Pneumonia is not excluded. 3. Small pericardial effusion. 4. Aortic Atherosclerosis       Disposition Plan  :    Status is: Observation  DVT Prophylaxis  :     apixaban (ELIQUIS) tablet 5 mg    Lab Results  Component Value Date   PLT 431 (H) 02/04/2022    Diet :  Diet Order              Diet Heart Room service appropriate? Yes; Fluid consistency: Thin  Diet effective now                    Inpatient Medications  Scheduled Meds:  apixaban  5 mg Oral BID   diltiazem  300 mg Oral Daily   dofetilide  500 mcg Oral BID   ezetimibe  10 mg Oral Daily   furosemide  40 mg Oral Daily   pantoprazole  40 mg Oral Daily   rosuvastatin  10 mg Oral Daily   sodium chloride flush  3 mL Intravenous Q12H   Continuous Infusions:  sodium chloride      PRN Meds:.sodium chloride, acetaminophen **OR** acetaminophen, HYDROcodone-acetaminophen, sodium chloride flush  Time Spent in minutes  30   Lala Lund M.D on 02/04/2022 at 9:09 AM  To page go to [email or web address]   [facility] -  Office  [phone]  See all Orders from today for further details    Objective:   Vitals:   02/03/22 2352 02/04/22 0534 02/04/22 0553 02/04/22 0745  BP: 129/73 (!) 123/91  127/69  Pulse: (!) 119 (!) 123  (!) 111  Resp: '18 20 14 20  '$ Temp: 97.9 F (36.6 C) 98.8 F (37.1 C)  97.9 F (36.6 C)  TempSrc: Oral Oral  Oral  SpO2: 95% 94%  93%  Weight:  95.1 kg    Height:        Wt Readings from Last 3 Encounters:  02/04/22 95.1 kg  01/22/22 98.2 kg  01/10/22 100.2 kg     Intake/Output Summary (Last 24 hours) at 02/04/2022 0909 Last data filed at 02/04/2022 0753 Gross per 24 hour  Intake 940 ml  Output 900 ml  Net 40 ml     Physical Exam  Awake Alert, No new  F.N deficits, Normal affect [place].AT,PERRAL Supple Neck, No JVD,   Symmetrical Chest wall movement, Good air movement bilaterally, CTAB iRRR,No Gallops, Rubs or new Murmurs,  +ve B.Sounds, Abd Soft, No tenderness,   No Cyanosis, Clubbing or edema      Data Review:    CBC Recent Labs  Lab 01/31/22 0548 02/01/22 0320 02/02/22 0307 02/03/22 0446 02/04/22 0528  WBC 11.0* 9.6 9.6 8.5 8.2  HGB 12.0 11.5* 11.2* 12.1 12.2  HCT 36.2 35.3* 34.7* 36.9 36.9  PLT 451* 420* 403* 401* 431*  MCV 86.0 86.5 87.0 85.0  85.0  MCH 28.5 28.2 28.1 27.9 28.1  MCHC 33.1 32.6 32.3 32.8 33.1  RDW 13.2 13.4 13.5 13.2 13.3  LYMPHSABS  --  1.0 0.9 1.3 1.5  MONOABS  --  1.4* 1.0 0.9 0.8  EOSABS  --  0.1 0.2 0.2 0.3  BASOSABS  --  0.0 0.0 0.1 0.1    Electrolytes Recent Labs  Lab 01/30/22 1916 01/30/22 2045 01/30/22 2141 01/31/22 0140 01/31/22 0548 02/01/22 0320 02/02/22 0307 02/03/22 0446 02/04/22 0528  NA  --   --   --   --  138 138 136 137 137  K  --   --   --   --  3.5 3.5 3.7 3.5 4.0  CL  --   --   --   --  97* 98 94* 98 99  CO2  --   --   --   --  29 27 32 30 28  GLUCOSE  --   --   --   --  129* 139* 129* 123* 114*  BUN  --   --   --   --  6* '16 13 12 14  '$ CREATININE  --   --   --   --  0.67 0.80 0.70 0.59 0.70  CALCIUM  --   --   --   --  8.8* 8.7* 8.4* 8.7* 8.9  AST  --   --    < >  --  40 31 39 36 37  ALT  --   --    < >  --  59* 48* 51* 48* 50*  ALKPHOS  --   --    < >  --  92 88 88 94 92  BILITOT  --   --    < >  --  1.1 0.6 0.9 0.8 1.0  ALBUMIN  --   --    < >  --  2.8* 2.6* 2.7* 2.6* 2.8*  MG  --  2.2  --   --   --  2.0 2.0 2.1 2.1  CRP  --   --   --   --  11.4*  --   --   --   --   PROCALCITON  --   --   --  <0.10 <0.10 <0.10  --   --   --   TSH  --   --   --   --  1.415  --   --   --   --   BNP 183.9*  --   --   --   --  70.4 77.8  --   --    < > = values in this interval not displayed.    Lab Results  Component Value Date   HGBA1C 6.0 (H) 02/14/2021     ------------------------------------------------------------------------------------------------------------------ ID Labs Recent Labs  Lab 01/31/22 0140 01/31/22 2774 02/01/22 0320 02/02/22 1287  02/03/22 0446 02/04/22 0528  WBC  --  11.0* 9.6 9.6 8.5 8.2  PLT  --  451* 420* 403* 401* 431*  CRP  --  11.4*  --   --   --   --   PROCALCITON <0.10 <0.10 <0.10  --   --   --   CREATININE  --  0.67 0.80 0.70 0.59 0.70   Radiology Reports ECHOCARDIOGRAM LIMITED  Result Date: 01/31/2022    ECHOCARDIOGRAM LIMITED REPORT    Patient Name:   Dorothy Powers Date of Exam: 01/31/2022 Medical Rec #:  [id number]          Height:       63.0 in Accession #:    [id number]         Weight:       214.3 lb Date of Birth:  [birth year]          BSA:          1.991 m Patient Age:    39 years           BP:           135/65 mmHg Patient Gender: F                  HR:           83 bpm. Exam Location:  Inpatient Procedure: Limited Echo and Cardiac Doppler Indications:    I31.3 Pericardial effusion  History:        Patient has prior history of Echocardiogram examinations, most                 recent 01/11/2022. Arrythmias:Atrial Fibrillation; Risk                 Factors:Hypertension and Dyslipidemia.  Sonographer:    Raquel Sarna Senior RDCS Referring Phys: [id number] Clois Dupes  Sonographer Comments: Limited to evaluate pericardial effusion IMPRESSIONS  1. Left ventricular ejection fraction, by estimation, is 60 to 65%. The left ventricle has normal function. The left ventricle has no regional wall motion abnormalities.  2. Right ventricular systolic function is normal. The right ventricular size is normal.  3. Small pericardial effusion. There is no RV or RA diastolic collapse. There is respiratory inflow variation across the tricuspid valve but not across the mitral valve. The IVC is dilated and does not collapse with inspiration. Findings are indeterminate for tamponade. However, given the lack of RV collapse or mitral valve flow variation, this likely represents volume overload rather than tamponade. a small pericardial effusion is present. The pericardial effusion is circumferential. There is no evidence of cardiac tamponade.  4. The mitral valve is normal in structure. No evidence of mitral valve regurgitation. No evidence of mitral stenosis.  5. The aortic valve is tricuspid. There is moderate calcification of the aortic valve. There is moderate thickening of the aortic valve. Aortic valve regurgitation is not visualized. No aortic stenosis is present.   6. The inferior vena cava is dilated in size with <50% respiratory variability, suggesting right atrial pressure of 15 mmHg. FINDINGS  Left Ventricle: Left ventricular ejection fraction, by estimation, is 60 to 65%. The left ventricle has normal function. The left ventricle has no regional wall motion abnormalities. The left ventricular internal cavity size was normal in size. There is  no left ventricular hypertrophy. Right Ventricle: The right ventricular size is normal. No increase in right ventricular wall thickness. Right ventricular systolic function is normal. Left Atrium: Left atrial size was normal in  size. Right Atrium: Right atrial size was normal in size. Pericardium: Small pericardial effusion. There is no RV or RA diastolic collapse. There is respiratory inflow variation across the tricuspid valve but not across the mitral valve. The IVC is dilated and does not collapse with inspiration. Findings are indeterminate for tamponade. However, given the lack of RV collapse or mitral valve flow variation, this likely represents volume overload rather than tamponade. A small pericardial effusion is present. The pericardial effusion is circumferential. There is excessive respiratory variation in the tricuspid valve spectral Doppler velocities. There is no evidence of cardiac tamponade. Mitral Valve: The mitral valve is normal in structure. No evidence of mitral valve stenosis. Tricuspid Valve: The tricuspid valve is normal in structure. Tricuspid valve regurgitation is not demonstrated. No evidence of tricuspid stenosis. Aortic Valve: The aortic valve is tricuspid. There is moderate calcification of the aortic valve. There is moderate thickening of the aortic valve. Aortic valve regurgitation is not visualized. No aortic stenosis is present. Pulmonic Valve: The pulmonic valve was normal in structure. Pulmonic valve regurgitation is not visualized. No evidence of pulmonic stenosis. Aorta: The aortic root is  normal in size and structure. Venous: The inferior vena cava is dilated in size with less than 50% respiratory variability, suggesting right atrial pressure of 15 mmHg. IAS/Shunts: No atrial level shunt detected by color flow Doppler. Skeet Latch MD Electronically signed by Skeet Latch MD Signature Date/Time: 01/31/2022/4:37:52 PM    Final

## 2022-02-04 NOTE — Progress Notes (Signed)
Pharmacy: Dofetilide (Tikosyn) - Initial Consult Assessment and Electrolyte Replacement  Pharmacy consulted to assist in monitoring and replacing electrolytes in this 76 y.o. female admitted on 01/30/2022 undergoing dofetilide initiation. First dofetilide dose: 02/04/22  Assessment:  Patient Exclusion Criteria: If any screening criteria checked as "Yes", then  patient  should NOT receive dofetilide until criteria item is corrected.  If "Yes" please indicate correction plan.  YES  NO Patient  Exclusion Criteria Correction Plan   '[x]'$   '[]'$   Baseline QTc interval is greater than or equal to 440 msec. IF above YES box checked dofetilide contraindicated unless patient has ICD; then may proceed if QTc 500-550 msec or with known ventricular conduction abnormalities may proceed with QTc 550-600 msec. QTc = 0.49 QTc slightly above 440, EP aware okay to start.   '[]'$   '[x]'$   Patient is known or suspected to have a digoxin level greater than 2 ng/ml: No results found for: "DIGOXIN"     '[]'$   '[x]'$   Creatinine clearance less than 20 ml/min (calculated using Cockcroft-Gault, actual body weight and serum creatinine): Estimated Creatinine Clearance: 63.6 mL/min (by C-G formula based on SCr of 0.7 mg/dL).     '[]'$   '[x]'$  Patient has received drugs known to prolong the QT intervals within the last 48 hours (phenothiazines, tricyclics or tetracyclic antidepressants, erythromycin, H-1 antihistamines, cisapride, fluoroquinolones, azithromycin). Drugs not listed above may have an, as yet, undetected potential to prolong the QT interval, updated information on QT prolonging agents is available at this website:QT prolonging agents or www.crediblemeds.org Azithromycin: list given 7/22 '@0930'$ - starting tikosyn 7/24 PM   '[]'$   '[x]'$   Patient received a dose of hydrochlorothiazide (Oretic) alone or in any combination including triamterene (Dyazide, Maxzide) in the last 48 hours.    '[]'$   '[x]'$  Patient received a medication known  to increase dofetilide plasma concentrations prior to initial dofetilide dose:  Trimethoprim (Primsol, Proloprim) in the last 36 hours Verapamil (Calan, Verelan) in the last 36 hours or a sustained release dose in the last 72 hours Megestrol (Megace) in the last 5 days  Cimetidine (Tagamet) in the last 6 hours Ketoconazole (Nizoral) in the last 24 hours Itraconazole (Sporanox) in the last 48 hours  Prochlorperazine (Compazine) in the last 36 hours     '[]'$   '[x]'$   Patient is known to have a history of torsades de pointes; congenital or acquired long QT syndromes.    '[]'$   '[x]'$   Patient has received a Class 1 antiarrhythmic with less than 2 half-lives since last dose. (Disopyramide, Quinidine, Procainamide, Lidocaine, Mexiletine, Flecainide, Propafenone)    '[]'$   '[x]'$   Patient has received amiodarone therapy in the past 3 months or amiodarone level is greater than 0.3 ng/ml.    Patient has been appropriately anticoagulated with Eliquis '5mg'$  BID for afib.  Labs:    Component Value Date/Time   K 4.0 02/04/2022 0528   MG 2.1 02/04/2022 0528     Plan: Potassium: K >/= 4: Appropriate to initiate Tikosyn, no replacement needed    Magnesium: Mg >2: Appropriate to initiate Tikosyn, no replacement needed     Thank you for allowing pharmacy to participate in this patient's care   Sandford Craze, PharmD. [facility] Acute Care PGY-1  02/04/2022 8:39 AM

## 2022-02-04 NOTE — TOC Progression Note (Signed)
Transition of Care [facility]) - Progression Note    Patient Details  Name: Dorothy Powers MRN: [id number] Date of Birth: May 09, 1946  [facility] of [facility]) CM/SW Contact  Zenon Mayo, RN Phone Number: 02/04/2022, 2:25 PM  Clinical Narrative:     afib with RVR , new CHF, cardioverted on 7/21, back into afib on 7/22, start Tikosyn tonight.  TOC following.          Expected Discharge Plan and Services                                                 Social Determinants of Health (SDOH) Interventions    Readmission Risk Interventions     No data to display

## 2022-02-04 NOTE — Progress Notes (Signed)
Electrophysiology Rounding Note  Patient Name: Dorothy Powers Date of Encounter: 02/04/2022  Primary Cardiologist: Freada Bergeron, MD Electrophysiologist: None   Subjective   NAEO  Inpatient Medications    Scheduled Meds:  apixaban  5 mg Oral BID   diltiazem  300 mg Oral Daily   ezetimibe  10 mg Oral Daily   furosemide  40 mg Oral Daily   pantoprazole  40 mg Oral Daily   rosuvastatin  10 mg Oral Daily   Continuous Infusions:  PRN Meds: acetaminophen **OR** acetaminophen, HYDROcodone-acetaminophen   Vital Signs    Vitals:   02/03/22 1928 02/03/22 2352 02/04/22 0534 02/04/22 0553  BP: (!) 141/77 129/73 (!) 123/91   Pulse: (!) 106 (!) 119 (!) 123   Resp: '20 18 20 14  '$ Temp: 97.9 F (36.6 C) 97.9 F (36.6 C) 98.8 F (37.1 C)   TempSrc: Oral Oral Oral   SpO2: 96% 95% 94%   Weight:   95.1 kg   Height:        Intake/Output Summary (Last 24 hours) at 02/04/2022 0735 Last data filed at 02/04/2022 0530 Gross per 24 hour  Intake 700 ml  Output 900 ml  Net -200 ml   Filed Weights   02/01/22 0500 02/03/22 0500 02/04/22 0534  Weight: 96.9 kg 95.3 kg 95.1 kg    Physical Exam    GEN- The patient is well appearing, alert and oriented x 3 today.   Head- normocephalic, atraumatic Eyes-  Sclera clear, conjunctiva pink Ears- hearing intact Oropharynx- clear Neck- supple Lungs- Clear to ausculation bilaterally, normal work of breathing Heart- Irregularly irregular rate and rhythm, no murmurs, rubs or gallops GI- soft, NT, ND, + BS Extremities- no clubbing or cyanosis. No edema Skin- no rash or lesion Psych- euthymic mood, full affect Neuro- strength and sensation are intact  Labs    CBC Recent Labs    02/03/22 0446 02/04/22 0528  WBC 8.5 8.2  NEUTROABS 6.0 5.5  HGB 12.1 12.2  HCT 36.9 36.9  MCV 85.0 85.0  PLT 401* 098*   Basic Metabolic Panel Recent Labs    02/03/22 0446 02/04/22 0528  NA 137 137  K 3.5 4.0  CL 98 99  CO2 30 28   GLUCOSE 123* 114*  BUN 12 14  CREATININE 0.59 0.70  CALCIUM 8.7* 8.9  MG 2.1 2.1   Liver Function Tests Recent Labs    02/03/22 0446 02/04/22 0528  AST 36 37  ALT 48* 50*  ALKPHOS 94 92  BILITOT 0.8 1.0  PROT 5.9* 6.2*  ALBUMIN 2.6* 2.8*   No results for input(s): "LIPASE", "AMYLASE" in the last 72 hours. Cardiac Enzymes No results for input(s): "CKTOTAL", "CKMB", "CKMBINDEX", "TROPONINI" in the last 72 hours.   Telemetry    AF 110-130s (personally reviewed)  Radiology    No results found.  Patient Profile     Dorothy Powers is a 76 y.o. female with a hx of HTN, HLD, TIA and AF who was seen 02/02/2022 for the evaluation of AF at the request of Dr Margaretann Loveless.  Assessment & Plan    Persistent AF Symptomatic. Rhythm control indicated given link to her decompensated HF hospitalizations. Planning for dofetilide given baseline conduction disease and HF. Azithro stopped 02/02/2022. Plan to start dofetilide 02/04/2022 PM evening dose.  Pharmacy consulted Continue apixaban K 4.0 this am, Mg 2.1. Will gently supp K as she is getting lasix.  Plan to start Tikosyn this evening.    2  HFpEF Continue lasix PO '40mg'$  daily and follow response. May not need once in sinus.   For questions or updates, please contact [place] Please consult [email or web address] for contact info under Cardiology/STEMI.  Signed, Shirley Friar, PA-C  02/04/2022, 7:35 AM

## 2022-02-04 NOTE — Telephone Encounter (Signed)
Pharmacy Patient Advocate Encounter  Insurance verification completed.    The patient is insured through [facility] Part D  The patient is currently admitted and ran test claims for the following: dofetlide (Tikoksyn) 500 mcg..  Copays and coinsurance results were relayed to Inpatient clinical team.

## 2022-02-04 NOTE — Progress Notes (Signed)
Physical Therapy Treatment Patient Details Name: Dorothy Powers MRN: [id number] DOB: Aug 29, 1945 Today's Date: 02/04/2022   History of Present Illness Pt is a 76 y/o female admitted secondary to a fib with RVR. Thought to have pericardial effusion. PMH includes HTN, TIA, and a fib.    PT Comments    Pt received in chair, agreeable to therapy session with good participation in stair negotiation (step-ups on platform in room) and short distance gait trial. Gait distance limited due to tachycardia (HR to 150's bpm with room distances and steps x5) and improves slightly with seated rest breaks. Pt reports moderate fatigue (5/10 RPE) after gait/stair trials and reports she is feeling much more tired than normal with limited mobility. Pt continues to benefit from PT services to progress toward functional mobility goals.    Recommendations for follow up therapy are one component of a multi-disciplinary discharge planning process, led by the attending physician.  Recommendations may be updated based on patient status, additional functional criteria and insurance authorization.  Follow Up Recommendations  No PT follow up     Assistance Recommended at Discharge Intermittent Supervision/Assistance  Patient can return home with the following Help with stairs or ramp for entrance;Assistance with cooking/housework   Equipment Recommendations  None recommended by PT    Recommendations for Other Services       Precautions / Restrictions Precautions Precautions: Other (comment) Precaution Comments: watch HR Restrictions Weight Bearing Restrictions: No     Mobility  Bed Mobility               General bed mobility comments: received/remained in chair    Transfers Overall transfer level: Needs assistance Equipment used: None Transfers: Sit to/from Stand Sit to Stand: Modified independent (Device/Increase time)           General transfer comment: recliner chair, good hand  placement, increased time; also to low toilet height    Ambulation/Gait Ambulation/Gait assistance: Supervision Gait Distance (Feet): 50 Feet (82f, seated break, 512f seated break, 3082fAssistive device: None Gait Pattern/deviations: Step-through pattern, Decreased stride length Gait velocity: Decreased     General Gait Details: HR to 153 bpm briefly and remains in Afib, HR mostly 120-140 bpm with gait/stair ascent in room and seated in chair 110-120's bpm.   Stairs Stairs: Yes Stairs assistance: Min guard Stair Management: Step to pattern, Forwards (RUE HHA) Number of Stairs: 10 (5 steps x2 trials with seated rest break) General stair comments: cues for activity pacing and step sequencing, no overt LOB   Wheelchair Mobility    Modified Rankin (Stroke Patients Only)       Balance Overall balance assessment: Mild deficits observed, not formally tested                               Standardized Balance Assessment Standardized Balance Assessment : Dynamic Gait Index   Dynamic Gait Index Level Surface: Mild Impairment Change in Gait Speed: Mild Impairment Gait with Horizontal Head Turns: Normal Gait with Vertical Head Turns: Normal Gait and Pivot Turn: Mild Impairment Step Over Obstacle: Mild Impairment Step Around Obstacles: Mild Impairment Steps: Moderate Impairment Total Score: 17      Cognition Arousal/Alertness: Awake/alert Behavior During Therapy: WFL for tasks assessed/performed Overall Cognitive Status: Within Functional Limits for tasks assessed              General Comments: appropriately cautious with mobility progression        Exercises  General Comments General comments (skin integrity, edema, etc.): Pt reports 5/10 modified RPE after step-ups in room and short gait trials x2      Pertinent Vitals/Pain Pain Assessment Pain Assessment: No/denies pain           PT Goals (current goals can now be found in the care  plan section) Acute Rehab PT Goals Patient Stated Goal: to go home once HR improves PT Goal Formulation: With patient Time For Goal Achievement: 02/14/22 Progress towards PT goals: Progressing toward goals    Frequency    Min 3X/week      PT Plan Current plan remains appropriate       AM-PAC PT "6 Clicks" Mobility   Outcome Measure  Help needed turning from your back to your side while in a flat bed without using bedrails?: None Help needed moving from lying on your back to sitting on the side of a flat bed without using bedrails?: A Little Help needed moving to and from a bed to a chair (including a wheelchair)?: None Help needed standing up from a chair using your arms (e.g., wheelchair or bedside chair)?: None Help needed to walk in hospital room?: A Little Help needed climbing 3-5 steps with a railing? : A Little 6 Click Score: 21    End of Session Equipment Utilized During Treatment: Gait belt Activity Tolerance: Patient limited by fatigue;Other (comment) (tachy but improves with standing/seated breaks) Patient left: in chair;with call bell/phone within reach Nurse Communication: Mobility status PT Visit Diagnosis: Muscle weakness (generalized) (M62.81);Unsteadiness on feet (R26.81)     Time: 2956-2130 PT Time Calculation (min) (ACUTE ONLY): 17 min  Charges:  $Gait Training: 8-22 mins                     Mohit Zirbes P., PTA Acute Rehabilitation Services Secure Chat Preferred 9a-5:30pm Office: [place] 02/04/2022, 1:46 PM

## 2022-02-05 ENCOUNTER — Other Ambulatory Visit: Payer: Self-pay

## 2022-02-05 DIAGNOSIS — I4819 Other persistent atrial fibrillation: Secondary | ICD-10-CM | POA: Diagnosis not present

## 2022-02-05 DIAGNOSIS — I4891 Unspecified atrial fibrillation: Secondary | ICD-10-CM | POA: Diagnosis not present

## 2022-02-05 LAB — MAGNESIUM: Magnesium: 2 mg/dL (ref 1.7–2.4)

## 2022-02-05 LAB — BASIC METABOLIC PANEL
Anion gap: 13 (ref 5–15)
BUN: 18 mg/dL (ref 8–23)
CO2: 26 mmol/L (ref 22–32)
Calcium: 8.8 mg/dL — ABNORMAL LOW (ref 8.9–10.3)
Chloride: 95 mmol/L — ABNORMAL LOW (ref 98–111)
Creatinine, Ser: 0.64 mg/dL (ref 0.44–1.00)
GFR, Estimated: >60 mL/min (ref >60)
Glucose, Bld: 105 mg/dL — ABNORMAL HIGH (ref 70–99)
Potassium: 3.6 mmol/L (ref 3.5–5.1)
Sodium: 134 mmol/L — ABNORMAL LOW (ref 135–145)

## 2022-02-05 MED ORDER — ALUM & MAG HYDROXIDE-SIMETH 200-200-20 MG/5ML PO SUSP: 30.0000 mL | ORAL | Status: DC | PRN

## 2022-02-05 MED ORDER — DOFETILIDE 250 MCG PO CAPS
500.0000 ug | ORAL_CAPSULE | Freq: Two times a day (BID) | ORAL | Status: DC
  Administered 2022-02-05: 500 ug via ORAL
  Filled 2022-02-05: qty 2

## 2022-02-05 MED ORDER — POTASSIUM CHLORIDE CRYS ER 20 MEQ PO TBCR
40.0000 meq | EXTENDED_RELEASE_TABLET | Freq: Once | ORAL | Status: AC
  Administered 2022-02-05: 40 meq via ORAL
  Filled 2022-02-05: qty 2

## 2022-02-05 MED ORDER — MAGNESIUM SULFATE 2 GM/50ML IV SOLN
2.0000 g | Freq: Once | INTRAVENOUS | Status: AC
  Administered 2022-02-05: 2 g via INTRAVENOUS
  Filled 2022-02-05: qty 50

## 2022-02-05 NOTE — Progress Notes (Addendum)
Morning EKG reviewed    Shows has converted to NSR with borderline QTc at ~470-480 ms when measured manually and corrected for QRS  Potassium3.6 (07/25 0127) (supped) Magnesium  2.0 (07/25 0127) (supped) Creatinine, ser  0.64 (07/25 0127)  Shirley Friar, PA-C  Pager: [phone]  02/05/2022 11:16 AM   ADDENDUM  Reviewed with Dr. Quentin Ore and QT is prolonged.     Will HOLD QT tonight and dose tomorrow am depending on what QT looks like.   Legrand Como [address] [place], PA-C  02/05/2022 1:21 PM

## 2022-02-05 NOTE — Progress Notes (Signed)
Electrophysiology Rounding Note  Patient Name: Dorothy Powers Date of Encounter: 02/05/2022  Primary Cardiologist: Freada Bergeron, MD  Electrophysiologist: None    Subjective   Pt remains in afib on Tikosyn 500 mcg BID   QTc from EKG last pm shows stable QTc at ~470, likely better when corrected for rate.   The patient is doing well today.  At this time, the patient denies chest pain, shortness of breath, or any new concerns.  Inpatient Medications    Scheduled Meds:  apixaban  5 mg Oral BID   diltiazem  300 mg Oral Daily   dofetilide  500 mcg Oral BID   ezetimibe  10 mg Oral Daily   furosemide  40 mg Oral Daily   pantoprazole  40 mg Oral Daily   rosuvastatin  10 mg Oral Daily   sodium chloride flush  3 mL Intravenous Q12H   Continuous Infusions:  sodium chloride     PRN Meds: sodium chloride, acetaminophen **OR** acetaminophen, HYDROcodone-acetaminophen, sodium chloride flush   Vital Signs    Vitals:   02/04/22 1608 02/04/22 2032 02/05/22 0038 02/05/22 0345  BP: 138/69 132/77  115/77  Pulse: (!) 108 (!) 108  (!) 118  Resp: '20 16  17  '$ Temp: 98.1 F (36.7 C) 98.3 F (36.8 C)  98.4 F (36.9 C)  TempSrc: Oral Oral  Oral  SpO2: 97% 94%  94%  Weight:   94.1 kg   Height:        Intake/Output Summary (Last 24 hours) at 02/05/2022 0656 Last data filed at 02/05/2022 0300 Gross per 24 hour  Intake 840 ml  Output 1650 ml  Net -810 ml   Filed Weights   02/03/22 0500 02/04/22 0534 02/05/22 0038  Weight: 95.3 kg 95.1 kg 94.1 kg    Physical Exam    GEN- The patient is well appearing, alert and oriented x 3 today.   Head- normocephalic, atraumatic Eyes-  Sclera clear, conjunctiva pink Ears- hearing intact Oropharynx- clear Neck- supple Lungs- Clear to ausculation bilaterally, normal work of breathing Heart- Irregularly irregular rate and rhythm, no murmurs, rubs or gallops GI- soft, NT, ND, + BS Extremities- no clubbing, cyanosis, or edema Skin-  no rash or lesion Psych- euthymic mood, full affect Neuro- strength and sensation are intact  Labs    CBC Recent Labs    02/03/22 0446 02/04/22 0528  WBC 8.5 8.2  NEUTROABS 6.0 5.5  HGB 12.1 12.2  HCT 36.9 36.9  MCV 85.0 85.0  PLT 401* 086*   Basic Metabolic Panel Recent Labs    02/04/22 0528 02/05/22 0127  NA 137 134*  K 4.0 3.6  CL 99 95*  CO2 28 26  GLUCOSE 114* 105*  BUN 14 18  CREATININE 0.70 0.64  CALCIUM 8.9 8.8*  MG 2.1 2.0    Potassium  Date/Time Value Ref Range Status  02/05/2022 01:27 AM 3.6 3.5 - 5.1 mmol/L Final   Magnesium  Date/Time Value Ref Range Status  02/05/2022 01:27 AM 2.0 1.7 - 2.4 mg/dL Final    Comment:    Performed at [facility], [address], [place], [place] [address]    Telemetry    AF with RVR 100-120s (personally reviewed)  Radiology    No results found.   Patient Profile     Dorothy Powers is a 76 y.o. female with a past medical history significant for persistent atrial fibrillation.  They were admitted for tikosyn load.   Assessment &  Plan    Persistent atrial fibrillation Pt remains in afib on Tikosyn 500 mcg BID  Continue Eliquis Creatinine, ser  0.64 (07/25 0127) Magnesium  2.0 (07/25 0127) Potassium3.6 (07/25 0127) Supplement both K and Mg per protocol   2 HFpEF Continue lasix 40 mg daily.  Assess need once in sinus.  ADDENDUM  Pt converted to NSR shortly after 0700.  QT looks OK in NSR when corrected for QRS  For questions or updates, please contact [place] Please consult [email or web address] for contact info under Cardiology/STEMI.  Signed, Shirley Friar, PA-C  02/05/2022, 6:56 AM

## 2022-02-05 NOTE — Progress Notes (Signed)
Pharmacy: Dofetilide (Tikosyn) - Follow Up Assessment and Electrolyte Replacement  Pharmacy consulted to assist in monitoring and replacing electrolytes in this 76 y.o. female admitted on 01/30/2022 undergoing dofetilide initiation. First dofetilide dose: 02/04/22  Labs:    Component Value Date/Time   K 3.6 02/05/2022 0127   MG 2.0 02/05/2022 0127     Plan: Potassium: K 3.5-3.7:  Give KCl 60 mEq po x1  >> 40 mEQ ordered by EP APP  Magnesium: Mg 1.8-2: Give Mg 2 gm IV x1     Thank you for allowing pharmacy to participate in this patient's care   Arrie Senate, PharmD, BCPS, [place] Pharmacist [phone] Please check AMION for all [place] numbers 02/05/2022

## 2022-02-05 NOTE — Progress Notes (Signed)
Mobility Specialist - Progress Note   02/05/22 1144  Therapy Vitals  Temp 97.9 F (36.6 C)  Temp Source Oral  Pulse Rate 83  Resp 20  BP 117/69  Oxygen Therapy  SpO2 96 %  Mobility  Activity Ambulated independently in hallway  Level of Assistance Independent  Assistive Device None  Distance Ambulated (ft) 80 ft  Activity Response Tolerated well  $Mobility charge 1 Mobility   Pt received from recliner agreeable to mobility. Pt returned to recliner with call bell in reach and all needs met.   Paulla Dolly Mobility Specialist

## 2022-02-05 NOTE — Progress Notes (Signed)
PROGRESS NOTE                                                                                                                                                                                                             Patient Demographics:    Dorothy Powers, is a 76 y.o. female, DOB - [birth year], XBL:[id number]  Outpatient Primary MD for the patient is Orpah Melter, MD    LOS - 5  Admit date - 01/30/2022    Chief Complaint  Patient presents with   Shortness of Breath   Atrial Fibrillation       Brief Narrative (HPI from H&P)   76 y.o. female with medical history significant of HTN HLD A-fib history of TIA, meningioma.  Presented with few day history of cough, orthopnea and shortness of breath on exertion, she was diagnosed with pneumonia outpatient placed on antibiotics without any improvement.  In the ER she was diagnosed with CHF along with A-fib RVR and admitted to the hospital.    Subjective:   Patient in bed, appears comfortable, denies any headache, no fever, no chest pain or pressure, no shortness of breath , no abdominal pain. No new focal weakness.   Assessment  & Plan :   Paroxysmal atrial fibrillation in RVR [place] vas 2 score of 6.  Currently on IV Cardizem drip along with oral Eliquis, now on oral Cardizem IV Cardizem titrated off, cardioverted to sinus rhythm on 02/01/2022, unfortunately she switched back to atrial fibrillation on 02/02/2022 with some generalized weakness, EP following her she is currently on Cardizem, Tikosyn initiated on 02/04/2022 continue monitoring x 5 days.  Acute on chronic diastolic CHF with EF 30%.  Likely brought on by RVR, much improved after she was diuresed, appears euvolemic now.  Advance activity.  Clinically much improved and close to being compensated.  Questionable history of pneumonia.  Not convincing.  Clinically ruled out stopped antibiotics.  Essential hypertension.  On  Cardizem  GERD.  PPI.  Paracardial effusion.  Defer to cardiology.  Echo noted.  No signs of tamponade.  Dyslipidemia.  On Crestor and Zetia.  Mild pleural effusion.  Diurese and monitor.  History of meningioma.  Outpatient follow-up.       Condition - Extremely Guarded  Family Communication  :   Husband Juleen China [phone] on 01/31/2022 at 12:01 PM  Code Status :  Full  Consults  :  Cards  PUD Prophylaxis : PPI   Procedures  :     DC cardioversion with change to sinus rhythm on 02/01/2022.     TTE 01/11/22 -    1. Left ventricular ejection fraction, by estimation, is 60 to 65%. The left ventricle has normal function. The left ventricle has no regional wall motion abnormalities. Left ventricular diastolic parameters are indeterminate.   2. Right ventricular systolic function is normal. The right ventricular size is normal. There is normal pulmonary artery systolic pressure.   3. Left atrial size was mildly dilated.   4. The mitral valve is abnormal. No evidence of mitral valve regurgitation. No evidence of mitral stenosis.   5. The aortic valve is normal in structure. There is moderate calcification of the aortic valve. There is moderate thickening of the aortic valve. Aortic valve regurgitation is not visualized. Aortic valve sclerosis/calcification is present, without any   evidence of aortic stenosis.   6. The inferior vena cava is normal in size with greater than 50% respiratory variability, suggesting right atrial pressure of 3 mmHg.  CT - 1. No CT evidence of pulmonary artery embolus. 2. Small bilateral pleural effusions with left lung base subsegmental atelectasis. Pneumonia is not excluded. 3. Small pericardial effusion. 4. Aortic Atherosclerosis       Disposition Plan  :    Status is: Observation  DVT Prophylaxis  :     apixaban (ELIQUIS) tablet 5 mg    Lab Results  Component Value Date   PLT 431 (H) 02/04/2022    Diet :  Diet Order              Diet Heart Room service appropriate? Yes; Fluid consistency: Thin  Diet effective now                    Inpatient Medications  Scheduled Meds:  apixaban  5 mg Oral BID   diltiazem  300 mg Oral Daily   dofetilide  500 mcg Oral BID   ezetimibe  10 mg Oral Daily   furosemide  40 mg Oral Daily   pantoprazole  40 mg Oral Daily   rosuvastatin  10 mg Oral Daily   sodium chloride flush  3 mL Intravenous Q12H   Continuous Infusions:  sodium chloride     magnesium sulfate bolus IVPB 2 g (02/05/22 0958)    PRN Meds:.sodium chloride, acetaminophen **OR** acetaminophen, HYDROcodone-acetaminophen, sodium chloride flush  Time Spent in minutes  30   Lala Lund M.D on 02/05/2022 at 10:45 AM  To page go to [email or web address]   [facility] -  Office  [phone]  See all Orders from today for further details    Objective:   Vitals:   02/04/22 1608 02/04/22 2032 02/05/22 0038 02/05/22 0345  BP: 138/69 132/77  115/77  Pulse: (!) 108 (!) 108  (!) 118  Resp: '20 16  17  '$ Temp: 98.1 F (36.7 C) 98.3 F (36.8 C)  98.4 F (36.9 C)  TempSrc: Oral Oral  Oral  SpO2: 97% 94%  94%  Weight:   94.1 kg   Height:        Wt Readings from Last 3 Encounters:  02/05/22 94.1 kg  01/22/22 98.2 kg  01/10/22 100.2 kg     Intake/Output Summary (Last 24 hours) at 02/05/2022 1045 Last data filed at 02/05/2022 0811 Gross per 24 hour  Intake 540 ml  Output 1450 ml  Net -  910 ml     Physical Exam  Awake Alert, No new F.N deficits, Normal affect [place].AT,PERRAL Supple Neck, No JVD,   Symmetrical Chest wall movement, Good air movement bilaterally, CTAB iRRR,No Gallops, Rubs or new Murmurs,  +ve B.Sounds, Abd Soft, No tenderness,   No Cyanosis, Clubbing or edema    Data Review:    CBC Recent Labs  Lab 01/31/22 0548 02/01/22 0320 02/02/22 0307 02/03/22 0446 02/04/22 0528  WBC 11.0* 9.6 9.6 8.5 8.2  HGB 12.0 11.5* 11.2* 12.1 12.2  HCT 36.2 35.3* 34.7* 36.9 36.9  PLT 451*  420* 403* 401* 431*  MCV 86.0 86.5 87.0 85.0 85.0  MCH 28.5 28.2 28.1 27.9 28.1  MCHC 33.1 32.6 32.3 32.8 33.1  RDW 13.2 13.4 13.5 13.2 13.3  LYMPHSABS  --  1.0 0.9 1.3 1.5  MONOABS  --  1.4* 1.0 0.9 0.8  EOSABS  --  0.1 0.2 0.2 0.3  BASOSABS  --  0.0 0.0 0.1 0.1    Electrolytes Recent Labs  Lab 01/30/22 1916 01/30/22 2045 01/31/22 0140 01/31/22 0548 02/01/22 0320 02/02/22 0307 02/03/22 0446 02/04/22 0528 02/05/22 0127  NA  --   --   --  138 138 136 137 137 134*  K  --   --   --  3.5 3.5 3.7 3.5 4.0 3.6  CL  --   --   --  97* 98 94* 98 99 95*  CO2  --   --   --  29 27 32 '30 28 26  '$ GLUCOSE  --   --   --  129* 139* 129* 123* 114* 105*  BUN  --   --   --  6* '16 13 12 14 18  '$ CREATININE  --   --   --  0.67 0.80 0.70 0.59 0.70 0.64  CALCIUM  --   --   --  8.8* 8.7* 8.4* 8.7* 8.9 8.8*  AST  --    < >  --  40 31 39 36 37  --   ALT  --    < >  --  59* 48* 51* 48* 50*  --   ALKPHOS  --    < >  --  92 88 88 94 92  --   BILITOT  --    < >  --  1.1 0.6 0.9 0.8 1.0  --   ALBUMIN  --    < >  --  2.8* 2.6* 2.7* 2.6* 2.8*  --   MG  --    < >  --   --  2.0 2.0 2.1 2.1 2.0  CRP  --   --   --  11.4*  --   --   --   --   --   PROCALCITON  --   --  <0.10 <0.10 <0.10  --   --   --   --   TSH  --   --   --  1.415  --   --   --   --   --   BNP 183.9*  --   --   --  70.4 77.8  --   --   --    < > = values in this interval not displayed.    Lab Results  Component Value Date   HGBA1C 6.0 (H) 02/14/2021     ------------------------------------------------------------------------------------------------------------------ ID Labs Recent Labs  Lab 01/31/22 0140 01/31/22 2725 02/01/22 0320 02/02/22 3664 02/03/22 4034 02/04/22 7425  02/05/22 0127  WBC  --  11.0* 9.6 9.6 8.5 8.2  --   PLT  --  451* 420* 403* 401* 431*  --   CRP  --  11.4*  --   --   --   --   --   PROCALCITON <0.10 <0.10 <0.10  --   --   --   --   CREATININE  --  0.67 0.80 0.70 0.59 0.70 0.64   Radiology Reports No  results found.

## 2022-02-05 NOTE — Care Management Important Message (Signed)
Important Message  Patient Details  Name: Dorothy Powers MRN: [id number] Date of Birth: 1946/07/04   Medicare Important Message Given:  Yes     Shelda Altes 02/05/2022, 8:56 AM

## 2022-02-06 ENCOUNTER — Encounter (HOSPITAL_COMMUNITY)
Admission: EM | Disposition: A | Discharge status: Home / Self Care | Payer: Self-pay | Source: Home / Self Care | Attending: Internal Medicine

## 2022-02-06 DIAGNOSIS — I5033 Acute on chronic diastolic (congestive) heart failure: Secondary | ICD-10-CM

## 2022-02-06 DIAGNOSIS — I10 Essential (primary) hypertension: Secondary | ICD-10-CM | POA: Diagnosis not present

## 2022-02-06 DIAGNOSIS — D329 Benign neoplasm of meninges, unspecified: Secondary | ICD-10-CM | POA: Diagnosis present

## 2022-02-06 DIAGNOSIS — E669 Obesity, unspecified: Secondary | ICD-10-CM

## 2022-02-06 DIAGNOSIS — I4891 Unspecified atrial fibrillation: Secondary | ICD-10-CM | POA: Diagnosis not present

## 2022-02-06 DIAGNOSIS — K219 Gastro-esophageal reflux disease without esophagitis: Secondary | ICD-10-CM | POA: Diagnosis not present

## 2022-02-06 LAB — CBC
HCT: 36.6 % (ref 36.0–46.0)
Hemoglobin: 11.8 g/dL — ABNORMAL LOW (ref 12.0–15.0)
MCH: 27.8 pg (ref 26.0–34.0)
MCHC: 32.2 g/dL (ref 30.0–36.0)
MCV: 86.3 fL (ref 80.0–100.0)
Platelets: 422 10*3/uL — ABNORMAL HIGH (ref 150–400)
RBC: 4.24 MIL/uL (ref 3.87–5.11)
RDW: 13.5 % (ref 11.5–15.5)
WBC: 8 10*3/uL (ref 4.0–10.5)
nRBC: 0 % (ref 0.0–0.2)

## 2022-02-06 LAB — BASIC METABOLIC PANEL
Anion gap: 7 (ref 5–15)
BUN: 16 mg/dL (ref 8–23)
CO2: 30 mmol/L (ref 22–32)
Calcium: 8.7 mg/dL — ABNORMAL LOW (ref 8.9–10.3)
Chloride: 97 mmol/L — ABNORMAL LOW (ref 98–111)
Creatinine, Ser: 0.74 mg/dL (ref 0.44–1.00)
GFR, Estimated: >60 mL/min (ref >60)
Glucose, Bld: 107 mg/dL — ABNORMAL HIGH (ref 70–99)
Potassium: 3.9 mmol/L (ref 3.5–5.1)
Sodium: 134 mmol/L — ABNORMAL LOW (ref 135–145)

## 2022-02-06 LAB — MAGNESIUM: Magnesium: 2.4 mg/dL (ref 1.7–2.4)

## 2022-02-06 SURGERY — CARDIOVERSION
Anesthesia: General

## 2022-02-06 MED ORDER — AMIODARONE HCL 200 MG PO TABS
200.0000 mg | ORAL_TABLET | Freq: Two times a day (BID) | ORAL | Status: DC
  Administered 2022-02-06 – 2022-02-07 (×3): 200 mg via ORAL
  Filled 2022-02-06 (×3): qty 1

## 2022-02-06 NOTE — Progress Notes (Signed)
Mobility Specialist Progress Note:   02/06/22 1055  Mobility  Activity Ambulated with assistance in hallway  Level of Assistance Standby assist, set-up cues, supervision of patient - no hands on  Assistive Device None  Distance Ambulated (ft) 250 ft  Activity Response Tolerated well  $Mobility charge 1 Mobility   Pt received in chair willing to participate in mobility. No complaints of pain. Left in chair with call bell in reach and all needs met.   [facility] Gabriela Giannelli Mobility Specialist

## 2022-02-06 NOTE — Assessment & Plan Note (Signed)
Calculated BMI 36,5

## 2022-02-06 NOTE — Progress Notes (Signed)
Electrophysiology Rounding Note  Patient Name: Dorothy Powers Date of Encounter: 02/06/2022  Primary Cardiologist: Freada Bergeron, MD Electrophysiologist: Dr. Quentin Ore   Subjective   NAEO. Remains in NSR.   Disappointed to have to come off tikosyn.   Inpatient Medications    Scheduled Meds:  amiodarone  200 mg Oral BID   apixaban  5 mg Oral BID   diltiazem  300 mg Oral Daily   ezetimibe  10 mg Oral Daily   furosemide  40 mg Oral Daily   pantoprazole  40 mg Oral Daily   rosuvastatin  10 mg Oral Daily   sodium chloride flush  3 mL Intravenous Q12H   Continuous Infusions:  sodium chloride     PRN Meds: sodium chloride, acetaminophen **OR** acetaminophen, alum & mag hydroxide-simeth, HYDROcodone-acetaminophen, sodium chloride flush   Vital Signs    Vitals:   02/05/22 1100 02/05/22 1144 02/05/22 1956 02/06/22 0411  BP:  117/69 136/71 139/64  Pulse:  83 89 94  Resp: '17 20 20 19  '$ Temp:  97.9 F (36.6 C) 98.7 F (37.1 C) 97.8 F (36.6 C)  TempSrc:  Oral Oral Oral  SpO2:  96% 93% 94%  Weight:    93.6 kg  Height:        Intake/Output Summary (Last 24 hours) at 02/06/2022 0720 Last data filed at 02/06/2022 0414 Gross per 24 hour  Intake 1500 ml  Output 1050 ml  Net 450 ml   Filed Weights   02/04/22 0534 02/05/22 0038 02/06/22 0411  Weight: 95.1 kg 94.1 kg 93.6 kg    Physical Exam    GEN- The patient is well appearing, alert and oriented x 3 today.   Head- normocephalic, atraumatic Eyes-  Sclera clear, conjunctiva pink Ears- hearing intact Oropharynx- clear Neck- supple Lungs- Clear to ausculation bilaterally, normal work of breathing Heart- Regular rate and rhythm, no murmurs, rubs or gallops GI- soft, NT, ND, + BS Extremities- no clubbing or cyanosis. No edema Skin- no rash or lesion Psych- euthymic mood, full affect Neuro- strength and sensation are intact  Labs    CBC Recent Labs    02/04/22 0528  WBC 8.2  NEUTROABS 5.5  HGB 12.2   HCT 36.9  MCV 85.0  PLT 466*   Basic Metabolic Panel Recent Labs    02/05/22 0127 02/06/22 0128  NA 134* 134*  K 3.6 3.9  CL 95* 97*  CO2 26 30  GLUCOSE 105* 107*  BUN 18 16  CREATININE 0.64 0.74  CALCIUM 8.8* 8.7*  MG 2.0 2.4   Liver Function Tests Recent Labs    02/04/22 0528  AST 37  ALT 50*  ALKPHOS 92  BILITOT 1.0  PROT 6.2*  ALBUMIN 2.8*   No results for input(s): "LIPASE", "AMYLASE" in the last 72 hours. Cardiac Enzymes No results for input(s): "CKTOTAL", "CKMB", "CKMBINDEX", "TROPONINI" in the last 72 hours.   Telemetry    NSR 80-90s (personally reviewed)  Radiology    No results found.  Patient Profile     Dorothy Powers is a 76 y.o. female with a past medical history significant for persistent atrial fibrillation. EP was consulted and decided on Tikosyn load. Unfortunately she had significant QT prolongation, and this was stopped.   Assessment & Plan    Persistent atrial fibrillation Continue Eliquis QT has prolonged on Tikosyn Start amiodarone 200 mg BID. Monitor 24 hours.  Poor candidate for ablation given body habitus.    2 HFpEF Continue lasix 40  mg daily and continue to assess need.  May need less/less often maintaining NSR.   Would monitor today and plan home tomorrow on Amiodarone.   For questions or updates, please contact [place] Please consult [email or web address] for contact info under Cardiology/STEMI.  Signed, Shirley Friar, PA-C  02/06/2022, 7:20 AM

## 2022-02-06 NOTE — Assessment & Plan Note (Addendum)
Echocardiogram with preserved LV systolic function, LV EF 60 to 65%, preserved RV systolic function, small pericardial effusion.   Patient was placed on furosemide, negative fluid balance was achieved, - 3,866, with significant improvement of his symptoms.   At the time of her discharge will continue with furosemide 40 mg daily. Rate and rhythm control atrial fibrillation.  Further guideline medical therapy to start as outpatient.   Acute cardiogenic pulmonary edema with small bilateral pleural effusions.   At the time of her discharge her oxygenation is 95% on room air.

## 2022-02-06 NOTE — Hospital Course (Addendum)
Dorothy Powers was admitted to the hospital with the working diagnosis of atrial fibrillation with rapid ventricular response.   76 yo female with the past medical history of hypertension, atrial fibrillation, TIA, dyslipidemia and meningioma who presented with dyspnea. As outpatient she was diagnosed with pneumonia and placed on antibiotic therapy. Despite medical therapy her dyspnea continue to worsened, including orthopnea. She had a follow up as outpatient and chest radiograph showed worsening infiltrates suggesting pulmonary edema. She was referred to the hospital for further evaluation. On her initial physical examination her HR was in the 120's, blood pressure 155/100, RR 20 and 02 saturation 96%, lungs with no wheezing or rales, heart with S1 and S2 present irregularly irregular, abdomen soft and trace lower extremity edema.    Na 135, K 3,5 Cl 98, bicarbonate 27, glucose 120 bun 5 cr 0,53 BNP 183  Wbc 9,6 hgb 12,2 plt 441  Urine analysis with SG 1,005. 0-5 wbc and 0-5 rbc.   Chest radiograph with hilar vascular congestion bilaterally, with small right sided loculated pleural effusion.   CT chest with no pulmonary embolism. Small bilateral pleural effusion, left base atelectasis.  EKG 130 bpm, left axis deviation, left anterior fascicular block, right bundle branch block, atrial fibrillation rhythm, with no significant ST segment or T wave changes.   Patient was placed on diltiazem for rate control. Anticoagulation with apixaban.  07/21 direct current cardioversion, recovering sinus rhythm. 07/22 back in atrial fibrillation.  07/22 sEP was consulted with recommendations to start patient no dofetilide, after stopping azithromycin for  48 hrs. 07/25 qtc continue to be prolonged and decision was made to start patient on amiodarone.

## 2022-02-06 NOTE — Assessment & Plan Note (Signed)
Continue with statin and ezetimibe.

## 2022-02-06 NOTE — Progress Notes (Addendum)
Progress Note   Patient: Dorothy Powers:[id number] DOB: 1946/04/26 DOA: 01/30/2022     6 DOS: the patient was seen and examined on 02/06/2022   Brief hospital course: Dorothy Powers was admitted to the hospital with the working diagnosis of atrial fibrillation with rapid ventricular response.   76 yo female with the past medical history of hypertension, atrial fibrillation, TIA, dyslipidemia and meningioma who presented with dyspnea. As outpatient she was diagnosed with pneumonia and placed on antibiotic therapy. Despite medical therapy her dyspnea continue to worsened, including orthopnea. She had a follow up as outpatient and chest radiograph showed worsening infiltrates suggesting pulmonary edema. She was referred to the hospital for further evaluation. On her initial physical examination her HR was in the 120's, blood pressure 155/100, RR 20 and 02 saturation 96%, lungs with no wheezing or rales, heart with S1 and S2 present irregularly irregular, abdomen soft and trace lower extremity edema.    Na 135, K 3,5 Cl 98, bicarbonate 27, glucose 120 bun 5 cr 0,53 BNP 183  Wbc 9,6 hgb 12,2 plt 441  Urine analysis with SG 1,005. 0-5 wbc and 0-5 rbc.   Chest radiograph with hilar vascular congestion bilaterally, with small right sided loculated pleural effusion.   CT chest with no pulmonary embolism. Small bilateral pleural effusion, left base atelectasis.  EKG 130 bpm, left axis deviation, left anterior fascicular block, right bundle branch block, atrial fibrillation rhythm, with no significant ST segment or T wave changes.   Patient was placed on diltiazem for rate control. Anticoagulation with apixaban.  07/21 direct current cardioversion, recovering sinus rhythm. 07/22 back in atrial fibrillation.  07/22 sEP was consulted with recommendations to start patient no dofetilide, after stopping azithromycin for  48 hrs. 07/25 qtc continue to be prolonged and decision was made to start patient  on amiodarone.   Assessment and Plan: * Atrial fibrillation with RVR [facility]) Patient continue sinus rhythm, personally reviewed telemetry.  Plan to continue rate and rhythm control with diltiazem and amiodarone.  Anticoagulation with apixaban Out of bed to chair tid with meals Pt and Ot Continue telemetry monitoring Possible dc home tomorrow.   Acute on chronic diastolic CHF (congestive heart failure) (HCC) Echocardiogram with preserved LV systolic function, LV EF 60 to 65%, preserved RV systolic function, small pericardial effusion.   Urine output is 3,474 ml Systolic blood pressure is 136 mmHg range.   Plan to continue diuresis with furosemide.  Continue blood pressure control.   Acute cardiogenic pulmonary edema with small bilateral pleural effusions.    CAP (community acquired pneumonia) Pneumonia has been ruled out/ no sepsis.  No further antibiotic therapy.   Essential hypertension Continue blood pressure monitoring.   Gastro-esophageal reflux disease without esophagitis Continue pantoprazole.   Pure hypercholesterolemia Continue with statin and ezetimibe.   Meningioma ([place]) Follow up as outpatient.   Class 2 obesity Calculated BMI 36,5         Subjective: Patient is feeling well, dyspnea improving, she is out of bed to the chair.   Physical Exam: Vitals:   02/05/22 1144 02/05/22 1956 02/06/22 0411 02/06/22 0915  BP: 117/69 136/71 139/64 136/70  Pulse: 83 89 94   Resp: '20 20 19   '$ Temp: 97.9 F (36.6 C) 98.7 F (37.1 C) 97.8 F (36.6 C)   TempSrc: Oral Oral Oral   SpO2: 96% 93% 94%   Weight:   93.6 kg   Height:       Neurology awake and alert ENT with  no pallor Cardiovascular with S1 and S2 present and rhythmic No JVD No lower extremity edema Respiratory with no wheezing or rales Abdomen not distended  Data Reviewed:    Family Communication: no family at the bedside   Disposition: Status is: Inpatient Remains inpatient appropriate  because: telemetry monitoring   Planned Discharge Destination: Home possible dc home tomorrow      Author: Tawni Millers, MD 02/06/2022 11:32 AM  For on call review [email or web address].

## 2022-02-06 NOTE — Progress Notes (Signed)
Physical Therapy Treatment Patient Details Name: Dorothy Powers MRN: [id number] DOB: [birth year] Today's Date: 02/06/2022   History of Present Illness Pt is a 76 y/o female admitted secondary to a fib with RVR. Thought to have pericardial effusion. PMH includes HTN, TIA, and a fib.    PT Comments    Pt tolerates therapy well today, ambulating limited community distances with no AD and HR maintained in sinus rhythm. Pt also participates in therapeutic exercise. Continued therapy will benefit the pt for continued improvements in strength and activity tolerance, and for stair training. Further rehab addressing her deficits will assist her in progressing her functional mobility and independence. PT would like to assess dynamic gait tasks and self-monitoring of symptoms next session.  Recommendations for follow up therapy are one component of a multi-disciplinary discharge planning process, led by the attending physician.  Recommendations may be updated based on patient status, additional functional criteria and insurance authorization.  Follow Up Recommendations  No PT follow up     Assistance Recommended at Discharge PRN  Patient can return home with the following Help with stairs or ramp for entrance;Assistance with cooking/housework   Equipment Recommendations  None recommended by PT    Recommendations for Other Services       Precautions / Restrictions Precautions Precautions: Other (comment) Precaution Comments: watch HR Restrictions Weight Bearing Restrictions: No     Mobility  Bed Mobility                    Transfers Overall transfer level: Independent Equipment used: None Transfers: Sit to/from Stand Sit to Stand: Independent                Ambulation/Gait Ambulation/Gait assistance: Independent (Didn't challenge pt or do dynamic gait tasks; HR stable during session but continue to monitor) Gait Distance (Feet): 300 Feet Assistive device:  None Gait Pattern/deviations: Step-through pattern, Decreased stride length Gait velocity: Decreased Gait velocity interpretation: <1.8 ft/sec, indicate of risk for recurrent falls   General Gait Details: Pt with slowed but functional gait. HR stable in low 100s. Pt independent with simple gait but no dynamic gait tasks performed   Stairs             Wheelchair Mobility    Modified Rankin (Stroke Patients Only)       Balance Overall balance assessment: Mild deficits observed, not formally tested                                          Cognition Arousal/Alertness: Awake/alert Behavior During Therapy: WFL for tasks assessed/performed Overall Cognitive Status: Within Functional Limits for tasks assessed                                          Exercises Other Exercises Other Exercises: Seated Marches, LAQ, Glute sets; 10 reps each (3s isometric holds for LAQ and 2s isometric holds for seated marches)    General Comments General comments (skin integrity, edema, etc.): VSS on RA      Pertinent Vitals/Pain Pain Assessment Pain Assessment: No/denies pain    Home Living                          Prior Function  PT Goals (current goals can now be found in the care plan section) Acute Rehab PT Goals Patient Stated Goal: to go home once HR improves PT Goal Formulation: With patient Time For Goal Achievement: 02/14/22 Potential to Achieve Goals: Good Progress towards PT goals: Progressing toward goals    Frequency    Min 3X/week      PT Plan Current plan remains appropriate    Co-evaluation              AM-PAC PT "6 Clicks" Mobility   Outcome Measure  Help needed turning from your back to your side while in a flat bed without using bedrails?: None Help needed moving from lying on your back to sitting on the side of a flat bed without using bedrails?: None Help needed moving to and from a bed  to a chair (including a wheelchair)?: None Help needed standing up from a chair using your arms (e.g., wheelchair or bedside chair)?: None Help needed to walk in hospital room?: None Help needed climbing 3-5 steps with a railing? : A Little 6 Click Score: 23    End of Session         PT Visit Diagnosis: Muscle weakness (generalized) (M62.81);Unsteadiness on feet (R26.81)     Time: 1336-1400 PT Time Calculation (min) (ACUTE ONLY): 24 min  Charges:  $Gait Training: 8-22 mins $Therapeutic Exercise: 8-22 mins                     Hall Busing, SPT Acute Rehabilitation Office #: [phone]    Hall Busing 02/06/2022, 4:46 PM

## 2022-02-06 NOTE — Assessment & Plan Note (Signed)
Continue blood pressure monitoring.  Patient will be discharge with diltiazem and diuresis with furosemide.

## 2022-02-06 NOTE — Assessment & Plan Note (Signed)
Continue pantoprazole. °

## 2022-02-06 NOTE — Assessment & Plan Note (Signed)
Follow up as outpatient.  

## 2022-02-06 NOTE — Assessment & Plan Note (Addendum)
Patient continue sinus rhythm, personally reviewed telemetry.  Plan to continue rate and rhythm control with diltiazem and amiodarone.  Anticoagulation with apixaban  Patient is being discharge with amiodarone 200 mg bid, dose will need to be reduced as outpatient per cardiology recommendations.

## 2022-02-06 NOTE — Assessment & Plan Note (Addendum)
Pneumonia has been ruled out/ no sepsis.  No further antibiotic therapy.

## 2022-02-07 ENCOUNTER — Other Ambulatory Visit (HOSPITAL_COMMUNITY): Payer: Self-pay

## 2022-02-07 DIAGNOSIS — I5033 Acute on chronic diastolic (congestive) heart failure: Secondary | ICD-10-CM | POA: Diagnosis not present

## 2022-02-07 DIAGNOSIS — I4891 Unspecified atrial fibrillation: Secondary | ICD-10-CM | POA: Diagnosis not present

## 2022-02-07 DIAGNOSIS — E7800 Pure hypercholesterolemia, unspecified: Secondary | ICD-10-CM | POA: Diagnosis not present

## 2022-02-07 DIAGNOSIS — K219 Gastro-esophageal reflux disease without esophagitis: Secondary | ICD-10-CM | POA: Diagnosis not present

## 2022-02-07 LAB — BASIC METABOLIC PANEL
Anion gap: 8 (ref 5–15)
BUN: 16 mg/dL (ref 8–23)
CO2: 28 mmol/L (ref 22–32)
Calcium: 8.9 mg/dL (ref 8.9–10.3)
Chloride: 98 mmol/L (ref 98–111)
Creatinine, Ser: 0.79 mg/dL (ref 0.44–1.00)
GFR, Estimated: >60 mL/min (ref >60)
Glucose, Bld: 118 mg/dL — ABNORMAL HIGH (ref 70–99)
Potassium: 4 mmol/L (ref 3.5–5.1)
Sodium: 134 mmol/L — ABNORMAL LOW (ref 135–145)

## 2022-02-07 LAB — MAGNESIUM: Magnesium: 2.2 mg/dL (ref 1.7–2.4)

## 2022-02-07 MED ORDER — AMIODARONE HCL 200 MG PO TABS
200.0000 mg | ORAL_TABLET | Freq: Two times a day (BID) | ORAL | 0 refills | Status: DC
  Filled 2022-02-07: qty 60, 30d supply, fill #0

## 2022-02-07 MED ORDER — POTASSIUM CHLORIDE CRYS ER 20 MEQ PO TBCR
20.0000 meq | EXTENDED_RELEASE_TABLET | Freq: Every day | ORAL | 0 refills | Status: DC
  Filled 2022-02-07: qty 30, 30d supply, fill #0

## 2022-02-07 MED ORDER — FUROSEMIDE 40 MG PO TABS
40.0000 mg | ORAL_TABLET | Freq: Every day | ORAL | 0 refills | Status: DC
  Filled 2022-02-07: qty 30, 30d supply, fill #0

## 2022-02-07 MED ORDER — POTASSIUM CHLORIDE CRYS ER 20 MEQ PO TBCR
20.0000 meq | EXTENDED_RELEASE_TABLET | Freq: Every day | ORAL | Status: DC
  Administered 2022-02-07: 20 meq via ORAL
  Filled 2022-02-07: qty 1

## 2022-02-07 MED ORDER — DILTIAZEM HCL ER COATED BEADS 300 MG PO CP24
300.0000 mg | ORAL_CAPSULE | Freq: Every day | ORAL | 0 refills | Status: DC
  Filled 2022-02-07: qty 30, 30d supply, fill #0

## 2022-02-07 MED ORDER — ORAL CARE MOUTH RINSE: 15.0000 mL | OROMUCOSAL | Status: DC | PRN

## 2022-02-07 NOTE — Discharge Instructions (Addendum)

## 2022-02-07 NOTE — Discharge Summary (Signed)
Physician Discharge Summary   Patient: Dorothy Powers MRN: [id number] DOB: July 21, 1945  Admit date:     01/30/2022  Discharge date: 02/07/22  Discharge Physician: Jimmy Picket Brittany Osier   PCP: Orpah Melter, MD   Recommendations at discharge:    Patient will continue rhythm and rate control with amiodarone and diltiazem Dose of amiodarone will need to be further decreased as outpatient Continue anticoagulation with apixaban Placed on furosemide daily for diuresis along with Kcl supplementation, follow up renal function and electrolytes as outpatient.  Further guideline directed therapy for heart failure as outpatient as tolerated.   Discharge Diagnoses: Principal Problem:   Atrial fibrillation with RVR (HCC) Active Problems:   Acute on chronic diastolic CHF (congestive heart failure) (HCC)   CAP (community acquired pneumonia)   Essential hypertension   Gastro-esophageal reflux disease without esophagitis   Pure hypercholesterolemia   Meningioma (HCC)   Class 2 obesity  Resolved Problems:   * No resolved hospital problems. [facility] Course: Dorothy Powers was admitted to the hospital with the working diagnosis of atrial fibrillation with rapid ventricular response.   76 yo female with the past medical history of hypertension, atrial fibrillation, TIA, dyslipidemia and meningioma who presented with dyspnea. As outpatient she was diagnosed with pneumonia and placed on antibiotic therapy. Despite medical therapy her dyspnea continue to worsened, including orthopnea. She had a follow up as outpatient and chest radiograph showed worsening infiltrates suggesting pulmonary edema. She was referred to the hospital for further evaluation. On her initial physical examination her HR was in the 120's, blood pressure 155/100, RR 20 and 02 saturation 96%, lungs with no wheezing or rales, heart with S1 and S2 present irregularly irregular, abdomen soft and trace lower extremity edema.    Na  135, K 3,5 Cl 98, bicarbonate 27, glucose 120 bun 5 cr 0,53 BNP 183  Wbc 9,6 hgb 12,2 plt 441  Urine analysis with SG 1,005. 0-5 wbc and 0-5 rbc.   Chest radiograph with hilar vascular congestion bilaterally, with small right sided loculated pleural effusion.   CT chest with no pulmonary embolism. Small bilateral pleural effusion, left base atelectasis.  EKG 130 bpm, left axis deviation, left anterior fascicular block, right bundle branch block, atrial fibrillation rhythm, with no significant ST segment or T wave changes.   Patient was placed on diltiazem for rate control. Anticoagulation with apixaban.  07/21 direct current cardioversion, recovering sinus rhythm. 07/22 back in atrial fibrillation.  07/22 EP was consulted with recommendations to start patient no dofetilide, after stopping azithromycin for  48 hrs. 07/25 qtc continue to be prolonged and decision was made to start patient on amiodarone.   Patient converted to sinus rhythm, plan to continue with oral diltiazem and amiodarone Follow up as outpatient.   Assessment and Plan: * Atrial fibrillation with RVR [facility]) Patient continue sinus rhythm, personally reviewed telemetry.  Plan to continue rate and rhythm control with diltiazem and amiodarone.  Anticoagulation with apixaban  Patient is being discharge with amiodarone 200 mg bid, dose will need to be reduced as outpatient per cardiology recommendations.   Acute on chronic diastolic CHF (congestive heart failure) (HCC) Echocardiogram with preserved LV systolic function, LV EF 60 to 65%, preserved RV systolic function, small pericardial effusion.   Patient was placed on furosemide, negative fluid balance was achieved, - 3,866, with significant improvement of his symptoms.   At the time of her discharge will continue with furosemide 40 mg daily. Rate and rhythm control atrial fibrillation.  Further guideline medical therapy to start as outpatient.   Acute cardiogenic  pulmonary edema with small bilateral pleural effusions.   At the time of her discharge her oxygenation is 95% on room air.    CAP (community acquired pneumonia) Pneumonia has been ruled out/ no sepsis.  No further antibiotic therapy.   Essential hypertension Continue blood pressure monitoring.  Patient will be discharge with diltiazem and diuresis with furosemide.   Gastro-esophageal reflux disease without esophagitis Continue pantoprazole.   Pure hypercholesterolemia Continue with statin and ezetimibe.   Meningioma ([place]) Follow up as outpatient.   Class 2 obesity Calculated BMI 36,5          Consultants: cardiology electrophysiology  Procedures performed: direct current cardioversion   Disposition: Home Diet recommendation:  Discharge Diet Orders (From admission, onward)     Start     Ordered   02/07/22 0000  Diet - low sodium heart healthy        02/07/22 1328           Cardiac diet DISCHARGE MEDICATION: Allergies as of 02/07/2022       Reactions   Codeine Other (See Comments)   "Couldn't move one night for a short time, after taking the medicaiton"   Lovastatin Other (See Comments)   Leg pain   Sulfa Antibiotics Other (See Comments)   "Felt weak"        Medication List     STOP taking these medications    acetaminophen 650 MG CR tablet Commonly known as: TYLENOL   atenolol 50 MG tablet Commonly known as: TENORMIN   cefdinir 300 MG capsule Commonly known as: OMNICEF   doxycycline 50 MG capsule Commonly known as: VIBRAMYCIN   ondansetron 4 MG tablet Commonly known as: Zofran       TAKE these medications    albuterol 108 (90 Base) MCG/ACT inhaler Commonly known as: VENTOLIN HFA Inhale 1-2 puffs into the lungs every 6 (six) hours as needed for wheezing.   amiodarone 200 MG tablet Commonly known as: PACERONE Take 1 tablet (200 mg total) by mouth 2 (two) times daily.   apixaban 5 MG Tabs tablet Commonly known as: ELIQUIS Take  1 tablet (5 mg total) by mouth 2 (two) times daily.   azelastine 0.1 % nasal spray Commonly known as: ASTELIN Place 1 spray into both nostrils 2 (two) times daily.   CALCIUM-VITAMIN D PO Take 2 tablets by mouth at bedtime.   cetirizine 10 MG tablet Commonly known as: ZYRTEC Take 10 mg by mouth daily.   CO Q 10 PO Take 200 mg by mouth daily.   diltiazem 300 MG 24 hr capsule Commonly known as: CARDIZEM CD Take 1 capsule (300 mg total) by mouth daily. Start taking on: February 08, 2022   diphenhydrAMINE 25 MG tablet Commonly known as: BENADRYL Take 25-50 mg by mouth 2 (two) times daily as needed for allergies.   ezetimibe 10 MG tablet Commonly known as: ZETIA Take 10 mg by mouth daily.   furosemide 40 MG tablet Commonly known as: LASIX Take 1 tablet (40 mg total) by mouth daily. Start taking on: February 08, 2022 What changed:  medication strength how much to take when to take this reasons to take this   glucosamine-chondroitin 500-400 MG tablet Take 1 tablet by mouth in the morning and at bedtime. Triple strength   pantoprazole 40 MG tablet Commonly known as: PROTONIX Take 40 mg by mouth daily.   potassium chloride SA 20 MEQ tablet Commonly  known as: KLOR-CON M Take 1 tablet (20 mEq total) by mouth daily.   rosuvastatin 10 MG tablet Commonly known as: CRESTOR Take 10 mg by mouth daily.   SYSTANE OP Place 1 drop into both eyes daily as needed (allergies).   VITAMIN C PO Take 1 tablet by mouth daily.   Vitamin D3 125 MCG (5000 UT) Tabs Take 5,000 Units by mouth daily.        Follow-up Information     Orpah Melter, MD. Go on 02/19/2022.   Specialty: Family Medicine Why: '@4'$ :15pm with Carmine Savoy information: [place] [address] [phone]         Freada Bergeron, MD .   Specialties: Cardiology, Radiology Contact information: [address] [address] [phone]                 Discharge Exam: Danley Danker Weights   02/05/22 0038 02/06/22 0411 02/07/22 0451  Weight: 94.1 kg 93.6 kg 93.4 kg   BP (!) 149/85 (BP Location: Right Arm)   Pulse 96   Temp 98.2 F (36.8 C) (Oral)   Resp 18   Ht '5\' 3"'$  (1.6 m)   Wt 93.4 kg   SpO2 95%   BMI 36.46 kg/m   Patient with improvement in her symptoms  Neurology awake and alert ENT with no pallor Cardiovascular with S1 and S2 present and rhythmic Respiratory with no rales or wheezing Abdomen not distended No lower extremity edema   Condition at discharge: stable  The results of significant diagnostics from this hospitalization (including imaging, microbiology, ancillary and laboratory) are listed below for reference.   Imaging Studies: ECHOCARDIOGRAM LIMITED  Result Date: 01/31/2022    ECHOCARDIOGRAM LIMITED REPORT   Patient Name:   KAHLA RISDON Date of Exam: 01/31/2022 Medical Rec #:  [id number]          Height:       63.0 in Accession #:    [id number]         Weight:       214.3 lb Date of Birth:  10-Jan-1946          BSA:          1.991 m Patient Age:    35 years           BP:           135/65 mmHg Patient Gender: F                  HR:           83 bpm. Exam Location:  Inpatient Procedure: Limited Echo and Cardiac Doppler Indications:    I31.3 Pericardial effusion  History:        Patient has prior history of Echocardiogram examinations, most                 recent 01/11/2022. Arrythmias:Atrial Fibrillation; Risk                 Factors:Hypertension and Dyslipidemia.  Sonographer:    Raquel Sarna Senior RDCS Referring Phys: [id number] Clois Dupes  Sonographer Comments: Limited to evaluate pericardial effusion IMPRESSIONS  1. Left ventricular ejection fraction, by estimation, is 60 to 65%. The left ventricle has normal function. The left ventricle has no regional wall motion abnormalities.  2. Right ventricular systolic function is normal. The right ventricular size is normal.  3. Small pericardial effusion. There is no RV or RA  diastolic collapse.  There is respiratory inflow variation across the tricuspid valve but not across the mitral valve. The IVC is dilated and does not collapse with inspiration. Findings are indeterminate for tamponade. However, given the lack of RV collapse or mitral valve flow variation, this likely represents volume overload rather than tamponade. a small pericardial effusion is present. The pericardial effusion is circumferential. There is no evidence of cardiac tamponade.  4. The mitral valve is normal in structure. No evidence of mitral valve regurgitation. No evidence of mitral stenosis.  5. The aortic valve is tricuspid. There is moderate calcification of the aortic valve. There is moderate thickening of the aortic valve. Aortic valve regurgitation is not visualized. No aortic stenosis is present.  6. The inferior vena cava is dilated in size with <50% respiratory variability, suggesting right atrial pressure of 15 mmHg. FINDINGS  Left Ventricle: Left ventricular ejection fraction, by estimation, is 60 to 65%. The left ventricle has normal function. The left ventricle has no regional wall motion abnormalities. The left ventricular internal cavity size was normal in size. There is  no left ventricular hypertrophy. Right Ventricle: The right ventricular size is normal. No increase in right ventricular wall thickness. Right ventricular systolic function is normal. Left Atrium: Left atrial size was normal in size. Right Atrium: Right atrial size was normal in size. Pericardium: Small pericardial effusion. There is no RV or RA diastolic collapse. There is respiratory inflow variation across the tricuspid valve but not across the mitral valve. The IVC is dilated and does not collapse with inspiration. Findings are indeterminate for tamponade. However, given the lack of RV collapse or mitral valve flow variation, this likely represents volume overload rather than tamponade. A small pericardial effusion is present.  The pericardial effusion is circumferential. There is excessive respiratory variation in the tricuspid valve spectral Doppler velocities. There is no evidence of cardiac tamponade. Mitral Valve: The mitral valve is normal in structure. No evidence of mitral valve stenosis. Tricuspid Valve: The tricuspid valve is normal in structure. Tricuspid valve regurgitation is not demonstrated. No evidence of tricuspid stenosis. Aortic Valve: The aortic valve is tricuspid. There is moderate calcification of the aortic valve. There is moderate thickening of the aortic valve. Aortic valve regurgitation is not visualized. No aortic stenosis is present. Pulmonic Valve: The pulmonic valve was normal in structure. Pulmonic valve regurgitation is not visualized. No evidence of pulmonic stenosis. Aorta: The aortic root is normal in size and structure. Venous: The inferior vena cava is dilated in size with less than 50% respiratory variability, suggesting right atrial pressure of 15 mmHg. IAS/Shunts: No atrial level shunt detected by color flow Doppler. Skeet Latch MD Electronically signed by Skeet Latch MD Signature Date/Time: 01/31/2022/4:37:52 PM    Final    DG Chest Port 1 View  Result Date: 01/31/2022 CLINICAL DATA:  Provided history: Shortness of breath, pneumonia. EXAM: PORTABLE CHEST 1 VIEW COMPARISON:  Prior chest radiographs 01/30/2022 and earlier. Chest CT 01/30/2022. FINDINGS: Persistently enlarged cardiopericardial silhouette. A pericardial effusion is noted on yesterday's chest CT. Aortic atherosclerosis. Persistent prominence of the interstitial lung markings, suggesting interstitial edema. Persistent small partially loculated right pleural effusion. Mild atelectasis at the left lung base. No definite airspace consolidation. No evidence of pneumothorax. No acute bony abnormality identified. IMPRESSION: Persistent prominence of the cardiopericardial silhouette, likely reflecting the presence of a pericardial  effusion (as was demonstrated on yesterday's chest CT). Persistent prominence of the interstitial lung markings, suggesting interstitial edema. Persistent small partially loculated right pleural effusion. Mild  atelectasis at the left lung base. No definite airspace consolidation. Electronically Signed   By: Kellie Simmering D.O.   On: 01/31/2022 08:08   CT Angio Chest PE W and/or Wo Contrast  Result Date: 01/30/2022 CLINICAL DATA:  Concern for pulmonary embolism. EXAM: CT ANGIOGRAPHY CHEST WITH CONTRAST TECHNIQUE: Multidetector CT imaging of the chest was performed using the standard protocol during bolus administration of intravenous contrast. Multiplanar CT image reconstructions and MIPs were obtained to evaluate the vascular anatomy. RADIATION DOSE REDUCTION: This exam was performed according to the departmental dose-optimization program which includes automated exposure control, adjustment of the mA and/or kV according to patient size and/or use of iterative reconstruction technique. CONTRAST:  63m OMNIPAQUE IOHEXOL 350 MG/ML SOLN COMPARISON:  Head CT dated 01/10/2022. FINDINGS: Evaluation of this exam is limited due to respiratory motion artifact. Cardiovascular: Top-normal cardiac size. Small pericardial effusion measuring 1 cm in thickness. Three vessel coronary vascular calcification. There is mild atherosclerotic calcification of the thoracic aorta. No aneurysmal dilatation. Evaluation of the pulmonary arteries is limited due to respiratory motion artifact. No pulmonary artery embolus identified. Mediastinum/Nodes: Mildly enlarged lymph nodes in the prevascular space measuring up to 12 mm short axis. No hilar adenopathy. The esophagus is grossly unremarkable. No mediastinal fluid collection. Lungs/Pleura: Small bilateral pleural effusions with fluid noted along the minor fissure. Left lung base subsegmental atelectasis. Pneumonia is not excluded. No pneumothorax. The central airways are patent. Upper  Abdomen: No acute abnormality. Musculoskeletal: No acute osseous pathology. Review of the MIP images confirms the above findings. IMPRESSION: 1. No CT evidence of pulmonary artery embolus. 2. Small bilateral pleural effusions with left lung base subsegmental atelectasis. Pneumonia is not excluded. 3. Small pericardial effusion. 4. Aortic Atherosclerosis (ICD10-I70.0). Electronically Signed   By: AAnner CreteM.D.   On: 01/30/2022 23:25   DG Chest 2 View  Result Date: 01/30/2022 CLINICAL DATA:  Shortness of breath EXAM: CHEST - 2 VIEW COMPARISON:  Previous studies including the examination done earlier today FINDINGS: Transverse diameter of HElnoria Howardis increased. There is interval decrease in pulmonary vascular congestion. There is interval decrease in pulmonary edema. There are no new focal infiltrates. There is pleural density in the lateral aspect of right mid lung field, possibly suggesting small loculated effusion or pleural thickening. There is blunting of both lateral CP angles suggesting small bilateral effusions. There is no pneumothorax. IMPRESSION: Cardiomegaly. There is interval decrease in pulmonary vascular congestion and pulmonary edema. There are no new focal infiltrates. Small bilateral pleural effusions. There is possible loculated effusion in the lateral aspect of right mid lung field. Electronically Signed   By: PElmer PickerM.D.   On: 01/30/2022 19:55   DG Chest 2 View  Result Date: 01/28/2022 CLINICAL DATA:  COUGH and shortness of breath since 3 weeks EXAM: CHEST - 2 VIEW COMPARISON:  January 10 2022 FINDINGS: The heart size and mediastinal contours are mildly prominent, stable. Thoracic aorta is ectatic. There is some perihilar stranding with faint airspace opacities seen most prominent at the right lower lung zone. No pleural effusion. The visualized skeletal structures are unremarkable. IMPRESSION: Perihilar stranding with faint patchy airspace opacities most prominent at the  right lower lung zone. The opacity likely on the basis of pneumonia at the right lower lung zone is new since the previous study. Electronically Signed   By: AFrazier RichardsM.D.   On: 01/28/2022 16:33   ECHOCARDIOGRAM COMPLETE  Result Date: 01/11/2022    ECHOCARDIOGRAM REPORT   Patient Name:  Helmut Muster Date of Exam: 01/11/2022 Medical Rec #:  [id number]          Height:       62.0 in Accession #:    [id number]         Weight:       221.0 lb Date of Birth:  [birth year]          BSA:          1.995 m Patient Age:    37 years           BP:           110/73 mmHg Patient Gender: F                  HR:           80 bpm. Exam Location:  Inpatient Procedure: 2D Echo Indications:    Atrial fibrillation  History:        Patient has prior history of Echocardiogram examinations, most                 recent 02/15/2021. Arrythmias:Atrial Fibrillation; Risk                 Factors:Hypertension and Dyslipidemia.  Sonographer:    Johny Chess RDCS Referring Phys: [id number] [place]  1. Left ventricular ejection fraction, by estimation, is 60 to 65%. The left ventricle has normal function. The left ventricle has no regional wall motion abnormalities. Left ventricular diastolic parameters are indeterminate.  2. Right ventricular systolic function is normal. The right ventricular size is normal. There is normal pulmonary artery systolic pressure.  3. Left atrial size was mildly dilated.  4. The mitral valve is abnormal. No evidence of mitral valve regurgitation. No evidence of mitral stenosis.  5. The aortic valve is normal in structure. There is moderate calcification of the aortic valve. There is moderate thickening of the aortic valve. Aortic valve regurgitation is not visualized. Aortic valve sclerosis/calcification is present, without any  evidence of aortic stenosis.  6. The inferior vena cava is normal in size with greater than 50% respiratory variability, suggesting right atrial pressure of 3 mmHg.  FINDINGS  Left Ventricle: Left ventricular ejection fraction, by estimation, is 60 to 65%. The left ventricle has normal function. The left ventricle has no regional wall motion abnormalities. The left ventricular internal cavity size was normal in size. There is  no left ventricular hypertrophy. Left ventricular diastolic parameters are indeterminate. Right Ventricle: The right ventricular size is normal. No increase in right ventricular wall thickness. Right ventricular systolic function is normal. There is normal pulmonary artery systolic pressure. The tricuspid regurgitant velocity is 2.43 m/s, and  with an assumed right atrial pressure of 3 mmHg, the estimated right ventricular systolic pressure is 85.0 mmHg. Left Atrium: Left atrial size was mildly dilated. Right Atrium: Right atrial size was normal in size. Pericardium: There is no evidence of pericardial effusion. Mitral Valve: The mitral valve is abnormal. There is mild thickening of the mitral valve leaflet(s). There is mild calcification of the mitral valve leaflet(s). Mild mitral annular calcification. No evidence of mitral valve regurgitation. No evidence of mitral valve stenosis. Tricuspid Valve: The tricuspid valve is normal in structure. Tricuspid valve regurgitation is trivial. No evidence of tricuspid stenosis. Aortic Valve: The aortic valve is normal in structure. There is moderate calcification of the aortic valve. There is moderate thickening of the aortic valve. Aortic valve regurgitation is not visualized. Aortic valve sclerosis/calcification  is present, without any evidence of aortic stenosis. Pulmonic Valve: The pulmonic valve was normal in structure. Pulmonic valve regurgitation is not visualized. No evidence of pulmonic stenosis. Aorta: The aortic root is normal in size and structure. Venous: The inferior vena cava is normal in size with greater than 50% respiratory variability, suggesting right atrial pressure of 3 mmHg. IAS/Shunts: No  atrial level shunt detected by color flow Doppler.  LEFT VENTRICLE PLAX 2D LVIDd:         3.20 cm LVIDs:         2.00 cm LV PW:         1.00 cm LV IVS:        1.00 cm LVOT diam:     1.70 cm LVOT Area:     2.27 cm  IVC IVC diam: 1.40 cm LEFT ATRIUM             Index        RIGHT ATRIUM           Index LA diam:        3.90 cm 1.96 cm/m   RA Area:     16.70 cm LA Vol (A2C):   61.0 ml 30.58 ml/m  RA Volume:   44.30 ml  22.21 ml/m LA Vol (A4C):   69.3 ml 34.74 ml/m LA Biplane Vol: 65.7 ml 32.94 ml/m   AORTA Ao Root diam: 2.90 cm Ao Asc diam:  3.00 cm TRICUSPID VALVE TR Peak grad:   23.6 mmHg TR Vmax:        243.00 cm/s  SHUNTS Systemic Diam: 1.70 cm Jenkins Rouge MD Electronically signed by Jenkins Rouge MD Signature Date/Time: 01/11/2022/8:59:31 AM    Final    CT Angio Chest PE W/Cm &/Or Wo Cm  Result Date: 01/10/2022 CLINICAL DATA:  Right-sided chest pain. EXAM: CT ANGIOGRAPHY CHEST WITH CONTRAST TECHNIQUE: Multidetector CT imaging of the chest was performed using the standard protocol during bolus administration of intravenous contrast. Multiplanar CT image reconstructions and MIPs were obtained to evaluate the vascular anatomy. RADIATION DOSE REDUCTION: This exam was performed according to the departmental dose-optimization program which includes automated exposure control, adjustment of the mA and/or kV according to patient size and/or use of iterative reconstruction technique. CONTRAST:  52m OMNIPAQUE IOHEXOL 350 MG/ML SOLN COMPARISON:  None Available. FINDINGS: Cardiovascular: Aorta is normal in size. Heart is mildly enlarged. There is a small pericardial effusion. There is adequate opacification of the pulmonary arteries to the segmental level. There are atherosclerotic calcifications of the aorta and coronary arteries. Mediastinum/Nodes: There is an enlarged subcarinal lymph node measuring 13 mm short axis. There are nonenlarged, but prominent paratracheal, AP window, prevascular and hilar lymph nodes.  The esophagus and thyroid gland are within normal limits. Lungs/Pleura: There are patchy ground-glass opacities throughout both lungs diffusely. There is atelectasis in the bilateral lower lobes. There is no pleural effusion or pneumothorax identified. Upper Abdomen: No acute abnormality. Musculoskeletal: No chest wall abnormality. No acute or significant osseous findings. Review of the MIP images confirms the above findings. IMPRESSION: 1. No evidence for pulmonary embolism. 2. Mild cardiomegaly with small pericardial effusion. 3. Patchy ground-glass opacities throughout both lungs favored as mild edema. 4. Enlarged subcarinal lymph node. Aortic Atherosclerosis (ICD10-I70.0). Electronically Signed   By: ARonney AstersM.D.   On: 01/10/2022 20:36   DG Chest 2 View  Result Date: 01/10/2022 CLINICAL DATA:  Chest pain EXAM: CHEST - 2 VIEW COMPARISON:  Radiograph 01/10/2022 FINDINGS: Unchanged cardiomediastinal silhouette. Low  lung volumes with bibasilar subsegmental atelectasis. No large pleural effusion. No pneumothorax. No acute osseous abnormality. IMPRESSION: Low lung volumes with bibasilar subsegmental atelectasis. No new airspace disease. Electronically Signed   By: Maurine Simmering M.D.   On: 01/10/2022 16:51    Microbiology: Results for orders placed or performed during the hospital encounter of 01/30/22  SARS Coronavirus 2 by RT PCR (hospital order, performed in [facility] hospital lab) *cepheid single result test*     Status: None   Collection Time: 01/31/22  2:52 AM   Specimen: Nasal Swab  Result Value Ref Range Status   SARS Coronavirus 2 by RT PCR NEGATIVE NEGATIVE Final    Comment: (NOTE) SARS-CoV-2 target nucleic acids are NOT DETECTED.  The SARS-CoV-2 RNA is generally detectable in upper and lower respiratory specimens during the acute phase of infection. The lowest concentration of SARS-CoV-2 viral copies this assay can detect is 250 copies / mL. A negative result does not preclude  SARS-CoV-2 infection and should not be used as the sole basis for treatment or other patient management decisions.  A negative result may occur with improper specimen collection / handling, submission of specimen other than nasopharyngeal swab, presence of viral mutation(s) within the areas targeted by this assay, and inadequate number of viral copies (<250 copies / mL). A negative result must be combined with clinical observations, patient history, and epidemiological information.  Fact Sheet for Patients:   https://www.patel.info/  Fact Sheet for Healthcare Providers: https://hall.com/  This test is not yet approved or  cleared by the [place] FDA and has been authorized for detection and/or diagnosis of SARS-CoV-2 by FDA under an Emergency Use Authorization (EUA).  This EUA will remain in effect (meaning this test can be used) for the duration of the COVID-19 declaration under Section 564(b)(1) of the Act, 21 U.S.C. section 360bbb-3(b)(1), unless the authorization is terminated or revoked sooner.  Performed at [facility], [place] [address], [place], [place] [address]   Respiratory (~20 pathogens) panel by PCR     Status: None   Collection Time: 01/31/22  2:52 AM   Specimen: Respiratory  Result Value Ref Range Status   Adenovirus NOT DETECTED NOT DETECTED Final   Coronavirus 229E NOT DETECTED NOT DETECTED Final    Comment: (NOTE) The Coronavirus on the Respiratory Panel, DOES NOT test for the novel  Coronavirus (2019 nCoV)    Coronavirus HKU1 NOT DETECTED NOT DETECTED Final   Coronavirus NL63 NOT DETECTED NOT DETECTED Final   Coronavirus OC43 NOT DETECTED NOT DETECTED Final   Metapneumovirus NOT DETECTED NOT DETECTED Final   Rhinovirus / Enterovirus NOT DETECTED NOT DETECTED Final   Influenza A NOT DETECTED NOT DETECTED Final   Influenza B NOT DETECTED NOT DETECTED Final   Parainfluenza Virus 1 NOT DETECTED NOT DETECTED  Final   Parainfluenza Virus 2 NOT DETECTED NOT DETECTED Final   Parainfluenza Virus 3 NOT DETECTED NOT DETECTED Final   Parainfluenza Virus 4 NOT DETECTED NOT DETECTED Final   Respiratory Syncytial Virus NOT DETECTED NOT DETECTED Final   Bordetella pertussis NOT DETECTED NOT DETECTED Final   Bordetella Parapertussis NOT DETECTED NOT DETECTED Final   Chlamydophila pneumoniae NOT DETECTED NOT DETECTED Final   Mycoplasma pneumoniae NOT DETECTED NOT DETECTED Final    Comment: Performed at [facility], [place]. [address] [place], [place] [address]    Labs: CBC: Recent Labs  Lab 02/01/22 0320 02/02/22 0307 02/03/22 0446 02/04/22 0528 02/06/22 0128  WBC 9.6 9.6  8.5 8.2 8.0  NEUTROABS 7.1 7.4 6.0 5.5  --   HGB 11.5* 11.2* 12.1 12.2 11.8*  HCT 35.3* 34.7* 36.9 36.9 36.6  MCV 86.5 87.0 85.0 85.0 86.3  PLT 420* 403* 401* 431* 330*   Basic Metabolic Panel: Recent Labs  Lab 02/03/22 0446 02/04/22 0528 02/05/22 0127 02/06/22 0128 02/07/22 0144  NA 137 137 134* 134* 134*  K 3.5 4.0 3.6 3.9 4.0  CL 98 99 95* 97* 98  CO2 '30 28 26 30 28  '$ GLUCOSE 123* 114* 105* 107* 118*  BUN '12 14 18 16 16  '$ CREATININE 0.59 0.70 0.64 0.74 0.79  CALCIUM 8.7* 8.9 8.8* 8.7* 8.9  MG 2.1 2.1 2.0 2.4 2.2   Liver Function Tests: Recent Labs  Lab 02/01/22 0320 02/02/22 0307 02/03/22 0446 02/04/22 0528  AST 31 39 36 37  ALT 48* 51* 48* 50*  ALKPHOS 88 88 94 92  BILITOT 0.6 0.9 0.8 1.0  PROT 5.8* 5.9* 5.9* 6.2*  ALBUMIN 2.6* 2.7* 2.6* 2.8*   CBG: No results for input(s): "GLUCAP" in the last 168 hours.  Discharge time spent: greater than 30 minutes.  Signed: Tawni Millers, MD [facility] 02/07/2022

## 2022-02-07 NOTE — Progress Notes (Signed)
Electrophysiology Rounding Note  Patient Name: Dorothy Powers Date of Encounter: 02/07/2022  Primary Cardiologist: Freada Bergeron, MD Electrophysiologist: Dr. Quentin Ore   Subjective   NAEO.   Has many questions about diet. Asked dietician to see  Inpatient Medications    Scheduled Meds:  amiodarone  200 mg Oral BID   apixaban  5 mg Oral BID   diltiazem  300 mg Oral Daily   ezetimibe  10 mg Oral Daily   furosemide  40 mg Oral Daily   pantoprazole  40 mg Oral Daily   rosuvastatin  10 mg Oral Daily   sodium chloride flush  3 mL Intravenous Q12H   Continuous Infusions:  sodium chloride     PRN Meds: sodium chloride, acetaminophen **OR** acetaminophen, alum & mag hydroxide-simeth, HYDROcodone-acetaminophen, mouth rinse, sodium chloride flush   Vital Signs    Vitals:   02/06/22 0915 02/06/22 1225 02/06/22 1924 02/07/22 0451  BP: 136/70 135/70 134/69 133/74  Pulse:  100 98 96  Resp:  '16 18 18  '$ Temp:  98.8 F (37.1 C) 98.4 F (36.9 C) 98.2 F (36.8 C)  TempSrc:  Oral Oral Oral  SpO2:  95% 94% 95%  Weight:    93.4 kg  Height:        Intake/Output Summary (Last 24 hours) at 02/07/2022 0812 Last data filed at 02/07/2022 0455 Gross per 24 hour  Intake 603 ml  Output 1800 ml  Net -1197 ml   Filed Weights   02/05/22 0038 02/06/22 0411 02/07/22 0451  Weight: 94.1 kg 93.6 kg 93.4 kg    Physical Exam    GEN- The patient is well appearing, alert and oriented x 3 today.   Head- normocephalic, atraumatic Eyes-  Sclera clear, conjunctiva pink Ears- hearing intact Oropharynx- clear Neck- supple Lungs- Clear to ausculation bilaterally, normal work of breathing Heart- Regular rate and rhythm, no murmurs, rubs or gallops GI- soft, NT, ND, + BS Extremities- no clubbing or cyanosis. No edema Skin- no rash or lesion Psych- euthymic mood, full affect Neuro- strength and sensation are intact  Labs    CBC Recent Labs    02/06/22 0128  WBC 8.0  HGB 11.8*   HCT 36.6  MCV 86.3  PLT 381*   Basic Metabolic Panel Recent Labs    02/06/22 0128 02/07/22 0144  NA 134* 134*  K 3.9 4.0  CL 97* 98  CO2 30 28  GLUCOSE 107* 118*  BUN 16 16  CREATININE 0.74 0.79  CALCIUM 8.7* 8.9  MG 2.4 2.2   Liver Function Tests No results for input(s): "AST", "ALT", "ALKPHOS", "BILITOT", "PROT", "ALBUMIN" in the last 72 hours. No results for input(s): "LIPASE", "AMYLASE" in the last 72 hours. Cardiac Enzymes No results for input(s): "CKTOTAL", "CKMB", "CKMBINDEX", "TROPONINI" in the last 72 hours.   Telemetry    NSR 90-100s, PACs (personally reviewed)  Radiology    No results found.  Patient Profile     Dorothy Powers is a 76 y.o. female with a past medical history significant for persistent atrial fibrillation. EP was consulted and decided on Tikosyn load. Unfortunately she had significant QT prolongation, and this was stopped.     Assessment & Plan    Persistent atrial fibrillation Continue Eliquis QT prolonged on Tikosyn and was thus stopped. Continue amiodarone 200 mg BID.  Poor candidate for ablation given body habitus.    2 HFpEF Continue lasix 40 mg daily and continue to assess need.  May need less/less often  maintaining NSR.   Dr. Quentin Ore has seen. OK for d/c on amio 200 mg BID with close follow up (arranged) .  She has many questions about diet and salt intake. I have asked dietician to see.   For questions or updates, please contact [place] Please consult [email or web address] for contact info under Cardiology/STEMI.  Signed, Shirley Friar, PA-C  02/07/2022, 8:12 AM

## 2022-02-07 NOTE — Care Management Important Message (Signed)
Important Message  Patient Details  Name: Dorothy Powers MRN: [id number] Date of Birth: April 05, 1946   Medicare Important Message Given:  Yes     Shelda Altes 02/07/2022, 11:57 AM

## 2022-02-07 NOTE — Progress Notes (Signed)
Mobility Specialist Progress Note:   02/07/22 1202  Mobility  Activity Ambulated with assistance in hallway  Level of Assistance Independent  Assistive Device None  Distance Ambulated (ft) 300 ft  Activity Response Tolerated well  $Mobility charge 1 Mobility   Pt received up at sink willing to participate in mobility. No complaints of pain. Left in chair with call bell in reach and all needs met.   [place] Mobility Specialist  

## 2022-02-07 NOTE — Progress Notes (Signed)
Dorothy Powers to be D/C'd home per MD order. Discussed with the patient and all questions fully answered.  Skin clean, dry and intact without evidence of skin break down, no evidence of skin tears noted.  IV catheter discontinued intact. Site without signs and symptoms of complications. Dressing and pressure applied.  An After Visit Summary was printed and given to the patient.  Patient escorted via [place], and D/C home via private auto.  [name]  02/07/2022 3:46 PM

## 2022-02-07 NOTE — Progress Notes (Signed)
Nutrition Brief Note  RD re-consulted for nutrition education regarding new onset CHF as pt had more questions following initial diet education on 7/20.  Addressed all of pt's questions regarding foods containing salt and how to reduce salt in meals that she usually eats. Previous RD provided "Heart Healthy Nutrition Therapy" handout for pt. Provided another copy for review in AVS.  Reiterated importance of adherence to diet recommendations , role of fluids and foods to avoid as well as importance of monitoring daily weights.   Expect good compliance.  Body mass index is 36.46 kg/m. Pt meets criteria for obesity based on current BMI.  Current diet order is Heart Healthy, patient is consuming approximately 90-100% of meals at this time. If additional nutrition issues arise prior to discharge, please re-consult RD.   [name], RDN, LDN Clinical Nutrition

## 2022-02-07 NOTE — Progress Notes (Signed)
OT Cancellation Note  Patient Details Name: Dorothy Powers MRN: [id number] DOB: 1946-05-19   Cancelled Treatment:    Reason Eval/Treat Not Completed: OT evaluation completed 7/20. No needs identified at that time at OT signed off. New orders placed 7/26. Ordering MD contacted via secure chat. Reports no change in patient status. Patient continues to work with PT. OT screened, no needs identified at this time, will sign off.  Gloris Manchester OTR/L Supplemental OT, Department of rehab services [phone]  Sammi Stolarz R H. 02/07/2022, 9:52 AM

## 2022-02-12 ENCOUNTER — Ambulatory Visit (HOSPITAL_COMMUNITY): Payer: Medicare Other | Admitting: Nurse Practitioner

## 2022-02-18 NOTE — Progress Notes (Addendum)
PCP:  Orpah Melter, MD Primary Cardiologist: Freada Bergeron, MD Electrophysiologist: Vickie Epley, MD   Dorothy Powers is a 76 y.o. female seen today for Vickie Epley, MD for post hospital follow up.   Pt was admitted 7/19 - 7/27 with AF RVR and acute on chronic diastolic CHF. She was tried on tikosyn which resulted in QT prolongation and was transitioned to amiodarone.   Since discharge she has been feeling very well. She had mild edema in the evening last night, but this was after eating out twice and playing cards after church. Otherwise, she denies symptoms of palpitations, chest pain, shortness of breath, orthopnea, PND, lower extremity edema, claudication, dizziness, presyncope, syncope, bleeding, or neurologic sequela. The patient is tolerating medications without difficulties. .   Past Medical History:  Diagnosis Date   Hypertension    Meningioma ([place])    Tachycardia    TIA (transient ischemic attack)    Past Surgical History:  Procedure Laterality Date   ABDOMINAL HYSTERECTOMY     BUNIONECTOMY Right 11/2020   CARDIOVERSION N/A 02/01/2022   Procedure: CARDIOVERSION;  Surgeon: Skeet Latch, MD;  Location: [facility];  Service: Cardiovascular;  Laterality: N/A;    Current Outpatient Medications  Medication Sig Dispense Refill   albuterol (VENTOLIN HFA) 108 (90 Base) MCG/ACT inhaler Inhale 1-2 puffs into the lungs every 6 (six) hours as needed for wheezing.     amiodarone (PACERONE) 200 MG tablet Take 1 tablet (200 mg total) by mouth 2 (two) times daily. 60 tablet 0   apixaban (ELIQUIS) 5 MG TABS tablet Take 1 tablet (5 mg total) by mouth 2 (two) times daily. 60 tablet 3   Ascorbic Acid (VITAMIN C PO) Take 1 tablet by mouth daily.     azelastine (ASTELIN) 0.1 % nasal spray Place 1 spray into both nostrils 2 (two) times daily.     CALCIUM-VITAMIN D PO Take 2 tablets by mouth at bedtime.     cetirizine (ZYRTEC) 10 MG tablet Take 10 mg by mouth  daily.     Cholecalciferol (VITAMIN D3) 125 MCG (5000 UT) TABS Take 5,000 Units by mouth daily.     Coenzyme Q10 (CO Q 10 PO) Take 200 mg by mouth daily.     diltiazem (CARDIZEM CD) 300 MG 24 hr capsule Take 1 capsule (300 mg total) by mouth daily. 30 capsule 0   diphenhydrAMINE (BENADRYL) 25 MG tablet Take 25-50 mg by mouth 2 (two) times daily as needed for allergies.     ezetimibe (ZETIA) 10 MG tablet Take 10 mg by mouth daily.     furosemide (LASIX) 40 MG tablet Take 1 tablet (40 mg total) by mouth daily. 30 tablet 0   glucosamine-chondroitin 500-400 MG tablet Take 1 tablet by mouth in the morning and at bedtime. Triple strength     pantoprazole (PROTONIX) 40 MG tablet Take 40 mg by mouth daily.     Polyethyl Glycol-Propyl Glycol (SYSTANE OP) Place 1 drop into both eyes daily as needed (allergies).     potassium chloride SA (KLOR-CON M) 20 MEQ tablet Take 1 tablet (20 mEq total) by mouth daily. 30 tablet 0   rosuvastatin (CRESTOR) 10 MG tablet Take 10 mg by mouth daily.     No current facility-administered medications for this visit.    Allergies  Allergen Reactions   Codeine Other (See Comments)    "Couldn't move one night for a short time, after taking the medicaiton"   Lovastatin Other (See Comments)  Leg pain   Sulfa Antibiotics Other (See Comments)    "Felt weak"    Social History   Socioeconomic History   Marital status: Married    Spouse name: Not on file   Number of children: 0   Years of education: college   Highest education level: Bachelor's degree (e.g., BA, AB, BS)  Occupational History   Occupation: Retired  Tobacco Use   Smoking status: Former    Types: Cigarettes   Smokeless tobacco: Never  Substance and Sexual Activity   Alcohol use: Yes    Comment: social   Drug use: Never   Sexual activity: Not on file  Other Topics Concern   Not on file  Social History Narrative   Lives at home with husband.   Right-handed.   No daily use of caffeine.    Social Determinants of Health   Financial Resource Strain: Not on file  Food Insecurity: Not on file  Transportation Needs: Not on file  Physical Activity: Not on file  Stress: Not on file  Social Connections: Not on file  Intimate Partner Violence: Not on file     Review of Systems: All other systems reviewed and are otherwise negative except as noted above.  Physical Exam: Vitals:   02/25/22 0829  BP: 124/64  Pulse: 78  SpO2: 95%  Weight: 204 lb (92.5 kg)  Height: '5\' 3"'$  (1.6 m)    GEN- The patient is well appearing, alert and oriented x 3 today.   HEENT: normocephalic, atraumatic; sclera clear, conjunctiva pink; hearing intact; oropharynx clear; neck supple, no JVP Lymph- no cervical lymphadenopathy Lungs- Clear to ausculation bilaterally, normal work of breathing.  No wheezes, rales, rhonchi Heart- Regular rate and rhythm, no murmurs, rubs or gallops, PMI not laterally displaced GI- soft, non-tender, non-distended, bowel sounds present, no hepatosplenomegaly Extremities- no clubbing, cyanosis, or edema; DP/PT/radial pulses 2+ bilaterally MS- no significant deformity or atrophy Skin- warm and dry, no rash or lesion Psych- euthymic mood, full affect Neuro- strength and sensation are intact  EKG is ordered. Personal review of EKG from today shows NSR at 79 bpm  Additional studies reviewed include: Previous EP office notes.   Assessment and Plan: Persistent atrial fibrillation EKG today shows NSR Continue Eliquis QT prolonged on Tikosyn and was thus stopped. Decrease amiodarone to 200 mg daily.  Poor candidate for ablation given body habitus.    2. HFpEF Volume status stable on exam today.  Continue lasix 40 mg daily and continue to assess need.  Labs today.   Follow up with Dr. Quentin Ore in 3 months   Shirley Friar, PA-C  02/18/22 9:20 AM

## 2022-02-19 DIAGNOSIS — Z09 Encounter for follow-up examination after completed treatment for conditions other than malignant neoplasm: Secondary | ICD-10-CM | POA: Diagnosis not present

## 2022-02-19 DIAGNOSIS — I480 Paroxysmal atrial fibrillation: Secondary | ICD-10-CM | POA: Diagnosis not present

## 2022-02-25 ENCOUNTER — Ambulatory Visit (INDEPENDENT_AMBULATORY_CARE_PROVIDER_SITE_OTHER): Payer: Medicare Other | Admitting: Student

## 2022-02-25 ENCOUNTER — Encounter: Payer: Self-pay | Admitting: Student

## 2022-02-25 ENCOUNTER — Other Ambulatory Visit: Payer: Self-pay

## 2022-02-25 VITALS — BP 124/64 | HR 78 | Ht 63.0 in | Wt 204.0 lb

## 2022-02-25 DIAGNOSIS — I4891 Unspecified atrial fibrillation: Secondary | ICD-10-CM | POA: Diagnosis not present

## 2022-02-25 DIAGNOSIS — Z79899 Other long term (current) drug therapy: Secondary | ICD-10-CM

## 2022-02-25 DIAGNOSIS — I5032 Chronic diastolic (congestive) heart failure: Secondary | ICD-10-CM

## 2022-02-25 LAB — COMPREHENSIVE METABOLIC PANEL
ALT: 21 IU/L (ref 0–32)
AST: 26 IU/L (ref 0–40)
Albumin/Globulin Ratio: 1.8 (ref 1.2–2.2)
Albumin: 4.4 g/dL (ref 3.8–4.8)
Alkaline Phosphatase: 116 IU/L (ref 44–121)
BUN/Creatinine Ratio: 22 (ref 12–28)
BUN: 19 mg/dL (ref 8–27)
Bilirubin Total: 0.5 mg/dL (ref 0.0–1.2)
CO2: 26 mmol/L (ref 20–29)
Calcium: 9.8 mg/dL (ref 8.7–10.3)
Chloride: 95 mmol/L — ABNORMAL LOW (ref 96–106)
Creatinine, Ser: 0.86 mg/dL (ref 0.57–1.00)
Globulin, Total: 2.5 g/dL (ref 1.5–4.5)
Glucose: 107 mg/dL — ABNORMAL HIGH (ref 70–99)
Potassium: 4.3 mmol/L (ref 3.5–5.2)
Sodium: 134 mmol/L (ref 134–144)
Total Protein: 6.9 g/dL (ref 6.0–8.5)
eGFR: 69 mL/min/{1.73_m2} (ref >59)

## 2022-02-25 LAB — T4, FREE: Free T4: 1.35 ng/dL (ref 0.82–1.77)

## 2022-02-25 LAB — TSH: TSH: 4.07 u[IU]/mL (ref 0.450–4.500)

## 2022-02-25 MED ORDER — DILTIAZEM HCL ER COATED BEADS 300 MG PO CP24: 300.0000 mg | ORAL_CAPSULE | Freq: Every day | ORAL | 3 refills | Status: DC

## 2022-02-25 MED ORDER — APIXABAN 5 MG PO TABS: 5.0000 mg | ORAL_TABLET | Freq: Two times a day (BID) | ORAL | 3 refills | Status: DC

## 2022-02-25 MED ORDER — POTASSIUM CHLORIDE CRYS ER 20 MEQ PO TBCR: 20.0000 meq | EXTENDED_RELEASE_TABLET | Freq: Every day | ORAL | 3 refills | Status: DC

## 2022-02-25 MED ORDER — FUROSEMIDE 40 MG PO TABS: 40.0000 mg | ORAL_TABLET | Freq: Every day | ORAL | 3 refills | Status: DC

## 2022-02-25 MED ORDER — AMIODARONE HCL 200 MG PO TABS: 200.0000 mg | ORAL_TABLET | Freq: Every day | ORAL | 3 refills | Status: DC

## 2022-02-25 NOTE — Patient Instructions (Signed)
Medication Instructions:  Your physician has recommended you make the following change in your medication:   DECREASE: Amiodarone to '200mg'$  daily  *If you need a refill on your cardiac medications before your next appointment, please call your pharmacy*   Lab Work: TODAY: CMET, TSH, FreeT4  If you have labs (blood work) drawn today and your tests are completely normal, you will receive your results only by: [place] (if you have MyChart) OR A paper copy in the mail If you have any lab test that is abnormal or we need to change your treatment, we will call you to review the results.   Follow-Up: At [facility], you and your health needs are our priority.  As part of our continuing mission to provide you with exceptional heart care, we have created designated Provider Care Teams.  These Care Teams include your primary Cardiologist (physician) and Advanced Practice Providers (APPs -  Physician Assistants and Nurse Practitioners) who all work together to provide you with the care you need, when you need it.  Your next appointment:   3 month(s)  The format for your next appointment:   In Person  Provider:   Lars Mage, MD

## 2022-03-05 DIAGNOSIS — D496 Neoplasm of unspecified behavior of brain: Secondary | ICD-10-CM | POA: Diagnosis not present

## 2022-03-05 DIAGNOSIS — Z6836 Body mass index (BMI) 36.0-36.9, adult: Secondary | ICD-10-CM | POA: Diagnosis not present

## 2022-03-12 DIAGNOSIS — I4891 Unspecified atrial fibrillation: Secondary | ICD-10-CM | POA: Diagnosis not present

## 2022-03-12 DIAGNOSIS — K219 Gastro-esophageal reflux disease without esophagitis: Secondary | ICD-10-CM | POA: Diagnosis not present

## 2022-03-12 DIAGNOSIS — I10 Essential (primary) hypertension: Secondary | ICD-10-CM | POA: Diagnosis not present

## 2022-03-12 DIAGNOSIS — E7800 Pure hypercholesterolemia, unspecified: Secondary | ICD-10-CM | POA: Diagnosis not present

## 2022-04-18 ENCOUNTER — Encounter: Payer: Self-pay | Admitting: Neurology

## 2022-04-18 ENCOUNTER — Ambulatory Visit (INDEPENDENT_AMBULATORY_CARE_PROVIDER_SITE_OTHER): Payer: Medicare Other | Admitting: Neurology

## 2022-04-18 VITALS — BP 125/77 | HR 75 | Ht 63.0 in | Wt 196.0 lb

## 2022-04-18 DIAGNOSIS — Z8673 Personal history of transient ischemic attack (TIA), and cerebral infarction without residual deficits: Secondary | ICD-10-CM | POA: Diagnosis not present

## 2022-04-18 DIAGNOSIS — G250 Essential tremor: Secondary | ICD-10-CM

## 2022-04-18 DIAGNOSIS — D329 Benign neoplasm of meninges, unspecified: Secondary | ICD-10-CM | POA: Diagnosis not present

## 2022-04-18 NOTE — Progress Notes (Signed)
[facility]  PATIENT: Dorothy Powers DOB: 12-04-1945  REFERRING CLINICIAN: Orpah Melter, MD HISTORY FROM: Patient  REASON FOR VISIT: TIA    HISTORICAL  CHIEF COMPLAINT:  Chief Complaint  Patient presents with   Follow-up    Rm 15 alone here for yearly f/u- pt reports over all doing ok. Since last f/u noticeable tremors in both right and left hand (sx are intermittent).     INTERVAL HISTORY 04/18/22 Patient presents today for follow-up, last visit was last year in September.  Since then, she has repeated her MRI brain which showed stable meningioma.  She reports in the month of July she was found to have new A-fib, she is on Eliquis now, Plavix is discontinued.  So far doing well,she continues to exercise.  She has noted a minor tremor in her hands, said that both her father and sister have tremors.  Denies any recent falls    HISTORY OF PRESENT ILLNESS:  This is a 76 year old woman with past medical history of hypertension and hyperlipidemia who is presenting following a hospital stay for TIA.  Patient said that she presented to the hospital after 3 episodes of left leg and foot numbness.  Initially the episodes lasted less than a minute, she got concerned and presented to the hospital.  In the hospital she also reported having 2 additional episodes with 1 lasting longer.  She had a full stroke work-up including MRI which was negative for any acute stroke but showed a incidental left frontal meningioma, Her MRI showed significant vascular disease in the intracranial vessel, and her echocardiogram did not show any significant abnormality.  Her stroke labs including a lipid panel showed a LDL of 125 and a hemoglobin A1c of 6.0. She was discharged with DAPT for 3 months and to continue with Plavix thereafter.  Since being discharged from the hospital, patient denies any additional episodes of numbness, no weakness no other complaint. For her meningioma she did  follow with neurosurgery and they plan to repeat the MRI in 6 months.  She denies any symptoms involving the right side, denies any abnormal smell or taste, denies any feeling of fear or doom, denies any personality change, and denies any weakness or tingling of the right side of the body.    OTHER MEDICAL CONDITIONS: HLD, HTN, left frontal meningioma   REVIEW OF SYSTEMS: Full 14 system review of systems performed and negative with exception of: as noted in the HPI  ALLERGIES: Allergies  Allergen Reactions   Codeine Other (See Comments)    "Couldn't move one night for a short time, after taking the medicaiton"   Lovastatin Other (See Comments)    Leg pain   Sulfa Antibiotics Other (See Comments)    "Felt weak"    HOME MEDICATIONS: Outpatient Medications Prior to Visit  Medication Sig Dispense Refill   albuterol (VENTOLIN HFA) 108 (90 Base) MCG/ACT inhaler Inhale 1-2 puffs into the lungs every 6 (six) hours as needed for wheezing.     amiodarone (PACERONE) 200 MG tablet Take 1 tablet (200 mg total) by mouth daily. 90 tablet 3   apixaban (ELIQUIS) 5 MG TABS tablet Take 1 tablet (5 mg total) by mouth 2 (two) times daily. 180 tablet 3   Ascorbic Acid (VITAMIN C PO) Take 1 tablet by mouth daily.     azelastine (ASTELIN) 0.1 % nasal spray Place 1 spray into both nostrils 2 (two) times daily.     CALCIUM-VITAMIN D PO Take 2 tablets  by mouth at bedtime.     cetirizine (ZYRTEC) 10 MG tablet Take 10 mg by mouth daily.     Cholecalciferol (VITAMIN D3) 125 MCG (5000 UT) TABS Take 5,000 Units by mouth daily.     Coenzyme Q10 (CO Q 10 PO) Take 200 mg by mouth daily.     diltiazem (CARDIZEM CD) 300 MG 24 hr capsule Take 1 capsule (300 mg total) by mouth daily. 90 capsule 3   diphenhydrAMINE (BENADRYL) 25 MG tablet Take 25-50 mg by mouth 2 (two) times daily as needed for allergies.     DOXYCYCLINE PO Take 50 mg by mouth daily.     ezetimibe (ZETIA) 10 MG tablet Take 10 mg by mouth daily.      furosemide (LASIX) 40 MG tablet Take 1 tablet (40 mg total) by mouth daily. 90 tablet 3   glucosamine-chondroitin 500-400 MG tablet Take 1 tablet by mouth in the morning and at bedtime. Triple strength     pantoprazole (PROTONIX) 40 MG tablet Take 40 mg by mouth daily.     Polyethyl Glycol-Propyl Glycol (SYSTANE OP) Place 1 drop into both eyes daily as needed (allergies).     potassium chloride SA (KLOR-CON M) 20 MEQ tablet Take 1 tablet (20 mEq total) by mouth daily. 90 tablet 3   rosuvastatin (CRESTOR) 10 MG tablet Take 10 mg by mouth daily.     No facility-administered medications prior to visit.    PAST MEDICAL HISTORY: Past Medical History:  Diagnosis Date   Hypertension    Meningioma ([place])    Tachycardia    TIA (transient ischemic attack)     PAST SURGICAL HISTORY: Past Surgical History:  Procedure Laterality Date   ABDOMINAL HYSTERECTOMY     BUNIONECTOMY Right 11/2020   CARDIOVERSION N/A 02/01/2022   Procedure: CARDIOVERSION;  Surgeon: Skeet Latch, MD;  Location: [facility] ENDOSCOPY;  Service: Cardiovascular;  Laterality: N/A;    FAMILY HISTORY: Family History  Problem Relation Age of Onset   Heart disease Mother        pacemaker   Heart attack Father     SOCIAL HISTORY: Social History   Socioeconomic History   Marital status: Married    Spouse name: Not on file   Number of children: 0   Years of education: college   Highest education level: Bachelor's degree (e.g., BA, AB, BS)  Occupational History   Occupation: Retired  Tobacco Use   Smoking status: Former    Types: Cigarettes   Smokeless tobacco: Never  Substance and Sexual Activity   Alcohol use: Yes    Comment: social   Drug use: Never   Sexual activity: Not on file  Other Topics Concern   Not on file  Social History Narrative   Lives at home with husband.   Right-handed.   No daily use of caffeine.   Social Determinants of Health   Financial Resource Strain: Not on file  Food Insecurity:  Not on file  Transportation Needs: Not on file  Physical Activity: Not on file  Stress: Not on file  Social Connections: Not on file  Intimate Partner Violence: Not on file     PHYSICAL EXAM  GENERAL EXAM/CONSTITUTIONAL: Vitals:  Vitals:   04/18/22 1341  BP: 125/77  Pulse: 75  Weight: 196 lb (88.9 kg)  Height: '5\' 3"'$  (1.6 m)   Body mass index is 34.72 kg/m. Wt Readings from Last 3 Encounters:  04/18/22 196 lb (88.9 kg)  02/25/22 204 lb (92.5 kg)  02/07/22  205 lb 12.8 oz (93.4 kg)   Patient is in no distress; well developed, nourished and groomed; neck is supple  EYES: Pupils round and reactive to light, Visual fields full to confrontation, Extraocular movements intacts,   MUSCULOSKELETAL: Gait, strength, tone, movements noted in Neurologic exam below  NEUROLOGIC: MENTAL STATUS:      No data to display         awake, alert, oriented to person, place and time recent and remote memory intact normal attention and concentration language fluent, comprehension intact, naming intact fund of knowledge appropriate  CRANIAL NERVE:  2nd, 3rd, 4th, 6th - pupils equal and reactive to light, visual fields full to confrontation, extraocular muscles intact, no nystagmus 5th - facial sensation symmetric 7th - facial strength symmetric 8th - hearing intact 9th - palate elevates symmetrically, uvula midline 11th - shoulder shrug symmetric 12th - tongue protrusion midline  MOTOR:  normal bulk and tone, full strength in the BUE, BLE  SENSORY:  normal and symmetric to light touch, pinprick, temperature, vibration  COORDINATION:  There is very mild action tremors, left greater than right.   REFLEXES:  deep tendon reflexes present and symmetric  GAIT/STATION:  normal    DIAGNOSTIC DATA (LABS, IMAGING, TESTING) - I reviewed patient records, labs, notes, testing and imaging myself where available.  Lab Results  Component Value Date   WBC 8.0 02/06/2022   HGB 11.8  (L) 02/06/2022   HCT 36.6 02/06/2022   MCV 86.3 02/06/2022   PLT 422 (H) 02/06/2022      Component Value Date/Time   NA 134 02/25/2022 0907   K 4.3 02/25/2022 0907   CL 95 (L) 02/25/2022 0907   CO2 26 02/25/2022 0907   GLUCOSE 107 (H) 02/25/2022 0907   GLUCOSE 118 (H) 02/07/2022 0144   BUN 19 02/25/2022 0907   CREATININE 0.86 02/25/2022 0907   CALCIUM 9.8 02/25/2022 0907   PROT 6.9 02/25/2022 0907   ALBUMIN 4.4 02/25/2022 0907   AST 26 02/25/2022 0907   ALT 21 02/25/2022 0907   ALKPHOS 116 02/25/2022 0907   BILITOT 0.5 02/25/2022 0907   GFRNONAA >60 02/07/2022 0144   Lab Results  Component Value Date   CHOL 203 (H) 02/14/2021   HDL 67 02/14/2021   LDLCALC 125 (H) 02/14/2021   TRIG 55 02/14/2021   CHOLHDL 3.0 02/14/2021   Lab Results  Component Value Date   HGBA1C 6.0 (H) 02/14/2021   No results found for: "VITAMINB12" Lab Results  Component Value Date   TSH 4.070 02/25/2022     Head CT 02/14/21 1. No acute intracranial abnormality. 2. ASPECTS is 10. 3. Calcified meningioma at the anterior left convexity without associated edema within the underlying brain.   MRI Brain 1. No evidence of acute infarction. 2. Redemonstrated 3.7 x 3.8 cm calcified meningioma overlying the lateral left frontal lobe. Mass effect upon the underlying left frontal lobe without significant underlying parenchymal edema. No midline shift. 3. Mild generalized cerebral atrophy. 4. Indeterminate 5 mm enhancing lesion within the right parietal calvarium. 40-monthMRI follow-up is recommended to ensure stability and exclude worrisome etiologies (such as osseous metastatic disease). 5. Incompletely assessed cervical spondylosis. 6. C3-C4 and C4-C5 grade 1 anterolisthesis.   MRI Brain 08/25/21 Dural-based enhancing lesion along the left frontal convexity presently measures 3.5 x 3.1 x 3.7 cm (previously 3.6 x 3.1 x 3.7 cm). As before there is displacement of underlying brain parenchyma without  significant underlying edema    MRA head: 1. No  intracranial large vessel occlusion. 2. Intracranial atherosclerotic disease with multifocal stenoses, most notably as follows. 3. Mild-to-moderate stenosis within the pre-cavernous/cavernous right ICA. 4. Severe stenosis within the left posterior cerebral artery at the P3/P4 junction. 5. Severe stenosis within the P4 segment of the right posterior cerebral artery. 6. 1-2 mm inferiorly projecting vascular protrusion arising from the cavernous right ICA, likely reflecting an aneurysm. 7. 1-2 mm inferiorly projecting vascular protrusions arising from the paraclinoid internal carotid arteries bilaterally, which may reflect aneurysms or infundibula.    MRA neck: 1. Common carotid and internal carotid arteries patent within the neck without hemodynamically significant stenosis. Mild atherosclerotic plaque about the right carotid bifurcation. 2. Vertebral arteries patent within the neck with antegrade flow. Apparent moderate stenosis at the origin of the left vertebral artery.   Echo 02/15/21 Left Ventricle: Left ventricular ejection fraction, by estimation, is 60 to 65%. The left ventricle has normal function. The left ventricle has no regional wall motion abnormalities. The left ventricular internal cavity size was normal in size. There is mild left ventricular hypertrophy. Left ventricular diastolic parameters were normal   EEG 02/14/2021 This study is suggestive of cortical dysfunction arising from bitemporal region, nonspecific etiology. No seizures or definite epileptiform discharges were seen throughout the recording.    ASSESSMENT AND PLAN  76 y.o. year old female with vascular risk factors including hypertension and hyperlipidemia who is presented for follow up for TIA and meningioma.  In terms of the TIA, she denies any recurrent symptoms.  She was found to have new atrial fibrillation, Plavix discontinued and now she is on Eliquis.  In  term of the meningioma, she had repeat MRI in February which was stable.  She is planning to have a repeat MRI February next year. I am concerned patient has a beginning of essential tremor, she was found to have mild action tremor on exam L>R.  The tremor is not affecting her currently.  Advised her to contact us sooner if she has any difficulty with writing or any difficulty with eating or brushing the teeth.  She voices understanding.   1. Meningioma ([place])   2. Essential tremor   3. History of TIA (transient ischemic attack)      Patient Instructions  Continue with Eliquis for secondary stroke prevention Continue to follow with neurosurgery, plan for repeat MRI next year in February Contact us sooner if you have worsening of the tremor including difficulty with writing, eating or brushing her teeth.  Return as needed.   No orders of the defined types were placed in this encounter.    No orders of the defined types were placed in this encounter.    Return if symptoms worsen or fail to improve.    Alric Ran, MD 04/18/2022, 2:10 PM  [facility] [address], [place], [place] [address] [phone]

## 2022-04-18 NOTE — Patient Instructions (Signed)
Continue with Eliquis for secondary stroke prevention Continue to follow with neurosurgery, plan for repeat MRI next year in February Contact us sooner if you have worsening of the tremor including difficulty with writing, eating or brushing her teeth.  Return as needed.

## 2022-05-01 DIAGNOSIS — E7800 Pure hypercholesterolemia, unspecified: Secondary | ICD-10-CM | POA: Diagnosis not present

## 2022-05-01 DIAGNOSIS — Z23 Encounter for immunization: Secondary | ICD-10-CM | POA: Diagnosis not present

## 2022-05-01 DIAGNOSIS — K219 Gastro-esophageal reflux disease without esophagitis: Secondary | ICD-10-CM | POA: Diagnosis not present

## 2022-05-01 DIAGNOSIS — M1990 Unspecified osteoarthritis, unspecified site: Secondary | ICD-10-CM | POA: Diagnosis not present

## 2022-05-01 DIAGNOSIS — I10 Essential (primary) hypertension: Secondary | ICD-10-CM | POA: Diagnosis not present

## 2022-05-15 DIAGNOSIS — Z961 Presence of intraocular lens: Secondary | ICD-10-CM | POA: Diagnosis not present

## 2022-05-15 DIAGNOSIS — H04123 Dry eye syndrome of bilateral lacrimal glands: Secondary | ICD-10-CM | POA: Diagnosis not present

## 2022-05-15 DIAGNOSIS — H43811 Vitreous degeneration, right eye: Secondary | ICD-10-CM | POA: Diagnosis not present

## 2022-05-15 DIAGNOSIS — H02831 Dermatochalasis of right upper eyelid: Secondary | ICD-10-CM | POA: Diagnosis not present

## 2022-05-15 DIAGNOSIS — H1045 Other chronic allergic conjunctivitis: Secondary | ICD-10-CM | POA: Diagnosis not present

## 2022-05-29 ENCOUNTER — Encounter: Payer: Self-pay | Admitting: Podiatry

## 2022-05-29 ENCOUNTER — Ambulatory Visit (INDEPENDENT_AMBULATORY_CARE_PROVIDER_SITE_OTHER): Payer: Medicare Other | Admitting: Podiatry

## 2022-05-29 DIAGNOSIS — D2371 Other benign neoplasm of skin of right lower limb, including hip: Secondary | ICD-10-CM

## 2022-05-29 DIAGNOSIS — M778 Other enthesopathies, not elsewhere classified: Secondary | ICD-10-CM

## 2022-05-29 DIAGNOSIS — D2372 Other benign neoplasm of skin of left lower limb, including hip: Secondary | ICD-10-CM

## 2022-05-29 NOTE — Progress Notes (Signed)
She presents for follow-up today for follow-up of her painful lesions plantar aspect Sub fifth metatarsal head right foot over left.  She states that the small callused areas have been present for the past several months that are tender to walk on they are really thick and would like to get in with Dr. Sharyon Cable or Dr. Adah Perl for general maintenance.  Objective: Vital signs stable alert oriented x3.  Pulses are palpable.  She has a solitary porokeratotic lesions beneath the fifth metatarsal head of the bilateral foot these are painful and deep.  There is underlying bursa.  Assessment: Benign skin lesion Sub fifth metatarsal bilateral bursitis of fifth metatarsal head bilateral.  Plan: Offered her 2 mg dexamethasone injection.  She declined.  Debrided the lesions today placed Salinocaine under occlusion to be washed off at 3 days.  I will follow-up with her in few weeks if necessary.

## 2022-05-31 ENCOUNTER — Other Ambulatory Visit (HOSPITAL_BASED_OUTPATIENT_CLINIC_OR_DEPARTMENT_OTHER): Payer: Self-pay

## 2022-05-31 DIAGNOSIS — Z23 Encounter for immunization: Secondary | ICD-10-CM | POA: Diagnosis not present

## 2022-05-31 MED ORDER — COVID-19 MRNA VAC-TRIS(PFIZER) 30 MCG/0.3ML IM SUSY
0.3000 mL | PREFILLED_SYRINGE | Freq: Once | INTRAMUSCULAR | 0 refills | Status: AC
  Filled 2022-05-31: qty 0.3, 1d supply, fill #0

## 2022-06-04 DIAGNOSIS — I4819 Other persistent atrial fibrillation: Secondary | ICD-10-CM | POA: Insufficient documentation

## 2022-06-04 DIAGNOSIS — I5030 Unspecified diastolic (congestive) heart failure: Secondary | ICD-10-CM | POA: Insufficient documentation

## 2022-06-10 ENCOUNTER — Ambulatory Visit: Payer: Medicare Other | Attending: Cardiology | Admitting: Cardiology

## 2022-06-10 ENCOUNTER — Encounter: Payer: Self-pay | Admitting: Cardiology

## 2022-06-10 VITALS — BP 136/72 | HR 67 | Ht 62.0 in | Wt 192.4 lb

## 2022-06-10 DIAGNOSIS — I5032 Chronic diastolic (congestive) heart failure: Secondary | ICD-10-CM | POA: Insufficient documentation

## 2022-06-10 DIAGNOSIS — I4819 Other persistent atrial fibrillation: Secondary | ICD-10-CM | POA: Diagnosis not present

## 2022-06-10 DIAGNOSIS — Z79899 Other long term (current) drug therapy: Secondary | ICD-10-CM | POA: Diagnosis not present

## 2022-06-10 NOTE — Patient Instructions (Signed)
Medication Instructions:  Your physician recommends that you continue on your current medications as directed. Please refer to the Current Medication list given to you today.  *If you need a refill on your cardiac medications before your next appointment, please call your pharmacy*   Lab Work: None ordered.  If you have labs (blood work) drawn today and your tests are completely normal, you will receive your results only by: [place] (if you have MyChart) OR A paper copy in the mail If you have any lab test that is abnormal or we need to change your treatment, we will call you to review the results.   Testing/Procedures: None ordered.    Follow-Up: At [facility], you and your health needs are our priority.  As part of our continuing mission to provide you with exceptional heart care, we have created designated Provider Care Teams.  These Care Teams include your primary Cardiologist (physician) and Advanced Practice Providers (APPs -  Physician Assistants and Nurse Practitioners) who all work together to provide you with the care you need, when you need it.  We recommend signing up for the patient portal called "MyChart".  Sign up information is provided on this After Visit Summary.  MyChart is used to connect with patients for Virtual Visits (Telemedicine).  Patients are able to view lab/test results, encounter notes, upcoming appointments, etc.  Non-urgent messages can be sent to your provider as well.   To learn more about what you can do with MyChart, go to [email or web address].    Your next appointment:   6 months with Dr Mardene Speak PA  Important Information About Sugar

## 2022-06-10 NOTE — Progress Notes (Signed)
Electrophysiology Office Follow up Visit Note:    Date:  06/10/2022   ID:  Dorothy Powers, DOB May 07, 1946, MRN [id number]  PCP:  Orpah Melter, MD  [facility] Cardiologist:  Freada Bergeron, MD  [facility] Electrophysiologist:  Vickie Epley, MD    Interval History:    Dorothy Powers is a 76 y.o. female who presents for a follow up visit. She was last seen in clinic 02/25/2022 by Barrington Ellison, PA-C, where she was doing well.  Today, she says she has been feeling better since coming out of the hospital. She denies feeling Afib recently. She has been tolerating her medications well.  She had blood work checked on 05/01/2022, where her ALT was 15 and AST was 20.  She has not been able to walk due to pain in her foot, but has begun water aerobics for exercise.  She denies any palpitations, chest pain, shortness of breath, or peripheral edema. No lightheadedness, headaches, syncope, orthopnea, or PND.     Past Medical History:  Diagnosis Date   Hypertension    Meningioma ([place])    Tachycardia    TIA (transient ischemic attack)     Past Surgical History:  Procedure Laterality Date   ABDOMINAL HYSTERECTOMY     BUNIONECTOMY Right 11/2020   CARDIOVERSION N/A 02/01/2022   Procedure: CARDIOVERSION;  Surgeon: Skeet Latch, MD;  Location: [facility] ENDOSCOPY;  Service: Cardiovascular;  Laterality: N/A;    Current Medications: Current Meds  Medication Sig   albuterol (VENTOLIN HFA) 108 (90 Base) MCG/ACT inhaler Inhale 1-2 puffs into the lungs every 6 (six) hours as needed for wheezing.   amiodarone (PACERONE) 200 MG tablet Take 1 tablet (200 mg total) by mouth daily.   apixaban (ELIQUIS) 5 MG TABS tablet Take 1 tablet (5 mg total) by mouth 2 (two) times daily.   Ascorbic Acid (VITAMIN C PO) Take 1 tablet by mouth daily.   azelastine (ASTELIN) 0.1 % nasal spray Place 1 spray into both nostrils 2 (two) times daily.   CALCIUM-VITAMIN D PO Take 2 tablets by  mouth at bedtime.   cetirizine (ZYRTEC) 10 MG tablet Take 10 mg by mouth daily.   Cholecalciferol (VITAMIN D3) 125 MCG (5000 UT) TABS Take 5,000 Units by mouth daily.   Coenzyme Q10 (CO Q 10 PO) Take 200 mg by mouth daily.   diltiazem (CARDIZEM CD) 300 MG 24 hr capsule Take 1 capsule (300 mg total) by mouth daily.   diphenhydrAMINE (BENADRYL) 25 MG tablet Take 25-50 mg by mouth 2 (two) times daily as needed for allergies.   DOXYCYCLINE PO Take 50 mg by mouth daily.   ezetimibe (ZETIA) 10 MG tablet Take 10 mg by mouth daily.   furosemide (LASIX) 40 MG tablet Take 1 tablet (40 mg total) by mouth daily.   glucosamine-chondroitin 500-400 MG tablet Take 1 tablet by mouth in the morning and at bedtime. Triple strength   pantoprazole (PROTONIX) 40 MG tablet Take 40 mg by mouth daily.   Polyethyl Glycol-Propyl Glycol (SYSTANE OP) Place 1 drop into both eyes daily as needed (allergies).   potassium chloride SA (KLOR-CON M) 20 MEQ tablet Take 1 tablet (20 mEq total) by mouth daily.   rosuvastatin (CRESTOR) 10 MG tablet Take 10 mg by mouth daily.     Allergies:   Codeine, Lovastatin, and Sulfa antibiotics   Social History   Socioeconomic History   Marital status: Married    Spouse name: Not on file   Number of children:  0   Years of education: college   Highest education level: Bachelor's degree (e.g., BA, AB, BS)  Occupational History   Occupation: Retired  Tobacco Use   Smoking status: Former    Types: Cigarettes   Smokeless tobacco: Never  Substance and Sexual Activity   Alcohol use: Yes    Comment: social   Drug use: Never   Sexual activity: Not on file  Other Topics Concern   Not on file  Social History Narrative   Lives at home with husband.   Right-handed.   No daily use of caffeine.   Social Determinants of Health   Financial Resource Strain: Not on file  Food Insecurity: Not on file  Transportation Needs: Not on file  Physical Activity: Not on file  Stress: Not on file   Social Connections: Not on file     Family History: The patient's family history includes Heart attack in her father; Heart disease in her mother.  ROS:   Please see the history of present illness.     All other systems reviewed and are negative.  EKGs/Labs/Other Studies Reviewed:    The following studies were reviewed today:  Echo 01/31/2022: 1. Left ventricular ejection fraction, by estimation, is 60 to 65%. The  left ventricle has normal function. The left ventricle has no regional  wall motion abnormalities.   2. Right ventricular systolic function is normal. The right ventricular  size is normal.   3. Small pericardial effusion. There is no RV or RA diastolic collapse.  There is respiratory inflow variation across the tricuspid valve but not  across the mitral valve. The IVC is dilated and does not collapse with  inspiration. Findings are  indeterminate for tamponade. However, given the lack of RV collapse or  mitral valve flow variation, this likely represents volume overload rather  than tamponade. a small pericardial effusion is present. The pericardial  effusion is circumferential. There  is no evidence of cardiac tamponade.   4. The mitral valve is normal in structure. No evidence of mitral valve  regurgitation. No evidence of mitral stenosis.   5. The aortic valve is tricuspid. There is moderate calcification of the  aortic valve. There is moderate thickening of the aortic valve. Aortic  valve regurgitation is not visualized. No aortic stenosis is present.   6. The inferior vena cava is dilated in size with <50% respiratory  variability, suggesting right atrial pressure of 15 mmHg.    CTA PE 01/30/2022: FINDINGS: Evaluation of this exam is limited due to respiratory motion artifact.   Cardiovascular: Top-normal cardiac size. Small pericardial effusion measuring 1 cm in thickness. Three vessel coronary vascular calcification. There is mild atherosclerotic  calcification of the thoracic aorta. No aneurysmal dilatation. Evaluation of the pulmonary arteries is limited due to respiratory motion artifact. No pulmonary artery embolus identified.   Mediastinum/Nodes: Mildly enlarged lymph nodes in the prevascular space measuring up to 12 mm short axis. No hilar adenopathy. The esophagus is grossly unremarkable. No mediastinal fluid collection.   Lungs/Pleura: Small bilateral pleural effusions with fluid noted along the minor fissure. Left lung base subsegmental atelectasis. Pneumonia is not excluded. No pneumothorax. The central airways are patent.   Upper Abdomen: No acute abnormality.   Musculoskeletal: No acute osseous pathology.   Review of the MIP images confirms the above findings.   IMPRESSION: 1. No CT evidence of pulmonary artery embolus. 2. Small bilateral pleural effusions with left lung base subsegmental atelectasis. Pneumonia is not excluded. 3. Small pericardial  effusion. 4. Aortic Atherosclerosis (ICD10-I70.0).   EKG:  EKG is personally reviewed.  06/10/22:  Sinus rhythm. RBBB. Left Fascicular block.  Recent Labs: 02/02/2022: B Natriuretic Peptide 77.8 02/06/2022: Hemoglobin 11.8; Platelets 422 02/07/2022: Magnesium 2.2 02/25/2022: ALT 21; BUN 19; Creatinine, Ser 0.86; Potassium 4.3; Sodium 134; TSH 4.070   Recent Lipid Panel    Component Value Date/Time   CHOL 203 (H) 02/14/2021 0553   TRIG 55 02/14/2021 0553   HDL 67 02/14/2021 0553   CHOLHDL 3.0 02/14/2021 0553   VLDL 11 02/14/2021 0553   LDLCALC 125 (H) 02/14/2021 0553    Physical Exam:    VS:  BP 136/72   Pulse 67   Ht '5\' 2"'$  (1.575 m)   Wt 192 lb 6.4 oz (87.3 kg)   SpO2 94%   BMI 35.19 kg/m     Wt Readings from Last 3 Encounters:  06/10/22 192 lb 6.4 oz (87.3 kg)  04/18/22 196 lb (88.9 kg)  02/25/22 204 lb (92.5 kg)     GEN: Well nourished, well developed in no acute distress.  Obese HEENT: Normal NECK: No JVD; No carotid bruits LYMPHATICS: No  lymphadenopathy CARDIAC: RRR, no murmurs, rubs, gallops RESPIRATORY:  Clear to auscultation without rales, wheezing or rhonchi  ABDOMEN: Soft, non-tender, non-distended MUSCULOSKELETAL:  No edema; No deformity  SKIN: Warm and dry NEUROLOGIC:  Alert and oriented x 3 PSYCHIATRIC:  Normal affect      ASSESSMENT:    1. Persistent atrial fibrillation ([place])   2. Chronic heart failure with preserved ejection fraction ([place])   3. Encounter for long-term (current) use of high-risk medication    PLAN:    In order of problems listed above:  #Persistent atrial fibrillation Maintaining sinus rhythm on amiodarone 200 mg by mouth daily.  Recent lab work in October showed stable liver function.  Will plan to repeat labs in 6 months and have her follow-up with the APP.  #Chronic diastolic heart failure Euvolemic today.  Warm and dry.  Continue current medical therapy.  Weigh daily.  Follow-up 6 months with APP   Medication Adjustments/Labs and Tests Ordered: Current medicines are reviewed at length with the patient today.  Concerns regarding medicines are outlined above.   Orders Placed This Encounter  Procedures   EKG 12-Lead   No orders of the defined types were placed in this encounter.   I,Breanna Adamick,acting as a scribe for Vickie Epley, MD.,have documented all relevant documentation on the behalf of Vickie Epley, MD,as directed by  Vickie Epley, MD while in the presence of Vickie Epley, MD.  I, Vickie Epley, MD, have reviewed all documentation for this visit. The documentation on 06/10/22 for the exam, diagnosis, procedures, and orders are all accurate and complete.   Signed, Lars Mage, MD, [facility], [facility] 06/10/2022 8:27 AM    Electrophysiology [facility]

## 2022-06-12 ENCOUNTER — Ambulatory Visit: Payer: Self-pay | Admitting: Podiatry

## 2022-06-12 ENCOUNTER — Ambulatory Visit: Payer: Medicare Other | Admitting: Podiatry

## 2022-07-16 DIAGNOSIS — L820 Inflamed seborrheic keratosis: Secondary | ICD-10-CM | POA: Diagnosis not present

## 2022-07-16 DIAGNOSIS — L718 Other rosacea: Secondary | ICD-10-CM | POA: Diagnosis not present

## 2022-07-16 DIAGNOSIS — L661 Lichen planopilaris: Secondary | ICD-10-CM | POA: Diagnosis not present

## 2022-07-19 DIAGNOSIS — J300 Vasomotor rhinitis: Secondary | ICD-10-CM | POA: Diagnosis not present

## 2022-07-19 DIAGNOSIS — R052 Subacute cough: Secondary | ICD-10-CM | POA: Diagnosis not present

## 2022-07-19 DIAGNOSIS — R040 Epistaxis: Secondary | ICD-10-CM | POA: Diagnosis not present

## 2022-07-19 DIAGNOSIS — J310 Chronic rhinitis: Secondary | ICD-10-CM | POA: Diagnosis not present

## 2022-07-19 DIAGNOSIS — H1045 Other chronic allergic conjunctivitis: Secondary | ICD-10-CM | POA: Diagnosis not present

## 2022-08-02 ENCOUNTER — Other Ambulatory Visit: Payer: Self-pay | Admitting: Neurosurgery

## 2022-08-02 DIAGNOSIS — D496 Neoplasm of unspecified behavior of brain: Secondary | ICD-10-CM

## 2022-08-12 DIAGNOSIS — U071 COVID-19: Secondary | ICD-10-CM | POA: Diagnosis not present

## 2022-08-22 ENCOUNTER — Encounter (HOSPITAL_COMMUNITY): Payer: Self-pay | Admitting: *Deleted

## 2022-08-26 DIAGNOSIS — Z780 Asymptomatic menopausal state: Secondary | ICD-10-CM | POA: Diagnosis not present

## 2022-08-26 DIAGNOSIS — M85852 Other specified disorders of bone density and structure, left thigh: Secondary | ICD-10-CM | POA: Diagnosis not present

## 2022-09-05 ENCOUNTER — Ambulatory Visit
Admission: RE | Admit: 2022-09-05 | Discharge: 2022-09-05 | Disposition: A | Discharge status: Home / Self Care | Payer: Medicare Other | Source: Ambulatory Visit | Attending: Neurosurgery | Admitting: Neurosurgery

## 2022-09-05 ENCOUNTER — Ambulatory Visit: Payer: Medicare Other | Admitting: Podiatry

## 2022-09-05 DIAGNOSIS — D496 Neoplasm of unspecified behavior of brain: Secondary | ICD-10-CM

## 2022-09-05 MED ORDER — GADOPICLENOL 0.5 MMOL/ML IV SOLN
7.5000 mL | Freq: Once | INTRAVENOUS | Status: AC | PRN
  Administered 2022-09-05: 7.5 mL via INTRAVENOUS

## 2022-09-09 ENCOUNTER — Encounter: Payer: Self-pay | Admitting: Podiatry

## 2022-09-09 ENCOUNTER — Ambulatory Visit (INDEPENDENT_AMBULATORY_CARE_PROVIDER_SITE_OTHER): Payer: Medicare Other | Admitting: Podiatry

## 2022-09-09 DIAGNOSIS — D689 Coagulation defect, unspecified: Secondary | ICD-10-CM | POA: Diagnosis not present

## 2022-09-09 DIAGNOSIS — Q828 Other specified congenital malformations of skin: Secondary | ICD-10-CM | POA: Diagnosis not present

## 2022-09-09 NOTE — Progress Notes (Signed)
  Subjective:  Patient ID: Dorothy Powers, female    DOB: 1945/10/23,   MRN: [id number]  Chief Complaint  Patient presents with   corn trim    Corn trim     77 y.o. female presents for concern of lesion on her right foot. Has been in the care of Dr. Milinda Pointer who has trimmed the nail in the past.  Is on eliquis and at risk for foot care. . Denies any other pedal complaints. Denies n/v/f/c.   Past Medical History:  Diagnosis Date   Hypertension    Meningioma (HCC)    Tachycardia    TIA (transient ischemic attack)     Objective:  Physical Exam: Vascular: DP/PT pulses 2/4 bilateral. CFT <3 seconds. Normal hair growth on digits. No edema.  Skin. No lacerations or abrasions bilateral feet. Cored hyperkeratotic lesions noted sub fifth metatarsal bilateral. Hypekerkatosis noted sub second metatrsal on right.  Musculoskeletal: MMT 5/5 bilateral lower extremities in DF, PF, Inversion and Eversion. Deceased ROM in DF of ankle joint. Right digits 2-5 hammered. Elevation noted of the first MPJ and hallux. No pain to palpation.  Neurological: Sensation intact to light touch.   Assessment:   1. Porokeratosis   2. Coagulation defect [facility])      Plan:  Patient was evaluated and treated and all questions answered.  -Mechanically debrided hyperkeratotic lesions with chisel without incident.  -Did discuss potential CMO for helping offload certain areas. Patient will consider.  -Answered all patient questions -Patient to return  in 3 months for at risk foot care -Patient advised to call the office if any problems or questions arise in the meantime.   Lorenda Peck, DPM

## 2022-09-12 DIAGNOSIS — E7800 Pure hypercholesterolemia, unspecified: Secondary | ICD-10-CM | POA: Diagnosis not present

## 2022-09-12 DIAGNOSIS — I4891 Unspecified atrial fibrillation: Secondary | ICD-10-CM | POA: Diagnosis not present

## 2022-09-12 DIAGNOSIS — I10 Essential (primary) hypertension: Secondary | ICD-10-CM | POA: Diagnosis not present

## 2022-09-12 DIAGNOSIS — I5032 Chronic diastolic (congestive) heart failure: Secondary | ICD-10-CM | POA: Diagnosis not present

## 2022-09-12 DIAGNOSIS — K219 Gastro-esophageal reflux disease without esophagitis: Secondary | ICD-10-CM | POA: Diagnosis not present

## 2022-10-30 DIAGNOSIS — I10 Essential (primary) hypertension: Secondary | ICD-10-CM | POA: Diagnosis not present

## 2022-10-30 DIAGNOSIS — E7800 Pure hypercholesterolemia, unspecified: Secondary | ICD-10-CM | POA: Diagnosis not present

## 2022-10-30 DIAGNOSIS — I8390 Asymptomatic varicose veins of unspecified lower extremity: Secondary | ICD-10-CM | POA: Diagnosis not present

## 2022-10-30 DIAGNOSIS — K219 Gastro-esophageal reflux disease without esophagitis: Secondary | ICD-10-CM | POA: Diagnosis not present

## 2022-11-06 DIAGNOSIS — D6869 Other thrombophilia: Secondary | ICD-10-CM | POA: Diagnosis not present

## 2022-11-06 DIAGNOSIS — I4891 Unspecified atrial fibrillation: Secondary | ICD-10-CM | POA: Diagnosis not present

## 2022-11-06 DIAGNOSIS — Z0000 Encounter for general adult medical examination without abnormal findings: Secondary | ICD-10-CM | POA: Diagnosis not present

## 2022-11-06 DIAGNOSIS — I700 Atherosclerosis of aorta: Secondary | ICD-10-CM | POA: Diagnosis not present

## 2022-12-04 NOTE — Progress Notes (Signed)
  Electrophysiology Office Note:   Date:  12/10/2022  ID:  Dorothy Powers, DOB 25-Mar-1946, MRN [id number]  Primary Cardiologist: Meriam Sprague, MD Electrophysiologist: Lanier Prude, MD   History of Present Illness:   Dorothy Powers is a 77 y.o. female with h/o persistent atrial fibrillation and HFpEF seen today for routine electrophysiology followup. Since last being seen in our clinic the patient reports doing very well.  she denies chest pain, palpitations, dyspnea, PND, orthopnea, nausea, vomiting, dizziness, syncope, edema, weight gain, or early satiety.   Review of systems complete and found to be negative unless listed in HPI.   Studies Reviewed:    EKG is ordered today. Personal review shows NSR at 84 with RBBB and 1degree AVB    Physical Exam:   VS:  BP 132/70   Pulse 68   Ht 5\' 2"  (1.575 m)   Wt 189 lb 12.8 oz (86.1 kg)   SpO2 97%   BMI 34.71 kg/m    Wt Readings from Last 3 Encounters:  12/10/22 189 lb 12.8 oz (86.1 kg)  06/10/22 192 lb 6.4 oz (87.3 kg)  04/18/22 196 lb (88.9 kg)     GEN: Well nourished, well developed in no acute distress NECK: No JVD; No carotid bruits CARDIAC: Regular rate and rhythm, no murmurs, rubs, gallops RESPIRATORY:  Clear to auscultation without rales, wheezing or rhonchi  ABDOMEN: Soft, non-tender, non-distended EXTREMITIES:  No edema; No deformity   ASSESSMENT AND PLAN:    Persistent atrial fibrillation EKG today shows NSR at 68 bpm Continue amiodarone 200 mg daily. Surveillance labs today.  Continue eliquis 5 mg BID Poor candidate for ablation  HFpEF Volume status stable on exam. Continue lasix 40 mg daily   Follow up with Dr. Lalla Brothers in 6 months  Signed, Graciella Freer, PA-C

## 2022-12-10 ENCOUNTER — Ambulatory Visit: Payer: Medicare Other | Attending: Student | Admitting: Student

## 2022-12-10 ENCOUNTER — Encounter: Payer: Self-pay | Admitting: Student

## 2022-12-10 VITALS — BP 132/70 | HR 68 | Ht 62.0 in | Wt 189.8 lb

## 2022-12-10 DIAGNOSIS — I5032 Chronic diastolic (congestive) heart failure: Secondary | ICD-10-CM | POA: Diagnosis not present

## 2022-12-10 DIAGNOSIS — Z79899 Other long term (current) drug therapy: Secondary | ICD-10-CM | POA: Insufficient documentation

## 2022-12-10 DIAGNOSIS — I4819 Other persistent atrial fibrillation: Secondary | ICD-10-CM | POA: Diagnosis not present

## 2022-12-10 NOTE — Patient Instructions (Signed)
Medication Instructions:  Your physician recommends that you continue on your current medications as directed. Please refer to the Current Medication list given to you today.  *If you need a refill on your cardiac medications before your next appointment, please call your pharmacy*  Lab Work: CMET, TSH, CBC, FreeT4--TODAY If you have labs (blood work) drawn today and your tests are completely normal, you will receive your results only by: MyChart Message (if you have MyChart) OR A paper copy in the mail If you have any lab test that is abnormal or we need to change your treatment, we will call you to review the results.  Follow-Up: At [facility], you and your health needs are our priority.  As part of our continuing mission to provide you with exceptional heart care, we have created designated Provider Care Teams.  These Care Teams include your primary Cardiologist (physician) and Advanced Practice Providers (APPs -  Physician Assistants and Nurse Practitioners) who all work together to provide you with the care you need, when you need it.  Your next appointment:   6 month(s)  Provider:   Steffanie Dunn, MD

## 2022-12-11 LAB — COMPREHENSIVE METABOLIC PANEL
ALT: 22 IU/L (ref 0–32)
AST: 23 IU/L (ref 0–40)
Albumin/Globulin Ratio: 1.9 (ref 1.2–2.2)
Albumin: 4.3 g/dL (ref 3.8–4.8)
Alkaline Phosphatase: 141 IU/L — ABNORMAL HIGH (ref 44–121)
BUN/Creatinine Ratio: 20 (ref 12–28)
BUN: 18 mg/dL (ref 8–27)
Bilirubin Total: 0.4 mg/dL (ref 0.0–1.2)
CO2: 27 mmol/L (ref 20–29)
Calcium: 9.5 mg/dL (ref 8.7–10.3)
Chloride: 98 mmol/L (ref 96–106)
Creatinine, Ser: 0.91 mg/dL (ref 0.57–1.00)
Globulin, Total: 2.3 g/dL (ref 1.5–4.5)
Glucose: 89 mg/dL (ref 70–99)
Potassium: 4.4 mmol/L (ref 3.5–5.2)
Sodium: 139 mmol/L (ref 134–144)
Total Protein: 6.6 g/dL (ref 6.0–8.5)
eGFR: 64 mL/min/{1.73_m2} (ref >59)

## 2022-12-11 LAB — CBC
Hematocrit: 42.4 % (ref 34.0–46.6)
Hemoglobin: 13.6 g/dL (ref 11.1–15.9)
MCH: 28.6 pg (ref 26.6–33.0)
MCHC: 32.1 g/dL (ref 31.5–35.7)
MCV: 89 fL (ref 79–97)
Platelets: 318 10*3/uL (ref 150–450)
RBC: 4.75 x10E6/uL (ref 3.77–5.28)
RDW: 13.1 % (ref 11.7–15.4)
WBC: 4.2 10*3/uL (ref 3.4–10.8)

## 2022-12-11 LAB — T4, FREE: Free T4: 1.21 ng/dL (ref 0.82–1.77)

## 2022-12-11 LAB — TSH: TSH: 4.77 u[IU]/mL — ABNORMAL HIGH (ref 0.450–4.500)

## 2022-12-25 ENCOUNTER — Ambulatory Visit (INDEPENDENT_AMBULATORY_CARE_PROVIDER_SITE_OTHER): Payer: Medicare Other | Admitting: Podiatry

## 2022-12-25 ENCOUNTER — Encounter: Payer: Self-pay | Admitting: Podiatry

## 2022-12-25 DIAGNOSIS — Q828 Other specified congenital malformations of skin: Secondary | ICD-10-CM | POA: Diagnosis not present

## 2022-12-25 DIAGNOSIS — D689 Coagulation defect, unspecified: Secondary | ICD-10-CM

## 2022-12-25 DIAGNOSIS — L84 Corns and callosities: Secondary | ICD-10-CM

## 2022-12-29 NOTE — Progress Notes (Signed)
  Subjective:  Patient ID: Dorothy Powers, female    DOB: 11/06/1945,  MRN: [id number]  Dorothy Powers presents to clinic today for at risk foot care with h/o coagulation defect and painful porokeratotic lesions b/l lower extremities. Pain prevent(s) comfortable ambulation. Aggravating factor is weightbearing with and without shoegear.  Chief Complaint  Patient presents with   Callouses    CALLUS BILAT  CORN RIGHT 5TH   New problem(s): None.   PCP is Joycelyn Rua, MD.  Allergies  Allergen Reactions   Codeine Other (See Comments)    "Couldn't move one night for a short time, after taking the medicaiton"   Lovastatin Other (See Comments)    Leg pain   Sulfa Antibiotics Other (See Comments)    "Felt weak"    Review of Systems: Negative except as noted in the HPI.  Objective: No changes noted in today's physical examination. There were no vitals filed for this visit. Dorothy Powers is a pleasant 77 y.o. female in NAD. AAO x 3.  Vascular Examination: Capillary refill time <3 seconds b/l. Vascular status intact b/l with palpable pedal pulses. Pedal hair absent b/l. No pain with calf compression b/l. Skin temperature gradient WNL b/l. No cyanosis or clubbing b/l. No ischemia or gangrene noted b/l.   Neurological Examination: Sensation grossly intact b/l with 10 gram monofilament. Vibratory sensation intact b/l.   Dermatological Examination: Pedal skin with normal turgor, texture and tone b/l.  No open wounds. No interdigital macerations.   Toenails 1-5 b/l well maintained with adequate length. No erythema, no edema, no drainage, no fluctuance. Porokeratotic lesion(s) submet head 5 b/l. No erythema, no edema, no drainage, no fluctuance. Preulcerative lesion noted submet head 4 left foot. There is visible subdermal hemorrhage. There is no surrounding erythema, no edema, no drainage, no odor, no fluctuance.  Musculoskeletal Examination: Muscle strength 5/5 to all lower  extremity muscle groups bilaterally. Hammertoe(s) noted to the 2-5 right foot.  Radiographs: None  Assessment/Plan: 1. Porokeratosis   2. Pre-ulcerative calluses   3. Coagulation defect [facility])     -Patient was evaluated and treated. All patient's and/or POA's questions/concerns answered on today's visit. -Patient to continue soft, supportive shoe gear daily. -Callus(es) submet head 4 left foot pared utilizing sterile scalpel blade without complication or incident. Total number debrided =1. -Preulcerative lesion pared submet head 5 b/l utilizing sterile scalpel blade. Total number pared=2. -Patient/POA to call should there be question/concern in the interim.   Return in about 9 weeks (around 02/26/2023).  Freddie Breech, DPM

## 2023-01-08 DIAGNOSIS — Z1231 Encounter for screening mammogram for malignant neoplasm of breast: Secondary | ICD-10-CM | POA: Diagnosis not present

## 2023-02-03 DIAGNOSIS — M545 Low back pain, unspecified: Secondary | ICD-10-CM | POA: Diagnosis not present

## 2023-02-25 ENCOUNTER — Other Ambulatory Visit: Payer: Self-pay | Admitting: Student

## 2023-02-26 MED ORDER — POTASSIUM CHLORIDE CRYS ER 20 MEQ PO TBCR: 20.0000 meq | EXTENDED_RELEASE_TABLET | Freq: Every day | ORAL | 0 refills | Status: DC

## 2023-02-26 NOTE — Telephone Encounter (Signed)
Pt last saw Dorothy Powers, [name] on 12/10/22, last labs 12/10/22 Creat 0.91, age 77, weight 86.1kg, based on specified criteria pt is on appropriate dosage of Eliquis 5mg  BID for afib.  Will refill rx.

## 2023-02-28 ENCOUNTER — Ambulatory Visit: Payer: Medicare Other | Admitting: Podiatry

## 2023-03-03 ENCOUNTER — Ambulatory Visit (INDEPENDENT_AMBULATORY_CARE_PROVIDER_SITE_OTHER): Payer: Medicare Other | Admitting: Podiatry

## 2023-03-03 ENCOUNTER — Encounter: Payer: Self-pay | Admitting: Podiatry

## 2023-03-03 DIAGNOSIS — L84 Corns and callosities: Secondary | ICD-10-CM | POA: Diagnosis not present

## 2023-03-03 DIAGNOSIS — Q828 Other specified congenital malformations of skin: Secondary | ICD-10-CM

## 2023-03-03 DIAGNOSIS — D689 Coagulation defect, unspecified: Secondary | ICD-10-CM | POA: Diagnosis not present

## 2023-03-08 NOTE — Progress Notes (Signed)
  Subjective:  Patient ID: Dorothy Powers, female    DOB: July 25, 1945,  MRN: [id number]  Dorothy Powers presents to clinic today for preulcerative lesion(s) of left foot and porokeratoses of both feet. Pain prevent(s) comfortable ambulation. Aggravating factor is weightbearing with and without shoegear. Symptoms relieved with periodic professional debridement.  Chief Complaint  Patient presents with   Nail Problem    RFC,Referring Provider Joycelyn Rua, MD, lov:04/24        New problem(s): None.   PCP is Joycelyn Rua, MD.  Allergies  Allergen Reactions   Codeine Other (See Comments)    "Couldn't move one night for a short time, after taking the medicaiton"   Lovastatin Other (See Comments)    Leg pain   Sulfa Antibiotics Other (See Comments)    "Felt weak"    Review of Systems: Negative except as noted in the HPI.  Objective: No changes noted in today's physical examination. There were no vitals filed for this visit. Dorothy Powers is a pleasant 78 y.o. female in NAD. AAO x 3.  Vascular Examination: Capillary refill time <3 seconds b/l. Vascular status intact b/l with palpable pedal pulses. Pedal hair absent b/l. No pain with calf compression b/l. Skin temperature gradient WNL b/l. No cyanosis or clubbing b/l. No ischemia or gangrene noted b/l.   Neurological Examination: Sensation grossly intact b/l with 10 gram monofilament. Vibratory sensation intact b/l.   Dermatological Examination: Pedal skin with normal turgor, texture and tone b/l.  No open wounds. No interdigital macerations.   Toenails 1-5 b/l well maintained with adequate length. No erythema, no edema, no drainage, no fluctuance.   Porokeratotic lesion(s) submet head 5 b/l. No erythema, no edema, no drainage, no fluctuance.   Preulcerative lesion noted submet head 4 left foot. There is visible subdermal hemorrhage. There is no surrounding erythema, no edema, no drainage, no odor, no  fluctuance.  Musculoskeletal Examination: Muscle strength 5/5 to all lower extremity muscle groups bilaterally. Hammertoe(s) noted to the 2-5 right foot.  Radiographs: None  Assessment/Plan: 1. Porokeratosis   2. Pre-ulcerative calluses   3. Coagulation defect (HCC)   -Consent given for treatment as described below: -Examined patient. -Continue supportive shoe gear daily. -Preulcerative lesion pared submet head 4 left foot utilizing sterile scalpel blade. Total number pared=1. -Porokeratotic lesion(s) submet head 5 left foot and submet head 5 right foot pared and enucleated with sterile currette without incident. Total number of lesions debrided=2. -Patient/POA to call should there be question/concern in the interim.   Return in about 9 weeks (around 05/05/2023).  Freddie Breech, DPM

## 2023-03-25 DIAGNOSIS — D496 Neoplasm of unspecified behavior of brain: Secondary | ICD-10-CM | POA: Diagnosis not present

## 2023-04-04 ENCOUNTER — Telehealth: Payer: Self-pay | Admitting: Cardiology

## 2023-04-04 NOTE — Telephone Encounter (Signed)
Patient c/o Palpitations:  High priority if patient c/o lightheadedness, shortness of breath, or chest pain  How long have you had palpitations/irregular HR/ Afib? Are you having the symptoms now? A week and yes   Are you currently experiencing lightheadedness, SOB or CP? No   Do you have a history of afib (atrial fibrillation) or irregular heart rhythm? Yes   Have you checked your BP or HR? (document readings if available): No   Are you experiencing any other symptoms? Fatigued

## 2023-04-04 NOTE — Telephone Encounter (Signed)
Pt reports that she is "tired, more tired lately". This week "feeling quivering in my chest". Touch of dizziness and sob. Monday pt walked 30 minutes with no problems, but since then she feels palpitations/sob w/ little activity. Occurring more often this week.  Denies CP, dizziness/light headedness BP: 130s/50-60s. HRs 60s that pt is aware of, but she only takes it once a day Typically cannot feel when she is out of rhythm. Forwarding to AFib clinic to see about getting pt an appt in next week/two for further evaluation, pt agreeable to plan.

## 2023-04-08 ENCOUNTER — Ambulatory Visit (HOSPITAL_COMMUNITY)
Admission: RE | Admit: 2023-04-08 | Discharge: 2023-04-08 | Disposition: A | Discharge status: Home / Self Care | Payer: Medicare Other | Source: Ambulatory Visit | Attending: Internal Medicine | Admitting: Internal Medicine

## 2023-04-08 VITALS — BP 152/60 | HR 61 | Ht 62.0 in | Wt 191.4 lb

## 2023-04-08 DIAGNOSIS — I4819 Other persistent atrial fibrillation: Secondary | ICD-10-CM | POA: Insufficient documentation

## 2023-04-08 DIAGNOSIS — R9431 Abnormal electrocardiogram [ECG] [EKG]: Secondary | ICD-10-CM | POA: Insufficient documentation

## 2023-04-08 DIAGNOSIS — Z5181 Encounter for therapeutic drug level monitoring: Secondary | ICD-10-CM | POA: Diagnosis not present

## 2023-04-08 DIAGNOSIS — I4510 Unspecified right bundle-branch block: Secondary | ICD-10-CM | POA: Diagnosis present

## 2023-04-08 DIAGNOSIS — Z7901 Long term (current) use of anticoagulants: Secondary | ICD-10-CM | POA: Diagnosis not present

## 2023-04-08 DIAGNOSIS — I452 Bifascicular block: Secondary | ICD-10-CM | POA: Diagnosis not present

## 2023-04-08 DIAGNOSIS — Z79899 Other long term (current) drug therapy: Secondary | ICD-10-CM | POA: Insufficient documentation

## 2023-04-08 DIAGNOSIS — I10 Essential (primary) hypertension: Secondary | ICD-10-CM | POA: Diagnosis present

## 2023-04-08 NOTE — Progress Notes (Signed)
Primary Care Physician: Joycelyn Rua, MD Referring Physician: [facility] f/u    Dorothy Powers is a 77 y.o. female with a h/o  HTN, HLP, tachycardia, history of TIA presented to ED with chest discomfort.  Patient was sitting in her chair watching TV evening before the admission when she dropped acute right-sided chest discomfort radiating to the right shoulder.  She did not feel any palpitations and continued to have ongoing symptoms.  She felt her pulse and noticed to be irregular and presented to ED.  She has 77 year old more history of tachycardia, on atenolol but denies any history of A-fib.  No prior cardiac work-up.  In ED EKG showed A-fib with rate 121, LAD, RBBB. CTA chest negative for PE but notable for mild cardiomegaly with small pericardial effusion and patchy groundglass opacities bilaterally that likely reflect mild edema.  Patient was given IV diltiazem load 15 mg and started on infusion. She was started on eliquis 5 mg bid for a CHA2DS2VASc  score of 6. She was discharged in rate controlled afib, resuming atenolol and cardizem drip was d/c. Echo showed EF of 60-65%.  She remains in afib today at 107 bpm with a RBBB. She is tolerating afib ok. She is being complaint with eliquis, counting first full day as 7/2. She has had one nosebleed since on drug but has had in the pat, discussed she may need to see ENT as a nose cautery may be needed,  as she has had nosebleeds in the past, usually from the rt nostril. No alcohol, no sleep apnea per husband, minimal caffeine.   On f/u 04/08/23, she is currently in NSR. She takes amiodarone 200 mg daily. She noted "quivering sensation" over the weekend. She has not felt it since then. Today, she feels well. She actually just purchased a Kardiamobile device earlier today but has not set it up yet. She missed a dose of Eliquis last week but cannot recall which day.   Today, she denies symptoms of palpitations, chest pain, shortness of breath,  orthopnea, PND, lower extremity edema, dizziness, presyncope, syncope, or neurologic sequela. The patient is tolerating medications without difficulties and is otherwise without complaint today.   Past Medical History:  Diagnosis Date   Hypertension    Meningioma (HCC)    Tachycardia    TIA (transient ischemic attack)    Past Surgical History:  Procedure Laterality Date   ABDOMINAL HYSTERECTOMY     BUNIONECTOMY Right 11/2020   CARDIOVERSION N/A 02/01/2022   Procedure: CARDIOVERSION;  Surgeon: Chilton Si, MD;  Location: [facility] ENDOSCOPY;  Service: Cardiovascular;  Laterality: N/A;    Current Outpatient Medications  Medication Sig Dispense Refill   albuterol (VENTOLIN HFA) 108 (90 Base) MCG/ACT inhaler Inhale 1-2 puffs into the lungs every 6 (six) hours as needed for wheezing.     amiodarone (PACERONE) 200 MG tablet Take 1 tablet by mouth once daily 90 tablet 2   apixaban (ELIQUIS) 5 MG TABS tablet Take 1 tablet by mouth twice daily 180 tablet 2   Ascorbic Acid (VITAMIN C PO) Take 1 tablet by mouth daily.     azelastine (ASTELIN) 0.1 % nasal spray Place 1 spray into both nostrils 2 (two) times daily.     CALCIUM-VITAMIN D PO Take 2 tablets by mouth at bedtime.     cetirizine (ZYRTEC) 10 MG tablet Take 10 mg by mouth daily.     Cholecalciferol (VITAMIN D3) 125 MCG (5000 UT) TABS Take 5,000 Units by mouth daily.  Coenzyme Q10 (CO Q 10 PO) Take 200 mg by mouth daily.     diltiazem (TIAZAC) 300 MG 24 hr capsule Take 1 capsule by mouth once daily 90 capsule 2   diphenhydrAMINE (BENADRYL) 25 MG tablet Take 25-50 mg by mouth 2 (two) times daily as needed for allergies.     DOXYCYCLINE PO Take 50 mg by mouth daily.     ezetimibe (ZETIA) 10 MG tablet Take 10 mg by mouth daily.     furosemide (LASIX) 40 MG tablet Take 1 tablet by mouth once daily 90 tablet 2   glucosamine-chondroitin 500-400 MG tablet Take 1 tablet by mouth in the morning and at bedtime. Triple strength     pantoprazole  (PROTONIX) 40 MG tablet Take 40 mg by mouth daily.     Polyethyl Glycol-Propyl Glycol (SYSTANE OP) Place 1 drop into both eyes daily as needed (allergies).     potassium chloride SA (KLOR-CON M) 20 MEQ tablet Take 1 tablet (20 mEq total) by mouth daily. 90 tablet 0   rosuvastatin (CRESTOR) 10 MG tablet Take 10 mg by mouth daily.     No current facility-administered medications for this visit.    Allergies  Allergen Reactions   Codeine Other (See Comments)    "Couldn't move one night for a short time, after taking the medicaiton"   Lovastatin Other (See Comments)    Leg pain   Sulfa Antibiotics Other (See Comments)    "Felt weak"    ROS- All systems are reviewed and negative except as per the HPI above  Physical Exam: There were no vitals filed for this visit. Wt Readings from Last 3 Encounters:  12/10/22 86.1 kg  06/10/22 87.3 kg  04/18/22 88.9 kg    Labs: Lab Results  Component Value Date   NA 139 12/10/2022   K 4.4 12/10/2022   CL 98 12/10/2022   CO2 27 12/10/2022   GLUCOSE 89 12/10/2022   BUN 18 12/10/2022   CREATININE 0.91 12/10/2022   CALCIUM 9.5 12/10/2022   PHOS 3.9 01/30/2022   MG 2.2 02/07/2022   Lab Results  Component Value Date   INR 1.0 02/14/2021   Lab Results  Component Value Date   CHOL 203 (H) 02/14/2021   HDL 67 02/14/2021   LDLCALC 125 (H) 02/14/2021   TRIG 55 02/14/2021   GEN- The patient is well appearing, alert and oriented x 3 today.   Neck - no JVD or carotid bruit noted Lungs- Clear to ausculation bilaterally, normal work of breathing Heart- Regular rate and rhythm, no murmurs, rubs or gallops, PMI not laterally displaced Extremities- no clubbing, cyanosis, or edema Skin - no rash or ecchymosis noted  EKG-  Vent. rate 61 BPM PR interval 206 ms QRS duration 152 ms QT/QTcB 470/473 ms P-R-T axes 48 -64 3 Normal sinus rhythm Right bundle branch block Left anterior fascicular block Bifascicular block Possible Lateral infarct ,  age undetermined Abnormal ECG When compared with ECG of 07-Feb-2022 09:53, PREVIOUS ECG IS PRESENT  Echo 01/11/22-1. Left ventricular ejection fraction, by estimation, is 60 to 65%. The  left ventricle has normal function. The left ventricle has no regional  wall motion abnormalities. Left ventricular diastolic parameters are  indeterminate.   2. Right ventricular systolic function is normal. The right ventricular  size is normal. There is normal pulmonary artery systolic pressure.   3. Left atrial size was mildly dilated.   4. The mitral valve is abnormal. No evidence of mitral valve  regurgitation.  No evidence of mitral stenosis.   5. The aortic valve is normal in structure. There is moderate  calcification of the aortic valve. There is moderate thickening of the  aortic valve. Aortic valve regurgitation is not visualized. Aortic valve  sclerosis/calcification is present, without any   evidence of aortic stenosis.   6. The inferior vena cava is normal in size with greater than 50%  respiratory variability, suggesting right atrial pressure of 3 mmHg  Assessment and Plan:  1. Persistent Afib   She is currently in NSR.  Recommended to monitor rhythm with Kardiamobile device and call clinic if notes frequent Afib.  Continue amiodarone 200 mg daily. Continue diltiazem 300 mg daily.   2. CHA2DS2VASc  of at least 6 Continue eliquis 5 mg bid without interruption.  F/u as scheduled with Dr. Lalla Brothers.     Justin Mend, PA-C Afib [facility] [address] [place], [place] [address] [phone]

## 2023-04-08 NOTE — Patient Instructions (Signed)
Please call us in the next several weeks if your daily Kardiamobile device checks show frequent Afib.

## 2023-04-23 DIAGNOSIS — K219 Gastro-esophageal reflux disease without esophagitis: Secondary | ICD-10-CM | POA: Diagnosis not present

## 2023-04-23 DIAGNOSIS — E7800 Pure hypercholesterolemia, unspecified: Secondary | ICD-10-CM | POA: Diagnosis not present

## 2023-04-23 DIAGNOSIS — I4891 Unspecified atrial fibrillation: Secondary | ICD-10-CM | POA: Diagnosis not present

## 2023-04-23 DIAGNOSIS — D6869 Other thrombophilia: Secondary | ICD-10-CM | POA: Diagnosis not present

## 2023-04-23 DIAGNOSIS — I700 Atherosclerosis of aorta: Secondary | ICD-10-CM | POA: Diagnosis not present

## 2023-04-23 DIAGNOSIS — I5032 Chronic diastolic (congestive) heart failure: Secondary | ICD-10-CM | POA: Diagnosis not present

## 2023-04-23 DIAGNOSIS — I10 Essential (primary) hypertension: Secondary | ICD-10-CM | POA: Diagnosis not present

## 2023-05-05 ENCOUNTER — Ambulatory Visit (INDEPENDENT_AMBULATORY_CARE_PROVIDER_SITE_OTHER): Payer: Medicare Other | Admitting: Podiatry

## 2023-05-05 ENCOUNTER — Encounter: Payer: Self-pay | Admitting: Podiatry

## 2023-05-05 DIAGNOSIS — D689 Coagulation defect, unspecified: Secondary | ICD-10-CM | POA: Diagnosis not present

## 2023-05-05 DIAGNOSIS — M79674 Pain in right toe(s): Secondary | ICD-10-CM

## 2023-05-05 DIAGNOSIS — Q828 Other specified congenital malformations of skin: Secondary | ICD-10-CM | POA: Diagnosis not present

## 2023-05-05 DIAGNOSIS — M79675 Pain in left toe(s): Secondary | ICD-10-CM

## 2023-05-05 DIAGNOSIS — B351 Tinea unguium: Secondary | ICD-10-CM

## 2023-05-09 DIAGNOSIS — I872 Venous insufficiency (chronic) (peripheral): Secondary | ICD-10-CM | POA: Diagnosis not present

## 2023-05-09 DIAGNOSIS — L821 Other seborrheic keratosis: Secondary | ICD-10-CM | POA: Diagnosis not present

## 2023-05-09 DIAGNOSIS — L820 Inflamed seborrheic keratosis: Secondary | ICD-10-CM | POA: Diagnosis not present

## 2023-05-09 DIAGNOSIS — I8311 Varicose veins of right lower extremity with inflammation: Secondary | ICD-10-CM | POA: Diagnosis not present

## 2023-05-09 DIAGNOSIS — I8312 Varicose veins of left lower extremity with inflammation: Secondary | ICD-10-CM | POA: Diagnosis not present

## 2023-05-09 DIAGNOSIS — D225 Melanocytic nevi of trunk: Secondary | ICD-10-CM | POA: Diagnosis not present

## 2023-05-09 DIAGNOSIS — D1801 Hemangioma of skin and subcutaneous tissue: Secondary | ICD-10-CM | POA: Diagnosis not present

## 2023-05-09 DIAGNOSIS — L718 Other rosacea: Secondary | ICD-10-CM | POA: Diagnosis not present

## 2023-05-11 NOTE — Progress Notes (Signed)
  Subjective:  Patient ID: Dorothy Powers, female    DOB: 1945/12/24,  MRN: [id number]  Dorothy Powers presents to clinic today for: at risk foot care with h/o coagulation defect and painful porokeratotic lesions of both feet. Pain prevent(s) comfortable ambulation. Aggravating factor is weightbearing with and without shoegear.    PCP is Joycelyn Rua, MD.  Allergies  Allergen Reactions   Codeine Other (See Comments)    "Couldn't move one night for a short time, after taking the medicaiton"   Lovastatin Other (See Comments)    Leg pain   Sulfa Antibiotics Other (See Comments)    "Felt weak"    Review of Systems: Negative except as noted in the HPI.  Objective: No changes noted in today's physical examination. There were no vitals filed for this visit.  Dorothy Powers is a pleasant 77 y.o. female in NAD. AAO x 3.  Vascular Examination: Capillary refill time <3 seconds b/l LE. Palpable pedal pulses b/l LE. Digital hair absent b/l. No pedal edema b/l. Skin temperature gradient WNL b/l. No varicosities b/l. No cyanosis or clubbing noted b/l LE.Marland Kitchen  Dermatological Examination: Pedal skin with normal turgor, texture and tone b/l. No open wounds. No interdigital macerations b/l. Toenails 1-5 b/l elongated, discolored, dystrophic, thickened, crumbly with subungual debris and tenderness to dorsal palpation. Porokeratotic lesion(s) R 3rd toe, submet head 2 right foot, submet head 5 left foot, and submet head 5 right foot. No erythema, no edema, no drainage, no fluctuance..  Neurological Examination: Protective sensation intact with 10 gram monofilament b/l LE. Vibratory sensation intact b/l LE.   Musculoskeletal Examination: Hammertoe(s) noted to the 2-5 bilaterally.  Assessment/Plan: 1. Pain due to onychomycosis of toenails of both feet   2. Porokeratosis   3. Coagulation defect [facility])     -Patient was evaluated today. All questions/concerns addressed on today's  visit. -Continue supportive shoe gear daily. -Toenails 1-5 b/l were debrided in length and girth with sterile nail nippers and dremel without iatrogenic bleeding.  -Corn(s) R 3rd toe pared utilizing sharp debridement with sterile blade without complication or incident. Total number debrided=1. -Porokeratotic lesion(s) submet head 2 right foot and submet head 5 b/l pared and enucleated with sterile currette without incident. Total number of lesions debrided=3. -Patient/POA to call should there be question/concern in the interim.   Return in about 3 months (around 08/05/2023).  Dorothy Powers, DPM

## 2023-05-21 DIAGNOSIS — Z961 Presence of intraocular lens: Secondary | ICD-10-CM | POA: Diagnosis not present

## 2023-05-21 DIAGNOSIS — H02834 Dermatochalasis of left upper eyelid: Secondary | ICD-10-CM | POA: Diagnosis not present

## 2023-05-21 DIAGNOSIS — H43811 Vitreous degeneration, right eye: Secondary | ICD-10-CM | POA: Diagnosis not present

## 2023-05-21 DIAGNOSIS — H02831 Dermatochalasis of right upper eyelid: Secondary | ICD-10-CM | POA: Diagnosis not present

## 2023-05-21 DIAGNOSIS — H1045 Other chronic allergic conjunctivitis: Secondary | ICD-10-CM | POA: Diagnosis not present

## 2023-05-21 DIAGNOSIS — H04123 Dry eye syndrome of bilateral lacrimal glands: Secondary | ICD-10-CM | POA: Diagnosis not present

## 2023-05-23 ENCOUNTER — Other Ambulatory Visit: Payer: Self-pay | Admitting: Student

## 2023-06-04 ENCOUNTER — Ambulatory Visit: Payer: Medicare Other | Attending: Cardiology | Admitting: Cardiology

## 2023-06-04 ENCOUNTER — Encounter: Payer: Self-pay | Admitting: Cardiology

## 2023-06-04 VITALS — BP 146/64 | HR 69 | Ht 62.0 in | Wt 193.6 lb

## 2023-06-04 DIAGNOSIS — I4819 Other persistent atrial fibrillation: Secondary | ICD-10-CM | POA: Diagnosis not present

## 2023-06-04 DIAGNOSIS — I5032 Chronic diastolic (congestive) heart failure: Secondary | ICD-10-CM | POA: Diagnosis not present

## 2023-06-04 DIAGNOSIS — Z79899 Other long term (current) drug therapy: Secondary | ICD-10-CM | POA: Diagnosis not present

## 2023-06-04 NOTE — Progress Notes (Signed)
Electrophysiology Office Follow up Visit Note:    Date:  06/04/2023   ID:  Dorothy Powers, DOB 1945/09/05, MRN [id number]  PCP:  Joycelyn Rua, MD  [facility] Cardiologist:  Meriam Sprague, MD (Inactive)  [facility] Electrophysiologist:  Lanier Prude, MD    Interval History:     Dorothy Powers is a 77 y.o. female who presents for a follow up visit.   Discussed the use of AI scribe software for clinical note transcription with the patient, who gave verbal consent to proceed.  [name] is presenting for follow-up.  She last saw Jonny Ruiz in the A-fib clinic on April 08, 2023.  I last saw her June 10, 2022.  At that appointment she was maintaining sinus rhythm on amiodarone. At the appointment with Jonny Ruiz and she reported a recent episode of probable atrial fibrillation with palpitations and irregularity of the heartbeat.  She purchased a Kardia mobile but had not set it up yet.   History of Present Illness   The patient, with a history of cardiac arrhythmia managed with Eliquis and amiodarone, reports a recent episode of irregular heart rhythm lasting two to three days. She describes the sensation as 'something not normal' in her chest. By the time she was evaluated at the clinic, the rhythm had returned to normal. She has been monitoring her heart rhythm at home with a Cardia device, which consistently reports a wide normal sinus rhythm.  The patient also reports slightly elevated blood pressure readings at home, with systolic pressures in the 120s to 130s, and occasionally over 140. She has been managing her sodium intake carefully, mostly avoiding added sodium and eating out, which she notices makes a difference in her symptoms.  She also mentions taking a diuretic, which she prefers to take at night, and has noticed occasional ankle swelling.            Past medical, surgical, social and family history were reviewed.  ROS:   Please see the  history of present illness.    All other systems reviewed and are negative.  EKGs/Labs/Other Studies Reviewed:    The following studies were reviewed today:  January 31, 2022 echo shows an EF of 60-65, normal RV, small pericardial effusion, no MR   EKG Interpretation Date/Time:  Wednesday June 04 2023 14:29:21 EST Ventricular Rate:  69 PR Interval:  224 QRS Duration:  146 QT Interval:  468 QTC Calculation: 501 R Axis:   -32  Text Interpretation: Sinus rhythm with 1st degree A-V block Right bundle branch block Left anterior fascicular block Confirmed by Steffanie Dunn [phone]) on 06/04/2023 2:49:43 PM    Physical Exam:    VS:  BP (!) 146/64   Pulse 69   Ht 5\' 2"  (1.575 m)   Wt 193 lb 9.6 oz (87.8 kg)   SpO2 96%   BMI 35.41 kg/m     Wt Readings from Last 3 Encounters:  06/04/23 193 lb 9.6 oz (87.8 kg)  04/08/23 191 lb 6.4 oz (86.8 kg)  12/10/22 189 lb 12.8 oz (86.1 kg)     Physical Exam   GENERAL: Overweight, no distress. CHEST: Lungs clear to auscultation. CARDIOVASCULAR: Regular rate and rhythm. EXTREMITIES: Trace pitting edema in lower extremities.          ASSESSMENT:    1. Persistent atrial fibrillation (HCC)   2. Encounter for long-term (current) use of high-risk medication   3. Chronic heart failure with preserved ejection fraction (HCC)  PLAN:    In order of problems listed above:  Assessment and Plan    Atrial Fibrillation Patient reported a few days of irregular rhythm, but it resolved before evaluation. Currently asymptomatic and maintaining on Amiodarone and Eliquis. -Continue Amiodarone and Eliquis. -Order Amiodarone blood work.  Hypertension Home blood pressure readings mostly in the 120s-130s systolic, with occasional readings in the 140s. -Continue current management. -Advise patient to continue home blood pressure monitoring.  Chronic diastolic heart failure / Edema Trace pitting edema in lower extremities noted on physical  exam. Patient reports occasional swelling, possibly related to dietary sodium intake. -Advise patient to continue monitoring sodium intake, focusing on added sodium in foods and restaurant meals. -Continue current diuretic regimen as tolerated.  Follow-up -Schedule follow-up appointment in 6 months with an APP. -Order CMP, TSH, and free T4.          Signed, Steffanie Dunn, MD, [facility], [facility] 06/04/2023 2:58 PM    Electrophysiology [facility]

## 2023-06-04 NOTE — Patient Instructions (Signed)
Medication Instructions:  Your physician recommends that you continue on your current medications as directed. Please refer to the Current Medication list given to you today.  *If you need a refill on your cardiac medications before your next appointment, please call your pharmacy*   Lab Work: TODAY: CMET, TSH, T4  If you have labs (blood work) drawn today and your tests are completely normal, you will receive your results only by: MyChart Message (if you have MyChart) OR A paper copy in the mail If you have any lab test that is abnormal or we need to change your treatment, we will call you to review the results.   Follow-Up: At [facility], you and your health needs are our priority.  As part of our continuing mission to provide you with exceptional heart care, we have created designated Provider Care Teams.  These Care Teams include your primary Cardiologist (physician) and Advanced Practice Providers (APPs -  Physician Assistants and Nurse Practitioners) who all work together to provide you with the care you need, when you need it.  Your next appointment:   6 months  Provider:   You will see one of the following Advanced Practice Providers on your designated Care Team:   Francis Dowse, Charlott Holler [address]" [place], [place] Sherie Don, NP Canary Brim, NP

## 2023-06-05 LAB — COMPREHENSIVE METABOLIC PANEL
ALT: 26 [IU]/L (ref 0–32)
AST: 27 [IU]/L (ref 0–40)
Albumin: 4.5 g/dL (ref 3.8–4.8)
Alkaline Phosphatase: 140 [IU]/L — ABNORMAL HIGH (ref 44–121)
BUN/Creatinine Ratio: 23 (ref 12–28)
BUN: 18 mg/dL (ref 8–27)
Bilirubin Total: 0.4 mg/dL (ref 0.0–1.2)
CO2: 29 mmol/L (ref 20–29)
Calcium: 9.5 mg/dL (ref 8.7–10.3)
Chloride: 97 mmol/L (ref 96–106)
Creatinine, Ser: 0.79 mg/dL (ref 0.57–1.00)
Globulin, Total: 2.4 g/dL (ref 1.5–4.5)
Glucose: 91 mg/dL (ref 70–99)
Potassium: 4.2 mmol/L (ref 3.5–5.2)
Sodium: 139 mmol/L (ref 134–144)
Total Protein: 6.9 g/dL (ref 6.0–8.5)
eGFR: 76 mL/min/{1.73_m2} (ref >59)

## 2023-06-05 LAB — TSH: TSH: 4.61 u[IU]/mL — ABNORMAL HIGH (ref 0.450–4.500)

## 2023-06-05 LAB — T4, FREE: Free T4: 1.23 ng/dL (ref 0.82–1.77)

## 2023-06-11 DIAGNOSIS — Z01419 Encounter for gynecological examination (general) (routine) without abnormal findings: Secondary | ICD-10-CM | POA: Diagnosis not present

## 2023-06-19 DIAGNOSIS — Z23 Encounter for immunization: Secondary | ICD-10-CM | POA: Diagnosis not present

## 2023-07-17 ENCOUNTER — Ambulatory Visit (INDEPENDENT_AMBULATORY_CARE_PROVIDER_SITE_OTHER): Payer: Medicare Other | Admitting: Podiatry

## 2023-07-17 ENCOUNTER — Encounter: Payer: Self-pay | Admitting: Podiatry

## 2023-07-17 DIAGNOSIS — D689 Coagulation defect, unspecified: Secondary | ICD-10-CM

## 2023-07-17 DIAGNOSIS — M79674 Pain in right toe(s): Secondary | ICD-10-CM | POA: Diagnosis not present

## 2023-07-17 DIAGNOSIS — L84 Corns and callosities: Secondary | ICD-10-CM | POA: Diagnosis not present

## 2023-07-17 DIAGNOSIS — Q828 Other specified congenital malformations of skin: Secondary | ICD-10-CM

## 2023-07-17 DIAGNOSIS — B351 Tinea unguium: Secondary | ICD-10-CM | POA: Diagnosis not present

## 2023-07-17 DIAGNOSIS — M79675 Pain in left toe(s): Secondary | ICD-10-CM

## 2023-07-21 NOTE — Progress Notes (Signed)
  Subjective:  Patient ID: Dorothy Powers, female    DOB: 1946-02-10,  MRN: [id number]  78 y.o. female presents at risk foot care with h/o coagulation defect and painful porokeratotic lesion(s) b/l feet and painful mycotic toenails that limit ambulation. Painful toenails interfere with ambulation. Aggravating factors include wearing enclosed shoe gear. Pain is relieved with periodic professional debridement. Painful porokeratotic lesions are aggravated when weightbearing with and without shoegear. Pain is relieved with periodic professional debridement.  New problem(s): None   PCP is Dorothy Senior, MD , and last visit was April 23, 2023.  Allergies  Allergen Reactions   Codeine Other (See Comments)    Couldn't move one night for a short time, after taking the medicaiton   Lovastatin Other (See Comments)    Leg pain   Sulfa Antibiotics Other (See Comments)    Felt weak    Review of Systems: Negative except as noted in the HPI.   Objective:  Dorothy Powers is a pleasant 78 y.o. female in NAD. AAO x 3.  Vascular Examination: Vascular status intact b/l with palpable pedal pulses. CFT immediate b/l. Pedal hair absent. No edema. No pain with calf compression b/l. Skin temperature gradient WNL b/l. No varicosities noted. No cyanosis or clubbing noted.  Neurological Examination: Sensation grossly intact b/l with 10 gram monofilament. Vibratory sensation intact b/l.  Dermatological Examination: Pedal skin with normal turgor, texture and tone b/l. No open wounds nor interdigital macerations noted. Toenails 1-5 b/l thick, discolored, elongated with subungual debris and pain on dorsal palpation.   Porokeratotic lesion(s) right third digit, submet head 2 right foot, submet head 5 left foot, and submet head 5 right foot. No erythema, no edema, no drainage, no fluctuance.  Musculoskeletal Examination: Muscle strength 5/5 to b/l LE.  No pain, crepitus noted b/l.Hammertoe deformity  noted 2-5 b/l.  Radiographs: None  Last A1c:       No data to display          Assessment:   1. Pain due to onychomycosis of toenails of both feet   2. Porokeratosis   3. Corns   4. Coagulation defect [facility])    Plan:  -Patient was evaluated today. All questions/concerns addressed on today's visit. -Mycotic toenails 1-5 bilaterally were debrided in length and girth with sterile nail nippers and dremel without incident. -Corn(s) right third digit pared utilizing sterile scalpel blade without complication or incident. Total number debrided=1. -Porokeratotic lesion(s) submet head 2 right foot, submet head 5 left foot, and submet head 5 right foot pared and enucleated with sterile currette without incident. Total number of lesions debrided=3. -Patient/POA to call should there be question/concern in the interim.  Return in about 9 weeks (around 09/18/2023).  [name], DPM       LOCATION: [address], [place] [address]                   Office [phone]   [facility] LOCATION: [address] [place], [place] [address] Office [phone]

## 2023-07-22 DIAGNOSIS — H1045 Other chronic allergic conjunctivitis: Secondary | ICD-10-CM | POA: Diagnosis not present

## 2023-07-22 DIAGNOSIS — J310 Chronic rhinitis: Secondary | ICD-10-CM | POA: Diagnosis not present

## 2023-07-22 DIAGNOSIS — R052 Subacute cough: Secondary | ICD-10-CM | POA: Diagnosis not present

## 2023-07-22 DIAGNOSIS — J300 Vasomotor rhinitis: Secondary | ICD-10-CM | POA: Diagnosis not present

## 2023-08-11 ENCOUNTER — Encounter: Payer: Self-pay | Admitting: Podiatry

## 2023-08-11 ENCOUNTER — Ambulatory Visit (INDEPENDENT_AMBULATORY_CARE_PROVIDER_SITE_OTHER): Payer: Medicare Other | Admitting: Podiatry

## 2023-08-11 DIAGNOSIS — M2041 Other hammer toe(s) (acquired), right foot: Secondary | ICD-10-CM

## 2023-08-11 NOTE — Progress Notes (Signed)
She presents today concerned that she has an ingrown toenail to the lateral border fourth toe right foot.  She states that sometimes the callus will grow up and I have to trim it.  She is also concerned about her second digit of her left foot.  She states that it will not lay down and it hurts on the bottom and is leaning toward my big toe.  She states that it under rides her big toe and she is walking on the toe the second toe itself.  She does have past medical history consistent with A-fib and is currently on amiodarone as well as Eliquis twice daily.  Objective: Fourth toe does demonstrate a slight area of reactive hyperkeratosis lateral border does not demonstrate ingrown toenail at this time.  I debrided that area today relieving her symptoms there.  Severe rigid hammertoe deformity with I am completely dislocated second metatarsal phalangeal joint.  She also has some hammertoe deformities #3 #4 #5.  Pain on palpation of the plantar aspect of the second metatarsal head.  Assessment: Plantarflexed second metatarsal dislocation hammertoe deformity second right medial deviation.  Most likely a tear of the plantar capsule.  Reactive benign skin lesion lateral fourth nail border right  Plan: I will send her to Dr. Lilian Kapur for possible second met osteotomy hammertoe repair with plantar plate repair.

## 2023-08-18 ENCOUNTER — Encounter: Payer: Self-pay | Admitting: Podiatry

## 2023-08-18 ENCOUNTER — Ambulatory Visit (INDEPENDENT_AMBULATORY_CARE_PROVIDER_SITE_OTHER): Payer: Medicare Other

## 2023-08-18 ENCOUNTER — Ambulatory Visit (INDEPENDENT_AMBULATORY_CARE_PROVIDER_SITE_OTHER): Payer: Medicare Other | Admitting: Podiatry

## 2023-08-18 VITALS — Ht 62.0 in | Wt 193.6 lb

## 2023-08-18 DIAGNOSIS — M7741 Metatarsalgia, right foot: Secondary | ICD-10-CM | POA: Diagnosis not present

## 2023-08-18 DIAGNOSIS — M21961 Unspecified acquired deformity of right lower leg: Secondary | ICD-10-CM

## 2023-08-18 DIAGNOSIS — M19071 Primary osteoarthritis, right ankle and foot: Secondary | ICD-10-CM | POA: Diagnosis not present

## 2023-08-18 DIAGNOSIS — M2041 Other hammer toe(s) (acquired), right foot: Secondary | ICD-10-CM

## 2023-08-18 NOTE — Progress Notes (Signed)
Subjective:  Patient ID: Dorothy Powers, female    DOB: Nov 13, 1945,  MRN: [id number]  Chief Complaint  Patient presents with   Hammer Toe    Pt is here for surgery consult due to hammer toes on right foot.    78 y.o. female presents with the above complaint. History confirmed with patient.  She had bunion surgery a few years ago and things were doing well at her last visit, the big toe has started to contract and feels like sometimes it does not even touch the ground her biggest site of pain is under the second toe and outside of the foot on the fifth toe where there is a large painful callus under the bone.  Objective:  Physical Exam: warm, good capillary refill, no trophic changes or ulcerative lesions, normal DP and PT pulses, normal sensory exam, and she does have varicose veins, she has hallux extensus contraction with dorsiflexion limited range of motion and crepitance on attempted range of motion of the first MTP joint she has prominent second and fifth metatarsal heads with hyperkeratotic painful lesions and digital contractures that are semirigid   Radiographs: Multiple views x-ray of the right foot: New radiographs taken today show end-stage arthrosis of the first MTP joint, elongated second ray and tailor's bunion deformity no evidence of infection or osteomyelitis digital contractures noted Assessment:   1. Hammertoe of right foot   2. Metatarsal deformity, right   3. Osteoarthritis of first metatarsophalangeal (MTP) joint of right foot      Plan:  Patient was evaluated and treated and all questions answered.   Discussed the etiology and treatment including surgical and non surgical treatment for her painful great toe, metatarsalgia and hammertoes.  She has exhausted all non surgical treatment prior to this visit including shoe gear changes and padding.  She desires surgical intervention. We discussed all risks including but not limited to: pain, swelling, infection,  scar, numbness which may be temporary or permanent, chronic pain, stiffness, nerve pain or damage, wound healing problems, bone healing problems including delayed or non-union and recurrence. Specifically we discussed the following procedures: First MTP fusion with pan metatarsal head resection and hammertoe correction of second third fourth and fifth toes use of cadaver bone and bone graft from the heel and bone marrow aspiration from the leg. Informed consent was signed today. Surgery will be scheduled at a mutually agreeable date. Information regarding this will be forwarded to our surgery scheduler.  She will need cardiology clearance and have a pause in her Eliquis use which I will defer management of to them but we discussed that typically was 2 days prior to surgery and restart the day after surgery we will plan to have her admitted postop for observation for 24 hours before returning to home.  We discussed the period of nonweightbearing for approximately 4 to 6 weeks in a cam boot and then weightbearing in the boot for another 4 to 6 weeks.  Had a recent bone density test that was normal.  Will check vitamin D and calcium level prior to surgery as part of preoperative lab work   Surgical plan:  Procedure: -Right First MTP fusion with pan metatarsal head resection and hammertoe correction of second third fourth and fifth toes use of cadaver bone and bone graft from the heel and bone marrow aspiration from the leg  Location: -[facility]  Anesthesia plan: -Sedation with regional block  Postoperative pain plan: - Tylenol 1000 mg every 6 hours,  gabapentin 300  mg every 8 hours x5 days, oxycodone 5 mg 1-2 tabs every 6 hours only as needed  DVT prophylaxis: -She will restart Eliquis postop the day after  WB Restrictions / DME needs: -Nonweightbearing in CAM boot    No follow-ups on file.

## 2023-08-19 ENCOUNTER — Encounter: Payer: Self-pay | Admitting: Podiatry

## 2023-08-20 ENCOUNTER — Telehealth (HOSPITAL_BASED_OUTPATIENT_CLINIC_OR_DEPARTMENT_OTHER): Payer: Self-pay | Admitting: *Deleted

## 2023-08-20 NOTE — Telephone Encounter (Signed)
   Pre-operative Risk Assessment    Patient Name: Dorothy Powers  DOB: 01/15/1946 MRN: [id number]   Date of last office visit: 06/04/2023 Date of next office visit: None   Request for Surgical Clearance    Procedure:   Right First MTP fusion with pan metartarsal head resection and hammer toe correction of second third fourth and fifth toes use of cadaver bone and bone graft from the heel and bone arrow aspiration from the leg  Date of Surgery:  Clearance TBD                                 Surgeon:  Dr. Juliene Medicine Surgeon's Group or Practice Name:  [facility]. Phone number:  [phone] Fax number:  [phone]   Type of Clearance Requested:   - Medical  - Pharmacy:  Hold Apixaban  (Eliquis ) Not indicated.   Type of Anesthesia:  General    Additional requests/questions:    Signed, Edsel Grayce Sanders   08/20/2023, 9:40 AM

## 2023-08-20 NOTE — Telephone Encounter (Signed)
 Pharmacy please advise on holding Eliquis  prior to right First MTP fusion with pan metatarsal head resection and hammertoe correction of second, third and fifth toes scheduled for TBD. Thank you.

## 2023-08-21 NOTE — Telephone Encounter (Signed)
 Patient with diagnosis of afib on Eliquis  for anticoagulation.    Procedure: Right First MTP fusion with pan metartarsal head resection and hammer toe correction of second third fourth and fifth toes use of cadaver bone and bone graft from the heel and bone arrow aspiration from the leg  Date of procedure: 10/03/23   CHA2DS2-VASc Score = 7   This indicates a 11.2% annual risk of stroke. The patient's score is based upon: CHF History: 1 HTN History: 1 Diabetes History: 0 Stroke History: 2 Vascular Disease History: 0 Age Score: 2 Gender Score: 1      CrCl 58 ml/min Platelet count 318  Per office protocol, patient can hold Eliquis  for 2 days prior to procedure.    **This guidance is not considered finalized until pre-operative APP has relayed final recommendations.**

## 2023-08-22 ENCOUNTER — Telehealth: Payer: Self-pay | Admitting: *Deleted

## 2023-08-22 NOTE — Telephone Encounter (Signed)
 Pt has been scheduled tele preop appt 09/04/23. Med rec and consent are done.

## 2023-08-22 NOTE — Telephone Encounter (Signed)
   Name: Dorothy Powers  DOB: 1946-04-08  MRN: [id number]  Primary Cardiologist: None   Preoperative team, please contact this patient and set up a phone call appointment for further preoperative risk assessment. Please obtain consent and complete medication review. Thank you for your help.  I confirm that guidance regarding antiplatelet and oral anticoagulation therapy has been completed and, if necessary, noted below.  Per office protocol, patient can hold Eliquis  for 2 days prior to procedure.   I also confirmed the patient resides in the state of [place] . As per [facility] Medical Board telemedicine laws, the patient must reside in the state in which the provider is licensed.   Josefa CHRISTELLA Beauvais, NP 08/22/2023, 8:53 AM [facility]

## 2023-08-22 NOTE — Telephone Encounter (Signed)
 Pt has been scheduled tele preop appt 09/04/23. Med rec and consent are done.      Patient Consent for Virtual Visit        Dorothy Powers has provided verbal consent on 08/22/2023 for a virtual visit (video or telephone).   CONSENT FOR VIRTUAL VISIT FOR:  Dorothy Powers  By participating in this virtual visit I agree to the following:  I hereby voluntarily request, consent and authorize [facility] and its employed or contracted physicians, physician assistants, nurse practitioners or other licensed health care professionals (the Practitioner), to provide me with telemedicine health care services (the "Services) as deemed necessary by the treating Practitioner. I acknowledge and consent to receive the Services by the Practitioner via telemedicine. I understand that the telemedicine visit will involve communicating with the Practitioner through live audiovisual communication technology and the disclosure of certain medical information by electronic transmission. I acknowledge that I have been given the opportunity to request an in-person assessment or other available alternative prior to the telemedicine visit and am voluntarily participating in the telemedicine visit.  I understand that I have the right to withhold or withdraw my consent to the use of telemedicine in the course of my care at any time, without affecting my right to future care or treatment, and that the Practitioner or I may terminate the telemedicine visit at any time. I understand that I have the right to inspect all information obtained and/or recorded in the course of the telemedicine visit and may receive copies of available information for a reasonable fee.  I understand that some of the potential risks of receiving the Services via telemedicine include:  Delay or interruption in medical evaluation due to technological equipment failure or disruption; Information transmitted may not be sufficient (e.g. poor  resolution of images) to allow for appropriate medical decision making by the Practitioner; and/or  In rare instances, security protocols could fail, causing a breach of personal health information.  Furthermore, I acknowledge that it is my responsibility to provide information about my medical history, conditions and care that is complete and accurate to the best of my ability. I acknowledge that Practitioner's advice, recommendations, and/or decision may be based on factors not within their control, such as incomplete or inaccurate data provided by me or distortions of diagnostic images or specimens that may result from electronic transmissions. I understand that the practice of medicine is not an exact science and that Practitioner makes no warranties or guarantees regarding treatment outcomes. I acknowledge that a copy of this consent can be made available to me via my patient portal [facility] MyChart), or I can request a printed copy by calling the office of [facility].    I understand that my insurance will be billed for this visit.   I have read or had this consent read to me. I understand the contents of this consent, which adequately explains the benefits and risks of the Services being provided via telemedicine.  I have been provided ample opportunity to ask questions regarding this consent and the Services and have had my questions answered to my satisfaction. I give my informed consent for the services to be provided through the use of telemedicine in my medical care

## 2023-09-03 NOTE — Progress Notes (Unsigned)
 Virtual Visit via Telephone Note   Because of HIDEKO Powers co-morbid illnesses, she is at least at moderate risk for complications without adequate follow up.  This format is felt to be most appropriate for this patient at this time.  Due to technical limitations with video connection Web designer), today's appointment will be conducted as an audio only telehealth visit, and Dorothy Powers verbally agreed to proceed in this manner.   All issues noted in this document were discussed and addressed.  No physical exam could be performed with this format.  Evaluation Performed:  Preoperative cardiovascular risk assessment _____________   Date:  09/03/2023   Patient ID:  Dorothy Powers, DOB 12-04-1945, MRN [id number] Patient Location:  Home Provider location:   Office  Primary Care Provider:  Joycelyn Rua, MD Primary Cardiologist:  None  Chief Complaint / Patient Profile   78 y.o. y/o female with a h/o *** who is pending *** and presents today for telephonic preoperative cardiovascular risk assessment.  History of Present Illness    Dorothy Powers is a 78 y.o. female who presents via audio/video conferencing for a telehealth visit today.  Pt was last seen in cardiology clinic on *** by ***.  At that time Dorothy Powers was doing well ***.  The patient is now pending procedure as outlined above. Since her last visit, she ***  Past Medical History    Past Medical History:  Diagnosis Date   Hypertension    Meningioma (HCC)    Tachycardia    TIA (transient ischemic attack)    Past Surgical History:  Procedure Laterality Date   ABDOMINAL HYSTERECTOMY     BUNIONECTOMY Right 11/2020   CARDIOVERSION N/A 02/01/2022   Procedure: CARDIOVERSION;  Surgeon: Chilton Si, MD;  Location: [facility] ENDOSCOPY;  Service: Cardiovascular;  Laterality: N/A;    Allergies  Allergies  Allergen Reactions   Codeine Other (See Comments)    "Couldn't move one night for a short  time, after taking the medicaiton"   Lovastatin Other (See Comments)    Leg pain   Sulfa Antibiotics Other (See Comments)    "Felt weak"    Home Medications    Prior to Admission medications   Medication Sig Start Date End Date Taking? Authorizing Provider  albuterol (VENTOLIN HFA) 108 (90 Base) MCG/ACT inhaler Inhale 1-2 puffs into the lungs every 6 (six) hours as needed for wheezing.    [provider]  amiodarone (PACERONE) 200 MG tablet Take 1 tablet by mouth once daily 02/26/23   Graciella Freer, PA-C  apixaban Everlene Balls) 5 MG TABS tablet Take 1 tablet by mouth twice daily 02/26/23   Lanier Prude, MD  Ascorbic Acid (VITAMIN C PO) Take 1 tablet by mouth daily.    [provider]  azelastine (ASTELIN) 0.1 % nasal spray Place 2 sprays into both nostrils 2 (two) times daily. 08/02/20   [provider]  CALCIUM-VITAMIN D PO Take 2 tablets by mouth at bedtime.    [provider]  cetirizine (ZYRTEC) 10 MG tablet Take 10 mg by mouth daily.    [provider]  Cholecalciferol (VITAMIN D3) 125 MCG (5000 UT) TABS Take 5,000 Units by mouth daily.    [provider]  Coenzyme Q10 (CO Q 10 PO) Take 200 mg by mouth daily.    [provider]  diltiazem [facility]) 300 MG 24 hr capsule Take 1 capsule by mouth once daily 02/26/23   Graciella Freer, PA-C  diphenhydrAMINE (BENADRYL) 25 MG tablet Take 25-50 mg by mouth as needed for allergies.    [provider]  doxycycline (VIBRAMYCIN) 50 MG capsule Take 50 mg by mouth daily.    [provider]  DOXYCYCLINE PO Take 50 mg by mouth daily.    [provider]  ezetimibe (ZETIA) 10 MG tablet Take 10 mg by mouth daily. 06/27/20   [provider]  furosemide (LASIX) 40 MG tablet Take 1 tablet by mouth once daily 02/26/23   Graciella Freer, PA-C  glucosamine-chondroitin 500-400 MG tablet Take 1 tablet by mouth in the morning and at bedtime.  Triple strength    [provider]  ipratropium (ATROVENT) 0.03 % nasal spray 1-2 sprays in each nostril Nasally 4 times a day prn for 30 days 07/22/23   [provider]  pantoprazole (PROTONIX) 40 MG tablet Take 40 mg by mouth daily. 09/20/20   [provider]  Polyethyl Glycol-Propyl Glycol (SYSTANE OP) Place 2 drops into both eyes 2 (two) times daily.    [provider]  potassium chloride SA (KLOR-CON M20) 20 MEQ tablet Take 1 tablet by mouth once daily 05/26/23   Graciella Freer, PA-C  rosuvastatin (CRESTOR) 10 MG tablet Take 10 mg by mouth daily.    [provider]    Physical Exam    Vital Signs:  Dorothy Powers does not have vital signs available for review today.***  Given telephonic nature of communication, physical exam is limited. AAOx3. NAD. Normal affect.  Speech and respirations are unlabored.  Accessory Clinical Findings    None  Assessment & Plan    1.  Preoperative Cardiovascular Risk Assessment:  The patient was advised that if she develops new symptoms prior to surgery to contact our office to arrange for a follow-up visit, and she verbalized understanding.  (Reminder: Include SBE prophylaxis/Antiplatelet/Anticoag Instructions***)  A copy of this note will be routed to requesting surgeon.  Time:   Today, I have spent *** minutes with the patient with telehealth technology discussing medical history, symptoms, and management plan.     Joni Reining, NP  09/03/2023, 1:59 PM

## 2023-09-04 ENCOUNTER — Ambulatory Visit (INDEPENDENT_AMBULATORY_CARE_PROVIDER_SITE_OTHER): Payer: Medicare Other

## 2023-09-04 NOTE — Patient Instructions (Signed)
 Patient  needs new patient appointment.

## 2023-09-04 NOTE — Progress Notes (Signed)
 Patient needs new patient appointment please.   Joni Reining, NP  09/04/2023, 12:11 PM

## 2023-09-04 NOTE — Telephone Encounter (Signed)
 Per preop APP Joni Reining, DNP this pt is going to need to be set up as NEW PT with Gen Card for preop clearance. I have reached out to our scheduling team to set up new pt appt per preop APP today.

## 2023-09-04 NOTE — Progress Notes (Signed)
 Per preop APP Joni Reining, DNP this pt is going to need to be set up as NEW PT with Gen Card for preop clearance. I have reached out to our scheduling team to set up new pt appt per preop APP today.

## 2023-09-08 ENCOUNTER — Encounter: Payer: Self-pay | Admitting: Cardiology

## 2023-09-09 NOTE — Telephone Encounter (Signed)
 Per our scheduling team the pt has been scheduled to see Dr. Eden Emms as new pt appt 09/11/23 for preop clearance. Per preop APP Robin Searing, NP and Joni Reining, DNP pt needs to new pt appt with gen card as she only see's EP.     I will update all parties involved.

## 2023-09-09 NOTE — Progress Notes (Unsigned)
 CARDIOLOGY CONSULT NOTE       Patient ID: Dorothy Powers MRN: [id number] DOB/AGE: 09/20/1945 78 y.o.  Admit date: (Not on file) Referring Physician: Sharl Ma Primary Physician: Joycelyn Rua, MD Primary Cardiologist: Derrek Monaco Reason for Consultation: Preoperative   Active Problems:   * No active hospital problems. *   HPI:  78 y.o. added on to DOD schedule for preoperative consult. Usually follows with EP for PAF. She has been in NSR and maintained on amiodarone, cardizem and eliquis. Seen by Dr Lalla Brothers 06/04/23 ? Brief episodes PAF no monitor ordered Patient to get [place] monitor. CHADVASC 6.  No history of chest pain, syncope, dyspnea. Needs right foot surgery for hammer toe. TTE done 01/31/22 EF 60-65% small pericardial effusion. No valve disease. History of HTN. And meningioma. Last MRI head 09/05/22 stable left frontal convexity 3 x 3.7 She was in NSR with RBBB on ECG 06/04/23 when seeing Dr Lalla Brothers.   ***  ROS All other systems reviewed and negative except as noted above  Past Medical History:  Diagnosis Date   Hypertension    Meningioma (HCC)    Tachycardia    TIA (transient ischemic attack)     Family History  Problem Relation Age of Onset   Heart disease Mother        pacemaker   Heart attack Father     Social History   Socioeconomic History   Marital status: Married    Spouse name: Not on file   Number of children: 0   Years of education: college   Highest education level: Bachelor's degree (e.g., BA, AB, BS)  Occupational History   Occupation: Retired  Tobacco Use   Smoking status: Former    Types: Cigarettes   Smokeless tobacco: Never  Substance and Sexual Activity   Alcohol use: Yes    Comment: social   Drug use: Never   Sexual activity: Not on file  Other Topics Concern   Not on file  Social History Narrative   Lives at home with husband.   Right-handed.   No daily use of caffeine.   Social Drivers of Research scientist (physical sciences) Strain: Not on file  Food Insecurity: Not on file  Transportation Needs: Not on file  Physical Activity: Not on file  Stress: Not on file  Social Connections: Not on file  Intimate Partner Violence: Not on file    Past Surgical History:  Procedure Laterality Date   ABDOMINAL HYSTERECTOMY     BUNIONECTOMY Right 11/2020   CARDIOVERSION N/A 02/01/2022   Procedure: CARDIOVERSION;  Surgeon: Chilton Si, MD;  Location: [facility] ENDOSCOPY;  Service: Cardiovascular;  Laterality: N/A;      Current Outpatient Medications:    albuterol (VENTOLIN HFA) 108 (90 Base) MCG/ACT inhaler, Inhale 1-2 puffs into the lungs every 6 (six) hours as needed for wheezing., Disp: , Rfl:    amiodarone (PACERONE) 200 MG tablet, Take 1 tablet by mouth once daily, Disp: 90 tablet, Rfl: 2   apixaban (ELIQUIS) 5 MG TABS tablet, Take 1 tablet by mouth twice daily, Disp: 180 tablet, Rfl: 2   Ascorbic Acid (VITAMIN C PO), Take 1 tablet by mouth daily., Disp: , Rfl:    azelastine (ASTELIN) 0.1 % nasal spray, Place 2 sprays into both nostrils 2 (two) times daily., Disp: , Rfl:    CALCIUM-VITAMIN D PO, Take 2 tablets by mouth at bedtime., Disp: , Rfl:    cetirizine (ZYRTEC) 10 MG tablet, Take 10 mg by mouth daily., Disp: ,  Rfl:    Cholecalciferol (VITAMIN D3) 125 MCG (5000 UT) TABS, Take 5,000 Units by mouth daily., Disp: , Rfl:    Coenzyme Q10 (CO Q 10 PO), Take 200 mg by mouth daily., Disp: , Rfl:    diltiazem (TIAZAC) 300 MG 24 hr capsule, Take 1 capsule by mouth once daily, Disp: 90 capsule, Rfl: 2   diphenhydrAMINE (BENADRYL) 25 MG tablet, Take 25-50 mg by mouth as needed for allergies., Disp: , Rfl:    doxycycline (VIBRAMYCIN) 50 MG capsule, Take 50 mg by mouth daily., Disp: , Rfl:    DOXYCYCLINE PO, Take 50 mg by mouth daily., Disp: , Rfl:    ezetimibe (ZETIA) 10 MG tablet, Take 10 mg by mouth daily., Disp: , Rfl:    furosemide (LASIX) 40 MG tablet, Take 1 tablet by mouth once daily, Disp: 90 tablet, Rfl: 2    glucosamine-chondroitin 500-400 MG tablet, Take 1 tablet by mouth in the morning and at bedtime. Triple strength, Disp: , Rfl:    ipratropium (ATROVENT) 0.03 % nasal spray, 1-2 sprays in each nostril Nasally 4 times a day prn for 30 days, Disp: , Rfl:    pantoprazole (PROTONIX) 40 MG tablet, Take 40 mg by mouth daily., Disp: , Rfl:    Polyethyl Glycol-Propyl Glycol (SYSTANE OP), Place 2 drops into both eyes 2 (two) times daily., Disp: , Rfl:    potassium chloride SA (KLOR-CON M20) 20 MEQ tablet, Take 1 tablet by mouth once daily, Disp: 90 tablet, Rfl: 1   rosuvastatin (CRESTOR) 10 MG tablet, Take 10 mg by mouth daily., Disp: , Rfl:     Physical Exam: There were no vitals taken for this visit.    Affect appropriate Healthy:  appears stated age HEENT: normal Neck supple with no adenopathy JVP normal no bruits no thyromegaly Lungs clear with no wheezing and good diaphragmatic motion Heart:  S1/S2 no murmur, no rub, gallop or click PMI normal Abdomen: benighn, BS positve, no tenderness, no AAA no bruit.  No HSM or HJR Distal pulses intact with no bruits No edema Neuro non-focal Right hammer toe   Labs:   Lab Results  Component Value Date   WBC 4.2 12/10/2022   HGB 13.6 12/10/2022   HCT 42.4 12/10/2022   MCV 89 12/10/2022   PLT 318 12/10/2022   No results for input(s): "NA", "K", "CL", "CO2", "BUN", "CREATININE", "CALCIUM", "PROT", "BILITOT", "ALKPHOS", "ALT", "AST", "GLUCOSE" in the last 168 hours.  Invalid input(s): "LABALBU" Lab Results  Component Value Date   CKTOTAL 57 01/30/2022    Lab Results  Component Value Date   CHOL 203 (H) 02/14/2021   Lab Results  Component Value Date   HDL 67 02/14/2021   Lab Results  Component Value Date   LDLCALC 125 (H) 02/14/2021   Lab Results  Component Value Date   TRIG 55 02/14/2021   Lab Results  Component Value Date   CHOLHDL 3.0 02/14/2021   No results found for: "LDLDIRECT"    Radiology: DG Foot Complete  Right Result Date: 08/18/2023 Please see detailed radiograph report in office note.   EKG: See HPI  ***   ASSESSMENT AND PLAN:  Preoperative:  No history of CAD. Normal EF and maintaining NSR Functional status good can do 5 METS and ADL. Will update TTE given PAF and prior pericardial effusion on TTE 2023. Clear for surgery if EF normal.  PAF:  f/u Lalla Brothers. Continue amiodarone and cardizem. TSH 4.6 06/04/23 with normal AST/ALT. Normal lung exam  HTN:  continue lasix, K and cardizem *** HLD:  on statin labs with primary no history of CAD Anticoagulation:  hold for 2 days prior to foot surgery Meningioma:  stable by MRI 09/08/22   TTE  F/U Dr Anne Fu in a year  F/U Dr Lalla Brothers 6 months   Signed: Charlton Haws 09/09/2023, 7:10 PM

## 2023-09-09 NOTE — H&P (View-Only) (Signed)
 CARDIOLOGY CONSULT NOTE       Patient ID: Dorothy Powers MRN: [id number] DOB/AGE: 09/20/1945 78 y.o.  Admit date: (Not on file) Referring Physician: Sharl Ma Primary Physician: Joycelyn Rua, MD Primary Cardiologist: Derrek Monaco Reason for Consultation: Preoperative   Active Problems:   * No active hospital problems. *   HPI:  78 y.o. added on to DOD schedule for preoperative consult. Usually follows with EP for PAF. She has been in NSR and maintained on amiodarone, cardizem and eliquis. Seen by Dr Lalla Brothers 06/04/23 ? Brief episodes PAF no monitor ordered Patient to get [place] monitor. CHADVASC 6.  No history of chest pain, syncope, dyspnea. Needs right foot surgery for hammer toe. TTE done 01/31/22 EF 60-65% small pericardial effusion. No valve disease. History of HTN. And meningioma. Last MRI head 09/05/22 stable left frontal convexity 3 x 3.7 She was in NSR with RBBB on ECG 06/04/23 when seeing Dr Lalla Brothers.   ***  ROS All other systems reviewed and negative except as noted above  Past Medical History:  Diagnosis Date   Hypertension    Meningioma (HCC)    Tachycardia    TIA (transient ischemic attack)     Family History  Problem Relation Age of Onset   Heart disease Mother        pacemaker   Heart attack Father     Social History   Socioeconomic History   Marital status: Married    Spouse name: Not on file   Number of children: 0   Years of education: college   Highest education level: Bachelor's degree (e.g., BA, AB, BS)  Occupational History   Occupation: Retired  Tobacco Use   Smoking status: Former    Types: Cigarettes   Smokeless tobacco: Never  Substance and Sexual Activity   Alcohol use: Yes    Comment: social   Drug use: Never   Sexual activity: Not on file  Other Topics Concern   Not on file  Social History Narrative   Lives at home with husband.   Right-handed.   No daily use of caffeine.   Social Drivers of Research scientist (physical sciences) Strain: Not on file  Food Insecurity: Not on file  Transportation Needs: Not on file  Physical Activity: Not on file  Stress: Not on file  Social Connections: Not on file  Intimate Partner Violence: Not on file    Past Surgical History:  Procedure Laterality Date   ABDOMINAL HYSTERECTOMY     BUNIONECTOMY Right 11/2020   CARDIOVERSION N/A 02/01/2022   Procedure: CARDIOVERSION;  Surgeon: Chilton Si, MD;  Location: [facility] ENDOSCOPY;  Service: Cardiovascular;  Laterality: N/A;      Current Outpatient Medications:    albuterol (VENTOLIN HFA) 108 (90 Base) MCG/ACT inhaler, Inhale 1-2 puffs into the lungs every 6 (six) hours as needed for wheezing., Disp: , Rfl:    amiodarone (PACERONE) 200 MG tablet, Take 1 tablet by mouth once daily, Disp: 90 tablet, Rfl: 2   apixaban (ELIQUIS) 5 MG TABS tablet, Take 1 tablet by mouth twice daily, Disp: 180 tablet, Rfl: 2   Ascorbic Acid (VITAMIN C PO), Take 1 tablet by mouth daily., Disp: , Rfl:    azelastine (ASTELIN) 0.1 % nasal spray, Place 2 sprays into both nostrils 2 (two) times daily., Disp: , Rfl:    CALCIUM-VITAMIN D PO, Take 2 tablets by mouth at bedtime., Disp: , Rfl:    cetirizine (ZYRTEC) 10 MG tablet, Take 10 mg by mouth daily., Disp: ,  Rfl:    Cholecalciferol (VITAMIN D3) 125 MCG (5000 UT) TABS, Take 5,000 Units by mouth daily., Disp: , Rfl:    Coenzyme Q10 (CO Q 10 PO), Take 200 mg by mouth daily., Disp: , Rfl:    diltiazem (TIAZAC) 300 MG 24 hr capsule, Take 1 capsule by mouth once daily, Disp: 90 capsule, Rfl: 2   diphenhydrAMINE (BENADRYL) 25 MG tablet, Take 25-50 mg by mouth as needed for allergies., Disp: , Rfl:    doxycycline (VIBRAMYCIN) 50 MG capsule, Take 50 mg by mouth daily., Disp: , Rfl:    DOXYCYCLINE PO, Take 50 mg by mouth daily., Disp: , Rfl:    ezetimibe (ZETIA) 10 MG tablet, Take 10 mg by mouth daily., Disp: , Rfl:    furosemide (LASIX) 40 MG tablet, Take 1 tablet by mouth once daily, Disp: 90 tablet, Rfl: 2    glucosamine-chondroitin 500-400 MG tablet, Take 1 tablet by mouth in the morning and at bedtime. Triple strength, Disp: , Rfl:    ipratropium (ATROVENT) 0.03 % nasal spray, 1-2 sprays in each nostril Nasally 4 times a day prn for 30 days, Disp: , Rfl:    pantoprazole (PROTONIX) 40 MG tablet, Take 40 mg by mouth daily., Disp: , Rfl:    Polyethyl Glycol-Propyl Glycol (SYSTANE OP), Place 2 drops into both eyes 2 (two) times daily., Disp: , Rfl:    potassium chloride SA (KLOR-CON M20) 20 MEQ tablet, Take 1 tablet by mouth once daily, Disp: 90 tablet, Rfl: 1   rosuvastatin (CRESTOR) 10 MG tablet, Take 10 mg by mouth daily., Disp: , Rfl:     Physical Exam: There were no vitals taken for this visit.    Affect appropriate Healthy:  appears stated age HEENT: normal Neck supple with no adenopathy JVP normal no bruits no thyromegaly Lungs clear with no wheezing and good diaphragmatic motion Heart:  S1/S2 no murmur, no rub, gallop or click PMI normal Abdomen: benighn, BS positve, no tenderness, no AAA no bruit.  No HSM or HJR Distal pulses intact with no bruits No edema Neuro non-focal Right hammer toe   Labs:   Lab Results  Component Value Date   WBC 4.2 12/10/2022   HGB 13.6 12/10/2022   HCT 42.4 12/10/2022   MCV 89 12/10/2022   PLT 318 12/10/2022   No results for input(s): "NA", "K", "CL", "CO2", "BUN", "CREATININE", "CALCIUM", "PROT", "BILITOT", "ALKPHOS", "ALT", "AST", "GLUCOSE" in the last 168 hours.  Invalid input(s): "LABALBU" Lab Results  Component Value Date   CKTOTAL 57 01/30/2022    Lab Results  Component Value Date   CHOL 203 (H) 02/14/2021   Lab Results  Component Value Date   HDL 67 02/14/2021   Lab Results  Component Value Date   LDLCALC 125 (H) 02/14/2021   Lab Results  Component Value Date   TRIG 55 02/14/2021   Lab Results  Component Value Date   CHOLHDL 3.0 02/14/2021   No results found for: "LDLDIRECT"    Radiology: DG Foot Complete  Right Result Date: 08/18/2023 Please see detailed radiograph report in office note.   EKG: See HPI  ***   ASSESSMENT AND PLAN:  Preoperative:  No history of CAD. Normal EF and maintaining NSR Functional status good can do 5 METS and ADL. Will update TTE given PAF and prior pericardial effusion on TTE 2023. Clear for surgery if EF normal.  PAF:  f/u Lalla Brothers. Continue amiodarone and cardizem. TSH 4.6 06/04/23 with normal AST/ALT. Normal lung exam  HTN:  continue lasix, K and cardizem *** HLD:  on statin labs with primary no history of CAD Anticoagulation:  hold for 2 days prior to foot surgery Meningioma:  stable by MRI 09/08/22   TTE  F/U Dr Anne Fu in a year  F/U Dr Lalla Brothers 6 months   Signed: Charlton Haws 09/09/2023, 7:10 PM

## 2023-09-10 ENCOUNTER — Encounter: Payer: Self-pay | Admitting: Cardiovascular Disease

## 2023-09-10 ENCOUNTER — Ambulatory Visit: Payer: Medicare Other | Attending: Cardiovascular Disease | Admitting: Cardiovascular Disease

## 2023-09-10 VITALS — BP 140/64 | HR 72 | Ht 62.0 in | Wt 194.6 lb

## 2023-09-10 DIAGNOSIS — D6869 Other thrombophilia: Secondary | ICD-10-CM | POA: Diagnosis not present

## 2023-09-10 DIAGNOSIS — Z01810 Encounter for preprocedural cardiovascular examination: Secondary | ICD-10-CM | POA: Diagnosis not present

## 2023-09-10 DIAGNOSIS — I4510 Unspecified right bundle-branch block: Secondary | ICD-10-CM | POA: Diagnosis not present

## 2023-09-10 DIAGNOSIS — I480 Paroxysmal atrial fibrillation: Secondary | ICD-10-CM | POA: Diagnosis not present

## 2023-09-10 NOTE — Patient Instructions (Signed)
 Medication Instructions:  Your physician recommends that you continue on your current medications as directed. Please refer to the Current Medication list given to you today.  *If you need a refill on your cardiac medications before your next appointment, please call your pharmacy*  Lab Work: If you have labs (blood work) drawn today and your tests are completely normal, you will receive your results only by: MyChart Message (if you have MyChart) OR A paper copy in the mail If you have any lab test that is abnormal or we need to change your treatment, we will call you to review the results.  Testing/Procedures: Your physician has requested that you have an echocardiogram. Echocardiography is a painless test that uses sound waves to create images of your heart. It provides your doctor with information about the size and shape of your heart and how well your heart's chambers and valves are working. This procedure takes approximately one hour. There are no restrictions for this procedure. Please do NOT wear cologne, perfume, aftershave, or lotions (deodorant is allowed). Please arrive 15 minutes prior to your appointment time.  Please note: We ask at that you not bring children with you during ultrasound (echo/ vascular) testing. Due to room size and safety concerns, children are not allowed in the ultrasound rooms during exams. Our front office staff cannot provide observation of children in our lobby area while testing is being conducted. An adult accompanying a patient to their appointment will only be allowed in the ultrasound room at the discretion of the ultrasound technician under special circumstances. We apologize for any inconvenience. Follow-Up: At [facility], you and your health needs are our priority.  As part of our continuing mission to provide you with exceptional heart care, we have created designated Provider Care Teams.  These Care Teams include your primary Cardiologist  (physician) and Advanced Practice Providers (APPs -  Physician Assistants and Nurse Practitioners) who all work together to provide you with the care you need, when you need it.  We recommend signing up for the patient portal called "MyChart".  Sign up information is provided on this After Visit Summary.  MyChart is used to connect with patients for Virtual Visits (Telemedicine).  Patients are able to view lab/test results, encounter notes, upcoming appointments, etc.  Non-urgent messages can be sent to your provider as well.   To learn more about what you can do with MyChart, go to [email or web address].    Your next appointment:   1 year(s)  Provider:   Dr. Anne Fu     Other Instructions       1st Floor: - Lobby - Registration  - Pharmacy  - Lab - Cafe  2nd Floor: - PV Lab - Diagnostic Testing (echo, CT, nuclear med)  3rd Floor: - Vacant  4th Floor: - TCTS (cardiothoracic surgery) - AFib Clinic - Structural Heart Clinic - Vascular Surgery  - Vascular Ultrasound  5th Floor: - [facility] (general and EP) - Clinical Pharmacy for coumadin, hypertension, lipid, weight-loss medications, and med management appointments    Valet parking services will be available as well.

## 2023-09-12 ENCOUNTER — Telehealth: Payer: Self-pay | Admitting: Podiatry

## 2023-09-12 NOTE — Telephone Encounter (Signed)
 Patient is requesting to speak with Dr. Lilian Kapur only. Patient has questions regarding surgery. [phone]

## 2023-09-15 DIAGNOSIS — I10 Essential (primary) hypertension: Secondary | ICD-10-CM | POA: Diagnosis not present

## 2023-09-15 DIAGNOSIS — I5032 Chronic diastolic (congestive) heart failure: Secondary | ICD-10-CM | POA: Diagnosis not present

## 2023-09-15 DIAGNOSIS — I480 Paroxysmal atrial fibrillation: Secondary | ICD-10-CM | POA: Diagnosis not present

## 2023-09-15 DIAGNOSIS — M21961 Unspecified acquired deformity of right lower leg: Secondary | ICD-10-CM | POA: Diagnosis not present

## 2023-09-15 DIAGNOSIS — M19071 Primary osteoarthritis, right ankle and foot: Secondary | ICD-10-CM | POA: Diagnosis not present

## 2023-09-15 DIAGNOSIS — D6869 Other thrombophilia: Secondary | ICD-10-CM | POA: Diagnosis not present

## 2023-09-15 DIAGNOSIS — E7800 Pure hypercholesterolemia, unspecified: Secondary | ICD-10-CM | POA: Diagnosis not present

## 2023-09-15 DIAGNOSIS — M2041 Other hammer toe(s) (acquired), right foot: Secondary | ICD-10-CM | POA: Diagnosis not present

## 2023-09-15 DIAGNOSIS — Z01810 Encounter for preprocedural cardiovascular examination: Secondary | ICD-10-CM | POA: Diagnosis not present

## 2023-09-16 NOTE — Telephone Encounter (Signed)
 Spoke with patient on evening of 09/12/2023 and discussed and addressed all questions regarding surgery

## 2023-09-18 ENCOUNTER — Ambulatory Visit (INDEPENDENT_AMBULATORY_CARE_PROVIDER_SITE_OTHER): Payer: Medicare Other | Admitting: Podiatry

## 2023-09-18 DIAGNOSIS — D689 Coagulation defect, unspecified: Secondary | ICD-10-CM

## 2023-09-18 DIAGNOSIS — B351 Tinea unguium: Secondary | ICD-10-CM | POA: Diagnosis not present

## 2023-09-18 DIAGNOSIS — L84 Corns and callosities: Secondary | ICD-10-CM | POA: Diagnosis not present

## 2023-09-18 DIAGNOSIS — M79674 Pain in right toe(s): Secondary | ICD-10-CM

## 2023-09-18 DIAGNOSIS — M79675 Pain in left toe(s): Secondary | ICD-10-CM | POA: Diagnosis not present

## 2023-09-19 ENCOUNTER — Ambulatory Visit (HOSPITAL_COMMUNITY)
Admission: RE | Admit: 2023-09-19 | Discharge: 2023-09-19 | Disposition: A | Discharge status: Home / Self Care | Payer: Medicare Other | Source: Ambulatory Visit | Attending: Cardiovascular Disease | Admitting: Cardiovascular Disease

## 2023-09-19 DIAGNOSIS — I340 Nonrheumatic mitral (valve) insufficiency: Secondary | ICD-10-CM | POA: Diagnosis not present

## 2023-09-19 DIAGNOSIS — I10 Essential (primary) hypertension: Secondary | ICD-10-CM | POA: Diagnosis not present

## 2023-09-19 DIAGNOSIS — Z01810 Encounter for preprocedural cardiovascular examination: Secondary | ICD-10-CM | POA: Diagnosis not present

## 2023-09-19 LAB — ECHOCARDIOGRAM COMPLETE
AR max vel: 2.15 cm2
AV Area VTI: 1.82 cm2
AV Area mean vel: 1.88 cm2
AV Mean grad: 5 mmHg
AV Peak grad: 9.7 mmHg
Ao pk vel: 1.56 m/s
Area-P 1/2: 3.46 cm2
MV VTI: 1.74 cm2
S' Lateral: 2.8 cm

## 2023-09-19 NOTE — Progress Notes (Signed)
*  PRELIMINARY RESULTS* Echocardiogram 2D Echocardiogram has been performed.  Dorothy Powers [name] 09/19/2023, 4:00 PM

## 2023-09-22 ENCOUNTER — Telehealth: Payer: Self-pay | Admitting: Cardiovascular Disease

## 2023-09-22 NOTE — Telephone Encounter (Signed)
 Wendall Stade, MD to Me     09/19/23  4:31 PM Result Note EF normal just mild MR overall good  The patient has been notified of the result and verbalized understanding.  All questions (if any) were answered. Ethelda Chick, RN 09/22/2023 12:06 PM

## 2023-09-22 NOTE — Telephone Encounter (Signed)
 Patient is returning phone call in regard to Echo results. Please advise.

## 2023-09-23 ENCOUNTER — Encounter: Payer: Self-pay | Admitting: Podiatry

## 2023-09-23 NOTE — Progress Notes (Signed)
  Subjective:  Patient ID: Dorothy Powers, female    DOB: July 27, 1945,  MRN: [id number]  Dorothy Powers presents to clinic today for: painful porokeratotic lesion(s) of both feet and painful mycotic toenails that limit ambulation. Painful toenails interfere with ambulation. Aggravating factors include wearing enclosed shoe gear. Pain is relieved with periodic professional debridement. Painful porokeratotic lesions are aggravated when weightbearing with and without shoegear. Pain is relieved with periodic professional debridement.   She is scheduled to have surgery with Dr. Lilian Kapur for painful porokeratotic lesion(s).   PCP is Dorothy Rua, MD.  Allergies  Allergen Reactions   Codeine Other (See Comments)    "Couldn't move one night for a short time, after taking the medicaiton"   Lovastatin Other (See Comments)    Leg pain   Sulfa Antibiotics Other (See Comments)    "Felt weak"    Review of Systems: Negative except as noted in the HPI.  Objective: No changes noted in today's physical examination. There were no vitals filed for this visit.  Dorothy Powers is a pleasant 78 y.o. female WD, WN in NAD. AAO x 3.  Vascular Examination: Capillary refill time <3 seconds b/l LE. Palpable pedal pulses b/l LE. Digital hair present b/l. No pedal edema b/l. Skin temperature gradient WNL b/l. No varicosities b/l. No cyanosis or clubbing noted b/l LE.Marland Kitchen  Dermatological Examination: Pedal skin with normal turgor, texture and tone b/l. No open wounds. No interdigital macerations b/l. Toenails 1-5 b/l thickened, discolored, dystrophic with subungual debris. There is pain on palpation to dorsal aspect of nailplates.   Porokeratotic lesion(s) R 3rd toe, submet head 2 right foot, and submet head 5 b/l. No erythema, no edema, no drainage, no fluctuance..  Neurological Examination: Protective sensation intact with 10 gram monofilament b/l LE. Vibratory sensation intact b/l LE.    Musculoskeletal Examination: Normal muscle strength 5/5 to all lower extremity muscle groups bilaterally. Hammertoe deformity noted 2-5 b/l.Marland Kitchen No pain, crepitus or joint limitation noted with ROM b/l LE.  Patient ambulates independently without assistive aids.  Assessment/Plan: 1. Pain due to onychomycosis of toenails of both feet   2. Corns   3. Coagulation defect (HCC)     -Consent given for treatment as described below: -Examined patient. -Toenails 1-5 b/l were debrided in length and girth with sterile nail nippers and dremel without iatrogenic bleeding.  -Corn(s) R 3rd toe pared utilizing sterile scalpel blade without complication or incident. Total number debrided=1. -Patient to follow up with me after cleared by Dr. Lilian Kapur. -Patient/POA to call should there be question/concern in the interim.   No follow-ups on file.  Freddie Breech, DPM      [place] LOCATION: [address] [place] [address]                   Office [phone]   [facility] LOCATION: [address] [place], [place] [address] Office [phone]

## 2023-09-25 ENCOUNTER — Other Ambulatory Visit: Payer: Self-pay

## 2023-09-25 ENCOUNTER — Encounter
Admission: RE | Admit: 2023-09-25 | Discharge: 2023-09-25 | Disposition: A | Discharge status: Home / Self Care | Source: Ambulatory Visit | Attending: Podiatry | Admitting: Podiatry

## 2023-09-25 VITALS — Ht 62.0 in | Wt 194.7 lb

## 2023-09-25 DIAGNOSIS — I5030 Unspecified diastolic (congestive) heart failure: Secondary | ICD-10-CM

## 2023-09-25 DIAGNOSIS — I4891 Unspecified atrial fibrillation: Secondary | ICD-10-CM

## 2023-09-25 DIAGNOSIS — Z79899 Other long term (current) drug therapy: Secondary | ICD-10-CM

## 2023-09-25 DIAGNOSIS — Z01812 Encounter for preprocedural laboratory examination: Secondary | ICD-10-CM

## 2023-09-25 DIAGNOSIS — I10 Essential (primary) hypertension: Secondary | ICD-10-CM

## 2023-09-25 DIAGNOSIS — G459 Transient cerebral ischemic attack, unspecified: Secondary | ICD-10-CM

## 2023-09-25 HISTORY — DX: Gastro-esophageal reflux disease without esophagitis: K21.9

## 2023-09-25 HISTORY — DX: Unspecified osteoarthritis, unspecified site: M19.90

## 2023-09-25 HISTORY — DX: Long term (current) use of anticoagulants: Z79.01

## 2023-09-25 HISTORY — DX: Heart failure, unspecified: I50.9

## 2023-09-25 HISTORY — DX: Hyperlipidemia, unspecified: E78.5

## 2023-09-25 HISTORY — DX: Rosacea, unspecified: L71.9

## 2023-09-25 HISTORY — DX: Unspecified right bundle-branch block: I45.10

## 2023-09-25 HISTORY — DX: Cardiac murmur, unspecified: R01.1

## 2023-09-25 HISTORY — DX: Hypokalemia: E87.6

## 2023-09-25 HISTORY — DX: Pneumonia, unspecified organism: J18.9

## 2023-09-25 HISTORY — DX: Unspecified atrial fibrillation: I48.91

## 2023-09-25 HISTORY — DX: Personal history of colon polyps, unspecified: Z86.0100

## 2023-09-25 HISTORY — DX: Morbid (severe) obesity due to excess calories: E66.01

## 2023-09-25 NOTE — Patient Instructions (Addendum)
 Your procedure is scheduled on:10-03-23 Friday Report to the Registration Desk on the 1st floor of the [facility].Then proceed to the 2nd floor Surgery Desk To find out your arrival time, please call [phone] between 1PM - 3PM on:10-02-23 Thursday If your arrival time is 6:00 am, do not arrive before that time as the [facility] entrance doors do not open until 6:00 am.  REMEMBER: Instructions that are not followed completely may result in serious medical risk, up to and including death; or upon the discretion of your surgeon and anesthesiologist your surgery may need to be rescheduled.  Do not eat food OR drink any liquids after midnight the night before surgery (except for the Ensure that Dr Lilian Kapur has ordered for you to drink) No gum chewing or hard candies.  In addition, your doctor has ordered for you to drink the provided:  Ensure Pre-Surgery Clear Carbohydrate Drink  Drinking this carbohydrate drink up to two hours before surgery helps to reduce insulin resistance and improve patient outcomes. Please complete drinking 2 hours before scheduled arrival time.  One week prior to surgery:Stop NOW (09-25-23) Stop Anti-inflammatories (NSAIDS) such as Advil, Aleve, Ibuprofen, Motrin, Naproxen, Naprosyn and Aspirin based products such as Excedrin, Goody's Powder, BC Powder. Stop ANY OVER THE COUNTER supplements until after surgery (Vitamin C, Calcium-Vitamin D, Vitamin D3, Co Q-10, Glucosamine-Chondroitin)  You may however, continue to take Tylenol if needed for pain up until the day of surgery.  Stop apixaban (ELIQUIS) 2 days prior to surgery-Last dose will be on 09-30-23 Tuesday  Continue taking all of your other prescription medications up until the day of surgery.  ON THE DAY OF SURGERY ONLY TAKE THESE MEDICATIONS WITH SIPS OF WATER: -amiodarone (PACERONE)  -diltiazem (TIAZAC)  -ezetimibe (ZETIA)  -pantoprazole (PROTONIX)  -potassium chloride SA (KLOR-CON M20)  -rosuvastatin  (CRESTOR)   Bring your Albuterol Inhaler to the hospital  No Alcohol for 24 hours before or after surgery.  No Smoking including e-cigarettes for 24 hours before surgery.  No chewable tobacco products for at least 6 hours before surgery.  No nicotine patches on the day of surgery.  Do not use any "recreational" drugs for at least a week (preferably 2 weeks) before your surgery.  Please be advised that the combination of cocaine and anesthesia may have negative outcomes, up to and including death. If you test positive for cocaine, your surgery will be cancelled.  On the morning of surgery brush your teeth with toothpaste and water, you may rinse your mouth with mouthwash if you wish. Do not swallow any toothpaste or mouthwash.  Use CHG Soap as directed on instruction sheet.  Do not wear jewelry, make-up, hairpins, clips or nail polish.  For welded (permanent) jewelry: bracelets, anklets, waist bands, etc.  Please have this removed prior to surgery.  If it is not removed, there is a chance that hospital personnel will need to cut it off on the day of surgery.  Do not wear lotions, powders, or perfumes.   Do not shave body hair from the neck down 48 hours before surgery.  Contact lenses, hearing aids and dentures may not be worn into surgery.  Do not bring valuables to the hospital. [facility] is not responsible for any missing/lost belongings or valuables.   Notify your doctor if there is any change in your medical condition (cold, fever, infection).  Wear comfortable clothing (specific to your surgery type) to the hospital.  After surgery, you can help prevent lung complications by  doing breathing exercises.  Take deep breaths and cough every 1-2 hours. Your doctor may order a device called an Incentive Spirometer to help you take deep breaths. When coughing or sneezing, hold a pillow firmly against your incision with both hands. This is called "splinting." Doing this helps  protect your incision. It also decreases belly discomfort.  If you are being admitted to the hospital overnight, leave your suitcase in the car. After surgery it may be brought to your room.  In case of increased patient census, it may be necessary for you, the patient, to continue your postoperative care in the Same Day Surgery department.  If you are being discharged the day of surgery, you will not be allowed to drive home. You will need a responsible individual to drive you home and stay with you for 24 hours after surgery.   If you are taking public transportation, you will need to have a responsible individual with you.  Please call the Pre-admissions Testing Dept. at [phone] if you have any questions about these instructions.  Surgery Visitation Policy:  Patients having surgery or a procedure may have two visitors.  Children under the age of 41 must have an adult with them who is not the patient.  Temporary Visitor Restrictions Due to increasing cases of flu, RSV and COVID-19: Children ages 72 and under will not be able to visit patients in [facility] hospitals under most circumstances.     Preparing for Surgery with CHLORHEXIDINE GLUCONATE (CHG) Soap  Chlorhexidine Gluconate (CHG) Soap  o An antiseptic cleaner that kills germs and bonds with the skin to continue killing germs even after washing  o Used for showering the night before surgery and morning of surgery  Before surgery, you can play an important role by reducing the number of germs on your skin.  CHG (Chlorhexidine gluconate) soap is an antiseptic cleanser which kills germs and bonds with the skin to continue killing germs even after washing.  Please do not use if you have an allergy to CHG or antibacterial soaps. If your skin becomes reddened/irritated stop using the CHG.  1. Shower the NIGHT BEFORE SURGERY and the MORNING OF SURGERY with CHG soap.  2. If you choose to wash your hair, wash your hair  first as usual with your normal shampoo.  3. After shampooing, rinse your hair and body thoroughly to remove the shampoo.  4. Use CHG as you would any other liquid soap. You can apply CHG directly to the skin and wash gently with a scrungie or a clean washcloth.  5. Apply the CHG soap to your body only from the neck down. Do not use on open wounds or open sores. Avoid contact with your eyes, ears, mouth, and genitals (private parts). Wash face and genitals (private parts) with your normal soap.  6. Wash thoroughly, paying special attention to the area where your surgery will be performed.  7. Thoroughly rinse your body with warm water.  8. Do not shower/wash with your normal soap after using and rinsing off the CHG soap.  9. Pat yourself dry with a clean towel.  10. Wear clean pajamas to bed the night before surgery.  12. Place clean sheets on your bed the night of your first shower and do not sleep with pets.  13. Shower again with the CHG soap on the day of surgery prior to arriving at the hospital.  14. Do not apply any deodorants/lotions/powders.  15. Please wear clean clothes to  the hospital.  How to Use an Incentive Spirometer An incentive spirometer is a tool that measures how well you are filling your lungs with each breath. Learning to take long, deep breaths using this tool can help you keep your lungs clear and active. This may help to reverse or lessen your chance of developing breathing (pulmonary) problems, especially infection. You may be asked to use a spirometer: After a surgery. If you have a lung problem or a history of smoking. After a long period of time when you have been unable to move or be active. If the spirometer includes an indicator to show the highest number that you have reached, your health care provider or respiratory therapist will help you set a goal. Keep a log of your progress as told by your health care provider. What are the risks? Breathing too  quickly may cause dizziness or cause you to pass out. Take your time so you do not get dizzy or light-headed. If you are in pain, you may need to take pain medicine before doing incentive spirometry. It is harder to take a deep breath if you are having pain. How to use your incentive spirometer  Sit up on the edge of your bed or on a chair. Hold the incentive spirometer so that it is in an upright position. Before you use the spirometer, breathe out normally. Place the mouthpiece in your mouth. Make sure your lips are closed tightly around it. Breathe in slowly and as deeply as you can through your mouth, causing the piston or the ball to rise toward the top of the chamber. Hold your breath for 3-5 seconds, or for as long as possible. If the spirometer includes a coach indicator, use this to guide you in breathing. Slow down your breathing if the indicator goes above the marked areas. Remove the mouthpiece from your mouth and breathe out normally. The piston or ball will return to the bottom of the chamber. Rest for a few seconds, then repeat the steps 10 or more times. Take your time and take a few normal breaths between deep breaths so that you do not get dizzy or light-headed. Do this every 1-2 hours when you are awake. If the spirometer includes a goal marker to show the highest number you have reached (best effort), use this as a goal to work toward during each repetition. After each set of 10 deep breaths, cough a few times. This will help to make sure that your lungs are clear. If you have an incision on your chest or abdomen from surgery, place a pillow or a rolled-up towel firmly against the incision when you cough. This can help to reduce pain while taking deep breaths and coughing. General tips When you are able to get out of bed: Walk around often. Continue to take deep breaths and cough in order to clear your lungs. Keep using the incentive spirometer until your health care  provider says it is okay to stop using it. If you have been in the hospital, you may be told to keep using the spirometer at home. Contact a health care provider if: You are having difficulty using the spirometer. You have trouble using the spirometer as often as instructed. Your pain medicine is not giving enough relief for you to use the spirometer as told. You have a fever. Get help right away if: You develop shortness of breath. You develop a cough with bloody mucus from the lungs. You have fluid or blood  coming from an incision site after you cough. Summary An incentive spirometer is a tool that can help you learn to take long, deep breaths to keep your lungs clear and active. You may be asked to use a spirometer after a surgery, if you have a lung problem or a history of smoking, or if you have been inactive for a long period of time. Use your incentive spirometer as instructed every 1-2 hours while you are awake. If you have an incision on your chest or abdomen, place a pillow or a rolled-up towel firmly against your incision when you cough. This will help to reduce pain. Get help right away if you have shortness of breath, you cough up bloody mucus, or blood comes from your incision when you cough. This information is not intended to replace advice given to you by your health care provider. Make sure you discuss any questions you have with your health care provider. Document Revised: 05/09/2023 Document Reviewed: 05/09/2023 Elsevier Patient Education  2024 [employer].

## 2023-09-26 ENCOUNTER — Encounter
Admission: RE | Admit: 2023-09-26 | Discharge: 2023-09-26 | Disposition: A | Discharge status: Home / Self Care | Source: Ambulatory Visit | Attending: Podiatry | Admitting: Podiatry

## 2023-09-26 DIAGNOSIS — G459 Transient cerebral ischemic attack, unspecified: Secondary | ICD-10-CM | POA: Diagnosis not present

## 2023-09-26 DIAGNOSIS — I4891 Unspecified atrial fibrillation: Secondary | ICD-10-CM | POA: Insufficient documentation

## 2023-09-26 DIAGNOSIS — I10 Essential (primary) hypertension: Secondary | ICD-10-CM

## 2023-09-26 DIAGNOSIS — Z01812 Encounter for preprocedural laboratory examination: Secondary | ICD-10-CM | POA: Diagnosis not present

## 2023-09-26 DIAGNOSIS — Z79899 Other long term (current) drug therapy: Secondary | ICD-10-CM | POA: Diagnosis not present

## 2023-09-26 DIAGNOSIS — I5030 Unspecified diastolic (congestive) heart failure: Secondary | ICD-10-CM | POA: Insufficient documentation

## 2023-09-26 DIAGNOSIS — I110 Hypertensive heart disease with heart failure: Secondary | ICD-10-CM | POA: Insufficient documentation

## 2023-09-26 DIAGNOSIS — Z01818 Encounter for other preprocedural examination: Secondary | ICD-10-CM | POA: Diagnosis present

## 2023-09-26 LAB — CBC
HCT: 42.6 % (ref 36.0–46.0)
Hemoglobin: 13.9 g/dL (ref 12.0–15.0)
MCH: 29.3 pg (ref 26.0–34.0)
MCHC: 32.6 g/dL (ref 30.0–36.0)
MCV: 89.9 fL (ref 80.0–100.0)
Platelets: 312 10*3/uL (ref 150–400)
RBC: 4.74 MIL/uL (ref 3.87–5.11)
RDW: 13.3 % (ref 11.5–15.5)
WBC: 4.8 10*3/uL (ref 4.0–10.5)
nRBC: 0 % (ref 0.0–0.2)

## 2023-09-26 LAB — BASIC METABOLIC PANEL
Anion gap: 13 (ref 5–15)
BUN: 23 mg/dL (ref 8–23)
CO2: 28 mmol/L (ref 22–32)
Calcium: 9.4 mg/dL (ref 8.9–10.3)
Chloride: 99 mmol/L (ref 98–111)
Creatinine, Ser: 0.82 mg/dL (ref 0.44–1.00)
GFR, Estimated: >60 mL/min (ref >60)
Glucose, Bld: 102 mg/dL — ABNORMAL HIGH (ref 70–99)
Potassium: 3.4 mmol/L — ABNORMAL LOW (ref 3.5–5.1)
Sodium: 140 mmol/L (ref 135–145)

## 2023-09-29 ENCOUNTER — Telehealth: Payer: Self-pay | Admitting: Cardiology

## 2023-09-29 NOTE — Telephone Encounter (Signed)
 I will forward to preop APP to review if the pt has been cleared.

## 2023-09-29 NOTE — Telephone Encounter (Signed)
   Patient Name: Dorothy Powers  DOB: 16-May-1946 MRN: [id number]  Primary Cardiologist: Donato Schultz, MD  Chart reviewed as part of pre-operative protocol coverage. Given past medical history and time since last visit, based on ACC/AHA guidelines, Dorothy Powers is at acceptable risk for the planned procedure without further cardiovascular testing.  Patient was seen in clinic on 09/10/2023 by Dr. Eden Emms.  She has no history of CAD and had been maintaining normal sinus rhythm.  Echocardiogram on 09/19/2023 indicated normal LVEF, patient at acceptable risk for procedure.  Per office protocol, patient can hold Eliquis for 2 days prior to procedure.  Please resume as soon as possible, when safe to do so from a bleeding standpoint.   I will route this recommendation to the requesting party via Epic fax function and remove from pre-op pool.  Please call with questions.  Rip Harbour, NP 09/29/2023, 9:34 AM

## 2023-09-29 NOTE — Telephone Encounter (Signed)
 Follow Up:        Dorothy Powers is calling to check on the status of patient's clearance. She said patient is scheduled for surgery on this Friday(10-03-23)

## 2023-09-30 ENCOUNTER — Encounter: Payer: Self-pay | Admitting: Podiatry

## 2023-09-30 NOTE — Progress Notes (Signed)
 Perioperative / Anesthesia Services  Pre-Admission Testing Clinical Review / Pre-Operative Anesthesia Consult  Date: 09/30/23  Patient Demographics:  Name: Dorothy Powers DOB: [birth year] MRN:   [id number]  Planned Surgical Procedure(s):    Case: [id number] Date/Time: 10/03/23 0715   Procedures:      METATARSAL HEAD EXCISION 2-5 (Right: Toe) - pre op block     HAMMER TOE CORRECTION 2-5 (Right: Toe)     FUSION, JOINT, GREAT TOE (Right)     BONE GRAFT (Right)   Anesthesia type: Choice   Pre-op diagnosis:      Hammertoe     Metatarsal deformity     Osteoarthritis of first metatarsophalangeal joint      Metatarsalgia   Location: [facility] OR ROOM 02 / [facility]   Surgeons: Edwin Cap, DPM      NOTE: Available PAT nursing documentation and vital signs have been reviewed. Clinical nursing staff has updated patient's PMH/PSHx, current medication list, and drug allergies/intolerances to ensure comprehensive history available to assist in medical decision making as it pertains to the aforementioned surgical procedure and anticipated anesthetic course. Extensive review of available clinical information personally performed. [place] PMH and PSHx updated with any diagnoses/procedures that  may have been inadvertently omitted during his intake with the pre-admission testing department's nursing staff.  Clinical Discussion:  Dorothy Powers is a 78 y.o. female who is submitted for pre-surgical anesthesia review and clearance prior to her undergoing the above procedure. Patient is a Former Games developer. Pertinent PMH includes: PAF, HFpEF, TIA, aortic atherosclerosis, tachycardia, meningioma, RBBB, HTN, HLD, GERD (on daily PPI), OA, hammertoes of the RIGHT foot.  Patient is followed by cardiology Eden Emms, MD). She was last seen in the cardiology clinic on 09/04/2023; notes reviewed. At the time of her clinic visit, patient doing well overall from a cardiovascular perspective.  Patient denied any chest pain, shortness of breath, PND, orthopnea, palpitations, significant peripheral edema, weakness, fatigue, vertiginous symptoms, or presyncope/syncope. Patient with a past medical history significant for cardiovascular diagnoses. Documented physical exam was grossly benign, providing no evidence of acute exacerbation and/or decompensation of the patient's known cardiovascular conditions.  Patient with an atrial fibrillation diagnosis; CHA2DS2-VASc Score = 6 (age x 2, sex, HFpEF, HTN, vascular disease).patient underwent DCCV procedure on 02/01/2022, at which time she received a single 150 J cardioversion restoring her rhythm to NSR with PACs.  Her rate and rhythm are currently being maintained on oral amiodarone + diltiazem. She is chronically anticoagulated using apixaban; reported to be compliant with therapy with no evidence or reports of GI/GU bleeding.  Blood pressure reasonably controlled at 140/64 mmHg on currently prescribed CCB (diltiazem) and diuretic (furosemide) therapies. She is on rosuvastatin for her HLD diagnosis and further ASCVD prevention.  Patient is not diabetic. Patient does not have an OSAH diagnosis.  Patient reports that she endeavors to maintain an active lifestyle, however due to podiatric pain, her abilities have been limited.  Patient is able to complete all ADLs/IADLs without cardiovascular limitation.  Per the DAS[facility], patient is able to exceed 4 METS of physical activity without experiencing any significant degree of angina/anginal equivalent symptoms.  No changes were made to her medication regimen.  Given patient's history of PAF and prior pericardial effusion, the decision was made to repeat TTE prior to providing clearance for upcoming podiatric surgery.  Patient to follow-up with outpatient cardiology in 1 year or sooner if needed.  Since patient was last seen by cardiology, she has undergone  the recommended noninvasive cardiovascular testing.  Most  recent TTE performed on 09/19/2023 revealed a normal left ventricular systolic function with an EF of 60-65%. There were no regional wall motion abnormalities. Left ventricular diastolic Doppler parameters consistent with pseudonormalization (G2DD). Left atrium was moderately and right atrium was mildly dilated right ventricular size and function normal with a TAPSE measuring 3.0 cm  (normal range >/= 1.6 cm). There was trivial to mild tricuspid and pulmonary valve regurgitation. Aortic valve mildly calcified. All transvalvular gradients were noted to be normal providing no evidence suggestive of valvular stenosis. Aorta normal in size with no evidence of ectasia or aneurysmal dilatation.  Dorothy Powers is scheduled for an elective METATARSAL HEAD EXCISION 2-5 (Right: Toe); HAMMER TOE CORRECTION 2-5 (Right: Toe); FUSION, JOINT, GREAT TOE (Right); BONE GRAFT (Right) on 10/03/2023 with Dr. Sharl Ma, DPM.  Given patient's past medical history significant for cardiovascular diagnoses, presurgical cardiac clearance was sought by the PAT team. Per cardiology, "based ACC/AHA guidelines, the patient's past medical history, and the amount of time since her last clinic visit, this patient would be at an overall ACCEPTABLE risk for the planned procedure without further cardiovascular testing or intervention at this time".   Again, this patient is on daily oral anticoagulation therapy using a DOAC medication.  She has been instructed on recommendations for holding her apixaban for 2 days prior to her procedure with plans to restart as soon as postoperative bleeding risk felt to be minimized by his attending surgeon. The patient has been instructed that her last dose of apixaban should be on 09/30/2023.  Patient denies previous perioperative complications with anesthesia in the past. In review her EMR, it is noted that patient underwent a general anesthetic course at [facility] ([facility] in 01/2022  without documented complications.      09/25/2023   11:00 AM 09/10/2023    3:02 PM 08/18/2023   11:13 AM  Vitals with BMI  Height 5\' 2"  5\' 2"  5\' 2"   Weight 194 lbs 11 oz 194 lbs 10 oz 193 lbs 10 oz  BMI 35.6 35.58 35.4  Systolic  140   Diastolic  64   Pulse  72    Providers/Specialists:  NOTE: Primary physician provider listed below. Patient may have been seen by APP or partner within same practice.   PROVIDER ROLE / SPECIALTY LAST OV  Edwin Cap, DPM Podiatry (Surgeon) 09/18/2023  Joycelyn Rua, MD Primary Care Provider 04/23/2023  Charlton Haws, MD Cardiology 09/10/2023; preop APP call 09/29/2023  Steffanie Dunn, MD Electrophysiology 06/04/2023   Allergies:   Allergies  Allergen Reactions   Codeine Other (See Comments)    "Couldn't move one night for a short time, after taking the medicaiton"   Lovastatin Other (See Comments)    Leg pain   Sulfa Antibiotics Other (See Comments)    "Felt weak"   Current Home Medications:   No current facility-administered medications for this encounter.    albuterol (VENTOLIN HFA) 108 (90 Base) MCG/ACT inhaler   amiodarone (PACERONE) 200 MG tablet   apixaban (ELIQUIS) 5 MG TABS tablet   cetirizine (ZYRTEC) 10 MG tablet   diltiazem (TIAZAC) 300 MG 24 hr capsule   doxycycline (VIBRAMYCIN) 50 MG capsule   ezetimibe (ZETIA) 10 MG tablet   furosemide (LASIX) 40 MG tablet   ipratropium (ATROVENT) 0.03 % nasal spray   pantoprazole (PROTONIX) 40 MG tablet   Polyethyl Glycol-Propyl Glycol (SYSTANE OP)   potassium chloride SA (KLOR-CON M20) 20 MEQ  tablet   rosuvastatin (CRESTOR) 10 MG tablet   Ascorbic Acid (VITAMIN C PO)   Calcium Carbonate-Vitamin D (CALCIUM-VITAMIN D) 600-3.125 MG-MCG TABS   Cholecalciferol (VITAMIN D3) 125 MCG (5000 UT) TABS   Coenzyme Q10 (CO Q 10 PO)   diphenhydrAMINE (BENADRYL) 25 MG tablet   Misc Natural Products (OSTEO BI-FLEX ADV TRIPLE ST PO)   History:   Past Medical History:  Diagnosis Date    (HFpEF) heart failure with preserved ejection fraction (HCC)    Aortic atherosclerosis (HCC)    CAP (community acquired pneumonia)    GERD (gastroesophageal reflux disease)    Hammertoe of right foot    Heart murmur    History of colonic polyps    Hyperlipidemia    Hypertension    Hypokalemia    Long term current use of amiodarone    Meningioma (HCC)    Morbid obesity (HCC)    OA (osteoarthritis)    On apixaban therapy    Osteoarthritis    PAF (paroxysmal atrial fibrillation) (HCC)    a.) CHA2DS2VASc = 6 (age x 2, sex, HFpEF, HTN, vascular disease) as of 09/30/2023; b.) s/p DCCV  02/01/2022 --> 150 J x 1 --> NSR with PACs; c. rate/rhythm maintained on oral amiodarone + diltiazem; chronically anticoagulated with apixaban   RBBB (right bundle branch block)    Rosacea    Tachycardia    TIA (transient ischemic attack) 2021   Past Surgical History:  Procedure Laterality Date   ABDOMINAL HYSTERECTOMY     BLADDER SURGERY     BUNIONECTOMY Right 11/2020   BUNIONECTOMY Right    corrective surgery from 1st bunion surgery   CARDIOVERSION N/A 02/01/2022   Procedure: CARDIOVERSION;  Surgeon: Chilton Si, MD;  Location: [facility] ENDOSCOPY;  Service: Cardiovascular;  Laterality: N/A;   COLONOSCOPY     Family History  Problem Relation Age of Onset   Heart disease Mother        pacemaker   Heart attack Father    Social History   Tobacco Use   Smoking status: Former    Types: Cigarettes   Smokeless tobacco: Never  Substance Use Topics   Alcohol use: Yes    Comment: very rare wine   Pertinent Clinical Results:  LABS:  Hospital Outpatient Visit on 09/26/2023  Component Date Value Ref Range Status   WBC 09/26/2023 4.8  4.0 - 10.5 K/uL Final   RBC 09/26/2023 4.74  3.87 - 5.11 MIL/uL Final   Hemoglobin 09/26/2023 13.9  12.0 - 15.0 g/dL Final   HCT 29/56/2130 42.6  36.0 - 46.0 % Final   MCV 09/26/2023 89.9  80.0 - 100.0 fL Final   MCH 09/26/2023 29.3  26.0 - 34.0 pg Final   MCHC  09/26/2023 32.6  30.0 - 36.0 g/dL Final   RDW 86/57/8469 13.3  11.5 - 15.5 % Final   Platelets 09/26/2023 312  150 - 400 K/uL Final   nRBC 09/26/2023 0.0  0.0 - 0.2 % Final   Performed at [facility], [address] [place], [place] [address]   Sodium 09/26/2023 140  135 - 145 mmol/L Final   Potassium 09/26/2023 3.4 (L)  3.5 - 5.1 mmol/L Final   Chloride 09/26/2023 99  98 - 111 mmol/L Final   CO2 09/26/2023 28  22 - 32 mmol/L Final   Glucose, Bld 09/26/2023 102 (H)  70 - 99 mg/dL Final   Glucose reference range applies only to samples taken after fasting for at least 8 hours.  BUN 09/26/2023 23  8 - 23 mg/dL Final   Creatinine, Ser 09/26/2023 0.82  0.44 - 1.00 mg/dL Final   Calcium 54/03/8118 9.4  8.9 - 10.3 mg/dL Final   GFR, Estimated 09/26/2023 >60  >60 mL/min Final   Comment: (NOTE) Calculated using the CKD-EPI Creatinine Equation (2021)    Anion gap 09/26/2023 13  5 - 15 Final   Performed at [facility], [address] [place], [place] [address]  Hospital Outpatient Visit on 09/19/2023  Component Date Value Ref Range Status   S' Lateral 09/19/2023 2.80  cm Final   AV Area VTI 09/19/2023 1.82  cm2 Final   AV Mean grad 09/19/2023 5.0  mmHg Final   AV Area mean vel 09/19/2023 1.88  cm2 Final   Area-P 1/2 09/19/2023 3.46  cm2 Final   AR max vel 09/19/2023 2.15  cm2 Final   AV Peak grad 09/19/2023 9.7  mmHg Final   Ao pk vel 09/19/2023 1.56  m/s Final   MV VTI 09/19/2023 1.74  cm2 Final   Est EF 09/19/2023 60 - 65%   Final    ECG: Date: 09/10/2023  Time ECG obtained: 1505 PM Rate: 70 bpm Rhythm:  Sinus rhythm with first-degree AV block; RBBB Axis (leads I and aVF): normal Intervals: PR 226 ms. QRS 152 ms. QTc 522 ms. ST segment and T wave changes: No evidence of acute T wave abnormalities or significant ST segment elevation or depression.  Evidence of a possible, age undetermined, prior infarct:  No Comparison: Similar to previous tracing obtained  on 06/04/2023   IMAGING / PROCEDURES: TRANSTHORACIC ECHOCARDIOGRAM performed on 09/19/2023 Left ventricular ejection fraction, by estimation, is 60 to 65%. The left ventricle has normal function. The left ventricle has no regional wall motion abnormalities. Left ventricular diastolic parameters are consistent with Grade II diastolic dysfunction (pseudonormalization).  Right ventricular systolic function is normal. The right ventricular size is normal.  Left atrial size was mild to moderately dilated.  Right atrial size was mildly dilated.  The mitral valve is normal in structure. Mild mitral valve regurgitation. No evidence of mitral stenosis.  The aortic valve is tricuspid. There is mild calcification of the aortic valve. Aortic valve regurgitation is not visualized. No aortic stenosis is present.  The inferior vena cava is normal in size with greater than 50% respiratory variability, suggesting right atrial pressure of 3 mmHg.   DG FOOT COMPLETE RIGHT performed on 08/18/2023 End-stage arthrosis of the first MTP joint Elongated second ray Tailor's bunion deformity  No evidence of infection or osteomyelitis  Digital contractures noted   MR BRAIN W WO CONTRAST performed on 09/05/2022 When accounting for differences in technique, no substantial change in size/appearance of a homogeneously enhancing extra-axial mass along the left frontal convexity, measuring approximately 3.7 x 3.0 x 3.7 cm.  Similar mass effect without significant surrounding brain edema.  No evidence of acute hemorrhage or hydrocephalus.  Impression and Plan:  Dorothy Powers has been referred for pre-anesthesia review and clearance prior to her undergoing the planned anesthetic and procedural courses. Available labs, pertinent testing, and imaging results were personally reviewed by me in preparation for upcoming operative/procedural course. [facility] medical record has been updated following extensive record review  and patient interview with PAT staff.   This patient has been appropriately cleared by cardiology with an overall ACCEPTABLE risk of experiencing significant perioperative cardiovascular complications. Based on clinical review performed today (09/30/23), barring any significant acute changes in the patient's overall  condition, it is anticipated that she will be able to proceed with the planned surgical intervention. Any acute changes in clinical condition may necessitate her procedure being postponed and/or cancelled. Patient will meet with anesthesia team (MD and/or CRNA) on the day of her procedure for preoperative evaluation/assessment. Questions regarding anesthetic course will be fielded at that time.   Pre-surgical instructions were reviewed with the patient during his PAT appointment, and questions were fielded to satisfaction by PAT clinical staff. She has been instructed on which medications that she will need to hold prior to surgery, as well as the ones that have been deemed safe/appropriate to take on the day of his procedure. As part of the general education provided by PAT, patient made aware both verbally and in writing, that she would need to abstain from the use of any illegal substances during his perioperative course. She was advised that failure to follow the provided instructions could necessitate case cancellation or result in serious perioperative complications up to and including death. Patient encouraged to contact PAT and/or her surgeon's office to discuss any questions or concerns that may arise prior to surgery; verbalized understanding.   Quentin Mulling, MSN, APRN, FNP-C, CEN [facility]  Perioperative Services Nurse Practitioner Phone: [phone] Fax: [phone] 09/30/23 2:29 PM  NOTE: This note has been prepared using Dragon dictation software. Despite my best ability to proofread, there is always the potential that unintentional transcriptional  errors may still occur from this process.

## 2023-10-03 ENCOUNTER — Observation Stay
Admission: RE | Admit: 2023-10-03 | Discharge: 2023-10-04 | Disposition: A | Discharge status: Home Health | Payer: Medicare Other | Attending: Internal Medicine | Admitting: Internal Medicine

## 2023-10-03 ENCOUNTER — Other Ambulatory Visit: Payer: Self-pay

## 2023-10-03 ENCOUNTER — Encounter
Admission: RE | Disposition: A | Discharge status: Home Health | Payer: Self-pay | Source: Home / Self Care | Attending: Internal Medicine

## 2023-10-03 ENCOUNTER — Ambulatory Visit

## 2023-10-03 ENCOUNTER — Ambulatory Visit: Payer: Self-pay | Admitting: Urgent Care

## 2023-10-03 ENCOUNTER — Encounter: Payer: Self-pay | Admitting: Podiatry

## 2023-10-03 DIAGNOSIS — I5032 Chronic diastolic (congestive) heart failure: Secondary | ICD-10-CM | POA: Diagnosis not present

## 2023-10-03 DIAGNOSIS — Z8673 Personal history of transient ischemic attack (TIA), and cerebral infarction without residual deficits: Secondary | ICD-10-CM | POA: Insufficient documentation

## 2023-10-03 DIAGNOSIS — M2041 Other hammer toe(s) (acquired), right foot: Secondary | ICD-10-CM | POA: Diagnosis not present

## 2023-10-03 DIAGNOSIS — M19271 Secondary osteoarthritis, right ankle and foot: Secondary | ICD-10-CM | POA: Diagnosis not present

## 2023-10-03 DIAGNOSIS — M1990 Unspecified osteoarthritis, unspecified site: Secondary | ICD-10-CM | POA: Diagnosis present

## 2023-10-03 DIAGNOSIS — I110 Hypertensive heart disease with heart failure: Secondary | ICD-10-CM | POA: Insufficient documentation

## 2023-10-03 DIAGNOSIS — M216X1 Other acquired deformities of right foot: Secondary | ICD-10-CM | POA: Diagnosis not present

## 2023-10-03 DIAGNOSIS — Z7901 Long term (current) use of anticoagulants: Secondary | ICD-10-CM | POA: Diagnosis not present

## 2023-10-03 DIAGNOSIS — M19071 Primary osteoarthritis, right ankle and foot: Secondary | ICD-10-CM | POA: Diagnosis not present

## 2023-10-03 DIAGNOSIS — G8918 Other acute postprocedural pain: Secondary | ICD-10-CM | POA: Diagnosis not present

## 2023-10-03 DIAGNOSIS — M7741 Metatarsalgia, right foot: Secondary | ICD-10-CM | POA: Diagnosis not present

## 2023-10-03 DIAGNOSIS — I10 Essential (primary) hypertension: Secondary | ICD-10-CM | POA: Insufficient documentation

## 2023-10-03 DIAGNOSIS — I480 Paroxysmal atrial fibrillation: Secondary | ICD-10-CM | POA: Diagnosis not present

## 2023-10-03 DIAGNOSIS — Z87891 Personal history of nicotine dependence: Secondary | ICD-10-CM | POA: Diagnosis not present

## 2023-10-03 DIAGNOSIS — M8970 Major osseous defect, unspecified site: Secondary | ICD-10-CM | POA: Diagnosis not present

## 2023-10-03 DIAGNOSIS — Z9889 Other specified postprocedural states: Secondary | ICD-10-CM | POA: Diagnosis not present

## 2023-10-03 DIAGNOSIS — Z79899 Other long term (current) drug therapy: Secondary | ICD-10-CM | POA: Diagnosis not present

## 2023-10-03 DIAGNOSIS — M205X1 Other deformities of toe(s) (acquired), right foot: Secondary | ICD-10-CM | POA: Insufficient documentation

## 2023-10-03 HISTORY — DX: Other long term (current) drug therapy: Z79.899

## 2023-10-03 HISTORY — PX: HAMMER TOE SURGERY: SHX385

## 2023-10-03 HISTORY — PX: HALLUX FUSION: SHX6621

## 2023-10-03 HISTORY — PX: GRAFT APPLICATION: SHX6696

## 2023-10-03 HISTORY — DX: Atherosclerosis of aorta: I70.0

## 2023-10-03 HISTORY — DX: Paroxysmal atrial fibrillation: I48.0

## 2023-10-03 HISTORY — DX: Other hammer toe(s) (acquired), right foot: M20.41

## 2023-10-03 HISTORY — PX: METATARSAL HEAD EXCISION: SHX5027

## 2023-10-03 HISTORY — DX: Unspecified osteoarthritis, unspecified site: M19.90

## 2023-10-03 HISTORY — DX: Unspecified diastolic (congestive) heart failure: I50.30

## 2023-10-03 LAB — BASIC METABOLIC PANEL
Anion gap: 11 (ref 5–15)
BUN: 21 mg/dL (ref 8–23)
CO2: 27 mmol/L (ref 22–32)
Calcium: 8.8 mg/dL — ABNORMAL LOW (ref 8.9–10.3)
Chloride: 101 mmol/L (ref 98–111)
Creatinine, Ser: 0.82 mg/dL (ref 0.44–1.00)
GFR, Estimated: >60 mL/min (ref >60)
Glucose, Bld: 98 mg/dL (ref 70–99)
Potassium: 4.1 mmol/L (ref 3.5–5.1)
Sodium: 139 mmol/L (ref 135–145)

## 2023-10-03 LAB — MAGNESIUM: Magnesium: 2.3 mg/dL (ref 1.7–2.4)

## 2023-10-03 SURGERY — EXCISION, METATARSAL BONE, HEAD
Anesthesia: General | Site: Toe | Laterality: Right

## 2023-10-03 MED ORDER — CEFAZOLIN SODIUM-DEXTROSE 2-4 GM/100ML-% IV SOLN
2.0000 g | INTRAVENOUS | Status: AC
  Administered 2023-10-03: 2 g via INTRAVENOUS

## 2023-10-03 MED ORDER — LIDOCAINE HCL (PF) 1 % IJ SOLN
INTRAMUSCULAR | Status: AC
  Filled 2023-10-03: qty 30

## 2023-10-03 MED ORDER — FENTANYL CITRATE (PF) 100 MCG/2ML IJ SOLN
INTRAMUSCULAR | Status: DC | PRN
  Administered 2023-10-03 (×2): 25 ug via INTRAVENOUS
  Administered 2023-10-03: 50 ug via INTRAVENOUS

## 2023-10-03 MED ORDER — PROPOFOL 10 MG/ML IV BOLUS
INTRAVENOUS | Status: DC | PRN
  Administered 2023-10-03: 100 ug/kg/min via INTRAVENOUS
  Administered 2023-10-03: 150 mg via INTRAVENOUS

## 2023-10-03 MED ORDER — ROSUVASTATIN CALCIUM 20 MG PO TABS
10.0000 mg | ORAL_TABLET | ORAL | Status: DC
  Administered 2023-10-04: 10 mg via ORAL
  Filled 2023-10-03: qty 1

## 2023-10-03 MED ORDER — DILTIAZEM HCL ER COATED BEADS 300 MG PO CP24
300.0000 mg | ORAL_CAPSULE | Freq: Every day | ORAL | Status: DC
  Administered 2023-10-04: 300 mg via ORAL
  Filled 2023-10-03: qty 1

## 2023-10-03 MED ORDER — LACTATED RINGERS IV SOLN
INTRAVENOUS | Status: DC | PRN
Start: 2023-10-03 — End: 2023-10-03

## 2023-10-03 MED ORDER — MIDAZOLAM HCL 2 MG/2ML IJ SOLN
1.0000 mg | Freq: Once | INTRAMUSCULAR | Status: AC
  Administered 2023-10-03: 1 mg via INTRAVENOUS

## 2023-10-03 MED ORDER — ONDANSETRON HCL 4 MG/2ML IJ SOLN
INTRAMUSCULAR | Status: DC | PRN
  Administered 2023-10-03: 4 mg via INTRAVENOUS

## 2023-10-03 MED ORDER — IPRATROPIUM BROMIDE 0.03 % NA SOLN
2.0000 | Freq: Two times a day (BID) | NASAL | Status: DC
  Administered 2023-10-04: 2 via NASAL
  Filled 2023-10-03: qty 30

## 2023-10-03 MED ORDER — ORAL CARE MOUTH RINSE: 15.0000 mL | Freq: Once | OROMUCOSAL | Status: AC

## 2023-10-03 MED ORDER — FENTANYL CITRATE (PF) 100 MCG/2ML IJ SOLN
INTRAMUSCULAR | Status: AC
  Filled 2023-10-03: qty 2

## 2023-10-03 MED ORDER — SODIUM CHLORIDE 0.9 % IR SOLN
Status: DC | PRN
Start: 2023-10-03 — End: 2023-10-03
  Administered 2023-10-03: 500 mL

## 2023-10-03 MED ORDER — APIXABAN 2.5 MG PO TABS
5.0000 mg | ORAL_TABLET | Freq: Two times a day (BID) | ORAL | Status: DC
  Administered 2023-10-04: 5 mg via ORAL
  Filled 2023-10-03: qty 2

## 2023-10-03 MED ORDER — PROPOFOL 1000 MG/100ML IV EMUL
INTRAVENOUS | Status: AC
  Filled 2023-10-03: qty 100

## 2023-10-03 MED ORDER — HEPARIN SODIUM (PORCINE) 5000 UNIT/ML IJ SOLN
INTRAMUSCULAR | Status: AC
  Filled 2023-10-03: qty 1

## 2023-10-03 MED ORDER — LORATADINE 10 MG PO TABS
10.0000 mg | ORAL_TABLET | Freq: Every day | ORAL | Status: DC
  Administered 2023-10-03 – 2023-10-04 (×2): 10 mg via ORAL
  Filled 2023-10-03 (×2): qty 1

## 2023-10-03 MED ORDER — EZETIMIBE 10 MG PO TABS
10.0000 mg | ORAL_TABLET | ORAL | Status: DC
  Administered 2023-10-04: 10 mg via ORAL
  Filled 2023-10-03: qty 1

## 2023-10-03 MED ORDER — CHLORHEXIDINE GLUCONATE 0.12 % MT SOLN
OROMUCOSAL | Status: AC
  Filled 2023-10-03: qty 15

## 2023-10-03 MED ORDER — FUROSEMIDE 20 MG PO TABS: 40.0000 mg | ORAL_TABLET | Freq: Every day | ORAL | Status: DC

## 2023-10-03 MED ORDER — ONDANSETRON HCL 4 MG/2ML IJ SOLN
INTRAMUSCULAR | Status: AC
  Filled 2023-10-03: qty 2

## 2023-10-03 MED ORDER — GLYCOPYRROLATE 0.2 MG/ML IJ SOLN
INTRAMUSCULAR | Status: DC | PRN
  Administered 2023-10-03: .2 mg via INTRAVENOUS

## 2023-10-03 MED ORDER — OXYCODONE HCL 5 MG PO TABS: 5.0000 mg | ORAL_TABLET | Freq: Three times a day (TID) | ORAL | Status: DC | PRN

## 2023-10-03 MED ORDER — BUPIVACAINE LIPOSOME 1.3 % IJ SUSP
INTRAMUSCULAR | Status: AC
  Filled 2023-10-03: qty 10

## 2023-10-03 MED ORDER — HYDROMORPHONE HCL 1 MG/ML IJ SOLN: 0.5000 mg | INTRAMUSCULAR | Status: DC | PRN

## 2023-10-03 MED ORDER — LIDOCAINE HCL (PF) 2 % IJ SOLN
INTRAMUSCULAR | Status: AC
  Filled 2023-10-03: qty 5

## 2023-10-03 MED ORDER — BUPIVACAINE LIPOSOME 1.3 % IJ SUSP
INTRAMUSCULAR | Status: DC | PRN
  Administered 2023-10-03: 10 mL

## 2023-10-03 MED ORDER — FUROSEMIDE 20 MG PO TABS
40.0000 mg | ORAL_TABLET | Freq: Every day | ORAL | Status: DC
  Administered 2023-10-04: 40 mg via ORAL
  Filled 2023-10-03: qty 2

## 2023-10-03 MED ORDER — FENTANYL CITRATE PF 50 MCG/ML IJ SOSY
50.0000 ug | PREFILLED_SYRINGE | Freq: Once | INTRAMUSCULAR | Status: AC
  Administered 2023-10-03: 50 ug via INTRAVENOUS

## 2023-10-03 MED ORDER — CHLORHEXIDINE GLUCONATE 0.12 % MT SOLN
15.0000 mL | Freq: Once | OROMUCOSAL | Status: AC
  Administered 2023-10-03: 15 mL via OROMUCOSAL

## 2023-10-03 MED ORDER — SODIUM CHLORIDE (PF) 0.9 % IJ SOLN
INTRAMUSCULAR | Status: AC
  Filled 2023-10-03: qty 10

## 2023-10-03 MED ORDER — ACETAMINOPHEN 10 MG/ML IV SOLN
INTRAVENOUS | Status: AC
  Filled 2023-10-03: qty 100

## 2023-10-03 MED ORDER — FENTANYL CITRATE PF 50 MCG/ML IJ SOSY
PREFILLED_SYRINGE | INTRAMUSCULAR | Status: AC
  Filled 2023-10-03: qty 1

## 2023-10-03 MED ORDER — BUPIVACAINE HCL (PF) 0.5 % IJ SOLN
INTRAMUSCULAR | Status: DC | PRN
  Administered 2023-10-03: 10 mL
  Administered 2023-10-03: 15 mL

## 2023-10-03 MED ORDER — LACTATED RINGERS IV SOLN: INTRAVENOUS | Status: DC

## 2023-10-03 MED ORDER — METOPROLOL TARTRATE 5 MG/5ML IV SOLN: 5.0000 mg | INTRAVENOUS | Status: DC | PRN

## 2023-10-03 MED ORDER — LIDOCAINE HCL (CARDIAC) PF 100 MG/5ML IV SOSY
PREFILLED_SYRINGE | INTRAVENOUS | Status: DC | PRN
  Administered 2023-10-03: 80 mg via INTRAVENOUS

## 2023-10-03 MED ORDER — DOXYCYCLINE HYCLATE 50 MG PO CAPS
50.0000 mg | ORAL_CAPSULE | Freq: Every morning | ORAL | Status: DC
  Administered 2023-10-04: 50 mg via ORAL
  Filled 2023-10-03: qty 1

## 2023-10-03 MED ORDER — CEFAZOLIN SODIUM-DEXTROSE 2-4 GM/100ML-% IV SOLN
INTRAVENOUS | Status: AC
  Filled 2023-10-03: qty 100

## 2023-10-03 MED ORDER — ACETAMINOPHEN 500 MG PO TABS
1000.0000 mg | ORAL_TABLET | Freq: Four times a day (QID) | ORAL | Status: DC | PRN
  Administered 2023-10-03 – 2023-10-04 (×3): 1000 mg via ORAL
  Filled 2023-10-03 (×3): qty 2

## 2023-10-03 MED ORDER — BUPIVACAINE HCL (PF) 0.5 % IJ SOLN
INTRAMUSCULAR | Status: AC
  Filled 2023-10-03: qty 30

## 2023-10-03 MED ORDER — ACETAMINOPHEN 10 MG/ML IV SOLN
INTRAVENOUS | Status: DC | PRN
  Administered 2023-10-03: 1000 mg via INTRAVENOUS

## 2023-10-03 MED ORDER — POTASSIUM CHLORIDE CRYS ER 20 MEQ PO TBCR
20.0000 meq | EXTENDED_RELEASE_TABLET | Freq: Every day | ORAL | Status: DC
  Administered 2023-10-04: 20 meq via ORAL
  Filled 2023-10-03: qty 1

## 2023-10-03 MED ORDER — MIDAZOLAM HCL 2 MG/2ML IJ SOLN
INTRAMUSCULAR | Status: AC
  Filled 2023-10-03: qty 2

## 2023-10-03 MED ORDER — DIPHENHYDRAMINE HCL 25 MG PO CAPS: 25.0000 mg | ORAL_CAPSULE | ORAL | Status: DC | PRN

## 2023-10-03 MED ORDER — PROPOFOL 10 MG/ML IV BOLUS
INTRAVENOUS | Status: AC
  Filled 2023-10-03: qty 20

## 2023-10-03 MED ORDER — AMIODARONE HCL 200 MG PO TABS
200.0000 mg | ORAL_TABLET | Freq: Every day | ORAL | Status: DC
  Administered 2023-10-04: 200 mg via ORAL
  Filled 2023-10-03: qty 1

## 2023-10-03 MED ORDER — PANTOPRAZOLE SODIUM 40 MG PO TBEC
40.0000 mg | DELAYED_RELEASE_TABLET | Freq: Every day | ORAL | Status: DC
  Administered 2023-10-04: 40 mg via ORAL
  Filled 2023-10-03: qty 1

## 2023-10-03 SURGICAL SUPPLY — 53 items
BIT DRILL 2.2 CANN STRGHT (BIT) IMPLANT
BIT DRILL CANN F/COMP 2.2 (BIT) IMPLANT
BLADE OSC/SAGITTAL MD 5.5X18 (BLADE) IMPLANT
BLADE SAW SAG MICRO THIN 65D (BLADE) ×2 IMPLANT
BLADE SURG 15 STRL LF DISP TIS (BLADE) ×4 IMPLANT
BLADE SW THK.38XMED LNG THN (BLADE) IMPLANT
BNDG COHESIVE 4X5 TAN STRL LF (GAUZE/BANDAGES/DRESSINGS) ×2 IMPLANT
BNDG ESMARCH 4X12 STRL LF (GAUZE/BANDAGES/DRESSINGS) ×2 IMPLANT
BNDG STRETCH GAUZE 3IN X12FT (GAUZE/BANDAGES/DRESSINGS) ×2 IMPLANT
BOOT STEPPER DURA MED (SOFTGOODS) IMPLANT
CHLORAPREP W/TINT 26 (MISCELLANEOUS) ×2 IMPLANT
COVER PIN YLW 0.028-062 (MISCELLANEOUS) IMPLANT
CUFF TOURN SGL QUICK 18X4 (TOURNIQUET CUFF) ×2 IMPLANT
DRAPE FLUOR MINI C-ARM 54X84 (DRAPES) ×2 IMPLANT
DRSG EMULSION OIL 3X3 NADH (GAUZE/BANDAGES/DRESSINGS) ×2 IMPLANT
ELECT REM PT RETURN 9FT ADLT (ELECTROSURGICAL) ×2 IMPLANT
ELECTRODE REM PT RTRN 9FT ADLT (ELECTROSURGICAL) ×2 IMPLANT
GAUZE PAD ABD 8X10 STRL (GAUZE/BANDAGES/DRESSINGS) ×2 IMPLANT
GAUZE SPONGE 4X4 12PLY STRL (GAUZE/BANDAGES/DRESSINGS) ×2 IMPLANT
GLOVE BIO SURGEON STRL SZ7.5 (GLOVE) IMPLANT
GLOVE BIOGEL M 7.0 STRL (GLOVE) ×2 IMPLANT
GLOVE BIOGEL PI IND STRL 7.5 (GLOVE) ×2 IMPLANT
GOWN STRL REUS W/ TWL LRG LVL3 (GOWN DISPOSABLE) ×2 IMPLANT
GUIDEWIRE .045XTROC TIP LSR LN (WIRE) IMPLANT
GUIDEWIRE 1.6 FLEX (Wire) IMPLANT
GUIDEWIRE ORTH 157X1.6XTROC (WIRE) IMPLANT
K-WIRE BB-TAK (WIRE) ×4 IMPLANT
KIT BONE MRW ASP ANGEL CPRP (KITS) IMPLANT
KIT PREVENA INCISION MGT 13 (CANNISTER) ×2 IMPLANT
KIT TURNOVER KIT A (KITS) ×2 IMPLANT
KWIRE BB-TAK (WIRE) IMPLANT
PACK EXTREMITY ARMC (MISCELLANEOUS) ×2 IMPLANT
PAD PREP OB/GYN DISP 24X41 (PERSONAL CARE ITEMS) ×2 IMPLANT
PLATE COMPR MTP 0D RT (Plate) IMPLANT
REAMER METATARSAL 18MM (MISCELLANEOUS) IMPLANT
REAMER PHALANGEAL 18MM (MISCELLANEOUS) IMPLANT
SCREW COMPR FT QF 3.5X24 (Screw) IMPLANT
SCREW CORTICAL 3.0X16 (Screw) IMPLANT
SCREW LOCK COMP 3X16 (Screw) IMPLANT
SCREW VAL KREULOCK 3.0X12 TI (Screw) IMPLANT
SCREW VAL KREULOCK 3.0X18 TI (Screw) IMPLANT
SLEEVE SCD COMPRESS KNEE MED (STOCKING) ×2 IMPLANT
SPONGE T-LAP 18X18 ~~LOC~~+RFID (SPONGE) IMPLANT
STAPLER SKIN PROX 35W (STAPLE) ×2 IMPLANT
STOCKINETTE ORTHO 6X25 (MISCELLANEOUS) ×2 IMPLANT
SURGILUBE 2OZ TUBE FLIPTOP (MISCELLANEOUS) ×2 IMPLANT
SUT ETHILON 4-0 FS2 18XMFL BLK (SUTURE) ×6 IMPLANT
SUT MNCRL AB 3-0 PS2 27 (SUTURE) ×2 IMPLANT
SUT STRATA 4-0 30 PS-2 (SUTURE) IMPLANT
SUT VIC AB 3-0 SH 27X BRD (SUTURE) IMPLANT
SUTURE ETHLN 4-0 FS2 18XMF BLK (SUTURE) IMPLANT
SYR 20ML LL LF (SYRINGE) IMPLANT
SYR CONTROL 10ML LL (SYRINGE) ×2 IMPLANT

## 2023-10-03 NOTE — Anesthesia Procedure Notes (Signed)
 Anesthesia Regional Block: Popliteal block   Pre-Anesthetic Checklist: , timeout performed,  Correct Patient, Correct Site, Correct Laterality,  Correct Procedure, Correct Position, site marked,  Risks and benefits discussed,  Surgical consent,  Pre-op evaluation,  At surgeon's request and post-op pain management  Laterality: Lower and Right  Prep: chloraprep       Needles:  Injection technique: Single-shot  Needle Type: Echogenic Needle     Needle Length: 9cm  Needle Gauge: 21     Additional Needles:   Procedures:,,,, ultrasound used (permanent image in chart),,    Narrative:  Start time: 10/03/2023 7:31 AM End time: 10/03/2023 7:33 AM Injection made incrementally with aspirations every 5 mL.  Performed by: Personally  Anesthesiologist: Stephanie Coup, MD  Additional Notes: Patient's chart reviewed and they were deemed appropriate candidate for procedure, at surgeon's request. Patient educated about risks, benefits, and alternatives of the block including but not limited to: temporary or permanent nerve damage, bleeding, infection, damage to surround tissues, block failure, local anesthetic toxicity. Patient expressed understanding. A formal time-out was conducted consistent with institution rules.  Monitors were applied, and minimal sedation used. The site was prepped with skin prep and allowed to dry, and sterile gloves were used. A high frequency linear ultrasound probe with probe cover was utilized throughout. Popliteal artery pulsatile and visualized in popliteal fossa along with adjacent sciatic nerve and its branch point, which appeared anatomically normal, local anesthetic injected around them just proximal to the branch point, and echogenic block needle trajectory was monitored throughout. Aspiration performed every 5ml. Blood vessels were avoided. All injections were performed without resistance and free of blood and paresthesias. The patient tolerated the procedure well.  A picture of the nerve block was added to the patient's chart.  Injectate: 10cc of exparel and 10cc of 0.5% bupivacaine

## 2023-10-03 NOTE — Progress Notes (Signed)
       CROSS COVER NOTE  NAME: Dorothy Powers MRN: [id number] DOB : [birth year] ATTENDING PHYSICIAN: Emeline General, MD    Date of Service   10/03/2023   HPI/Events of Note   Hi Steward Drone, This pt underwent elective surgery including metatarsal head excision 2-5 and hammertoe correction and fusion of joint of great toe and bone graft (right foot). Im not sure why she has no pain medication at all in her chart but she is in pain :) can we get something PRN for her please!   Interventions   Assessment/Plan: Current pain level 5 Acetaminophen 100 q6h prn mild to moderate pain Oxycodone every 8 h prn severe pain Hydromorphone 0.5 q 2h prn breakthrough pain        Donnie Mesa NP [facility] Cross Cover 7pm-7am - check amion for availability Pager [phone]

## 2023-10-03 NOTE — Transfer of Care (Signed)
 Immediate Anesthesia Transfer of Care Note  Patient: Dorothy Powers  Procedure(s) Performed: METATARSAL HEAD EXCISION 2-5 (Right: Toe) HAMMER TOE CORRECTION 2-5 (Right: Toe) FUSION, JOINT, GREAT TOE (Right) BONE GRAFT (Right)  Patient Location: PACU  Anesthesia Type:General  Level of Consciousness: drowsy  Airway & Oxygen Therapy: Patient Spontanous Breathing and Patient connected to face mask oxygen  Post-op Assessment: Report given to RN and Post -op Vital signs reviewed and stable  Post vital signs: Reviewed and stable  Last Vitals:  Vitals Value Taken Time  BP 122/59 10/03/23 1145  Temp    Pulse 115 10/03/23 1148  Resp 22 10/03/23 1148  SpO2 99 % 10/03/23 1148  Vitals shown include unfiled device data.  Last Pain:  Vitals:   10/03/23 0726  TempSrc:   PainSc: 0-No pain         Complications: There were no known notable events for this encounter.

## 2023-10-03 NOTE — Interval H&P Note (Signed)
 History and Physical Interval Note:  10/03/2023 7:19 AM  Dorothy Powers  has presented today for surgery, with the diagnosis of Hammertoe Metatarsal deformity Osteoarthritis of first metatarsophalangeal joint  Metatarsalgia.  The various methods of treatment have been discussed with the patient and family. After consideration of risks, benefits and other options for treatment, the patient has consented to  Procedure(s) with comments: METATARSAL HEAD EXCISION 2-5 (Right) - pre op block HAMMER TOE CORRECTION 2-5 (Right) FUSION, JOINT, GREAT TOE (Right) BONE GRAFT (Right) as a surgical intervention.  The patient's history has been reviewed, patient examined, no change in status, stable for surgery.  I have reviewed the patient's chart and labs.  Questions were answered to the patient's satisfaction.     Edwin Cap

## 2023-10-03 NOTE — Anesthesia Preprocedure Evaluation (Signed)
 Anesthesia Evaluation  Patient identified by MRN, date of birth, ID band Patient awake    Reviewed: Allergy & Precautions, NPO status , Patient's Chart, lab work & pertinent test results  History of Anesthesia Complications Negative for: history of anesthetic complications  Airway Mallampati: III  TM Distance: >3 FB Neck ROM: full    Dental no notable dental hx. (+) Chipped, Dental Advidsory Given   Pulmonary neg pulmonary ROS, former smoker   Pulmonary exam normal        Cardiovascular hypertension, Pt. on medications and Pt. on home beta blockers +CHF  + dysrhythmias Atrial Fibrillation  Rhythm:Irregular Rate:Tachycardia  IMPRESSIONS   1. Left ventricular ejection fraction, by estimation, is 60 to 65%. The left ventricle has normal function. The left ventricle has no regional wall motion abnormalities. 2. Right ventricular systolic function is normal. The right ventricular size is normal. 3. Small pericardial effusion. There is no RV or RA diastolic collapse. There is respiratory inflow variation across the tricuspid valve but not across the mitral valve. The IVC is dilated and does not collapse with inspiration. Findings are  indeterminate for tamponade. However, given the lack of RV collapse or mitral valve flow variation, this likely represents volume overload rather than tamponade. a small pericardial effusion is present. The pericardial effusion is circumferential. There  is no evidence of cardiac tamponade. 4. The mitral valve is normal in structure. No evidence of mitral valve regurgitation. No evidence of mitral stenosis. 5. The aortic valve is tricuspid. There is moderate calcification of the aortic valve. There is moderate thickening of the aortic valve. Aortic valve regurgitation is not visualized. No aortic stenosis is present. 6. The inferior vena cava is dilated in size with <50% respiratory variability,  suggesting right atrial pressure of 15 mmHg.    Neuro/Psych negative neurological ROS  negative psych ROS   GI/Hepatic Neg liver ROS,GERD  ,,  Endo/Other  negative endocrine ROS    Renal/GU negative Renal ROS  negative genitourinary   Musculoskeletal   Abdominal   Peds  Hematology negative hematology ROS (+)   Anesthesia Other Findings Note per Quentin Mulling:   Dorothy Powers is a 78 y.o. female who is submitted for pre-surgical anesthesia review and clearance prior to her undergoing the above procedure. Patient is a Former Games developer. Pertinent PMH includes: PAF, HFpEF, TIA, aortic atherosclerosis, tachycardia, meningioma, RBBB, HTN, HLD, GERD (on daily PPI), OA, hammertoes of the RIGHT foot.   Patient is followed by cardiology Eden Emms, MD). She was last seen in the cardiology clinic on 09/04/2023; notes reviewed. At the time of her clinic visit, patient doing well overall from a cardiovascular perspective. Patient denied any chest pain, shortness of breath, PND, orthopnea, palpitations, significant peripheral edema, weakness, fatigue, vertiginous symptoms, or presyncope/syncope. Patient with a past medical history significant for cardiovascular diagnoses. Documented physical exam was grossly benign, providing no evidence of acute exacerbation and/or decompensation of the patient's known cardiovascular conditions.   Patient with an atrial fibrillation diagnosis; CHA2DS2-VASc Score = 6 (age x 2, sex, HFpEF, HTN, vascular disease).patient underwent DCCV procedure on 02/01/2022, at which time she received a single 150 J cardioversion restoring her rhythm to NSR with PACs.  Her rate and rhythm are currently being maintained on oral amiodarone + diltiazem. She is chronically anticoagulated using apixaban; reported to be compliant with therapy with no evidence or reports of GI/GU bleeding.  Blood pressure reasonably controlled at 140/64 mmHg on currently prescribed CCB (diltiazem) and diuretic  (furosemide) therapies. She  is on rosuvastatin for her HLD diagnosis and further ASCVD prevention.  Patient is not diabetic. Patient does not have an OSAH diagnosis.  Patient reports that she endeavors to maintain an active lifestyle, however due to podiatric pain, her abilities have been limited.  Patient is able to complete all ADLs/IADLs without cardiovascular limitation.  Per the DASI, patient is able to exceed 4 METS of physical activity without experiencing any significant degree of angina/anginal equivalent symptoms.  No changes were made to her medication regimen.  Given patient's history of PAF and prior pericardial effusion, the decision was made to repeat TTE prior to providing clearance for upcoming podiatric surgery.  Patient to follow-up with outpatient cardiology in 1 year or sooner if needed.   Since patient was last seen by cardiology, she has undergone the recommended noninvasive cardiovascular testing.    Most recent TTE performed on 09/19/2023 revealed a normal left ventricular systolic function with an EF of 60-65%. There were no regional wall motion abnormalities. Left ventricular diastolic Doppler parameters consistent with pseudonormalization (G2DD). Left atrium was moderately and right atrium was mildly dilated right ventricular size and function normal with a TAPSE measuring 3.0 cm  (normal range >/= 1.6 cm). There was trivial to mild tricuspid and pulmonary valve regurgitation. Aortic valve mildly calcified. All transvalvular gradients were noted to be normal providing no evidence suggestive of valvular stenosis. Aorta normal in size with no evidence of ectasia or aneurysmal dilatation.   Dorothy Powers is scheduled for an elective METATARSAL HEAD EXCISION 2-5 (Right: Toe); HAMMER TOE CORRECTION 2-5 (Right: Toe); FUSION, JOINT, GREAT TOE (Right); BONE GRAFT (Right) on 10/03/2023 with Dr. Sharl Ma, DPM.  Given patient's past medical history significant for cardiovascular  diagnoses, presurgical cardiac clearance was sought by the PAT team. Per cardiology, "based ACC/AHA guidelines, the patient's past medical history, and the amount of time since her last clinic visit, this patient would be at an overall ACCEPTABLE risk for the planned procedure without further cardiovascular testing or intervention at this time".    Again, this patient is on daily oral anticoagulation therapy using a DOAC medication.  She has been instructed on recommendations for holding her apixaban for 2 days prior to her procedure with plans to restart as soon as postoperative bleeding risk felt to be minimized by his attending surgeon. The patient has been instructed that her last dose of apixaban should be on 09/30/2023.   Patient denies previous perioperative complications with anesthesia in the past. In review her EMR, it is noted that patient underwent a general anesthetic course at [facility] (ASA III) in 01/2022 without documented complications.    Reproductive/Obstetrics negative OB ROS                             Anesthesia Physical Anesthesia Plan  ASA: 3  Anesthesia Plan: General   Post-op Pain Management:    Induction: Intravenous  PONV Risk Score and Plan: 3 and Propofol infusion, TIVA and Ondansetron  Airway Management Planned: Oral ETT  Additional Equipment:   Intra-op Plan:   Post-operative Plan: Extubation in OR  Informed Consent: I have reviewed the patients History and Physical, chart, labs and discussed the procedure including the risks, benefits and alternatives for the proposed anesthesia with the patient or authorized representative who has indicated his/her understanding and acceptance.     Dental Advisory Given  Plan Discussed with: Anesthesiologist, CRNA and Surgeon  Anesthesia Plan Comments: (  Patient consented for risks of anesthesia including but not limited to:  - adverse reactions to medications - damage to  eyes, teeth, lips or other oral mucosa - nerve damage due to positioning  - sore throat or hoarseness - Damage to heart, brain, nerves, lungs, other parts of body or loss of life  Patient voiced understanding and assent.)       Anesthesia Quick Evaluation

## 2023-10-03 NOTE — Discharge Instructions (Signed)
 Post-Surgery Instructions  1. If you are recuperating from surgery anywhere other than home, please be sure to leave [place] a number where you can be reached. 2. Go directly home and rest. 3. The keep operated foot (or feet) elevated six inches above the hip when sitting or lying down. 4. Support the elevated foot and leg with pillows under the calf. DO NOT PLACE PILLOWS UNDER THE KNEE. 5. DO NOT REMOVE or get your bandages wet. This will increase your chances of getting an infection. 6. Wear your surgical shoe at all times when you are up. 7. A limited amount of pain and swelling may occur. The skin may take on a bruised appearance. This is no cause for alarm. 8. For slight pain and swelling, apply an ice pack directly over the bandage for 15 minutes every hour. Continue icing until seen in the office. DO NOT apply any form of heat to the area. 9. Have prescription(s) filled immediately and take as directed. 10. Drink lots of liquids, water, and juice. 11. CALL THE OFFICE IMMEDIATELY IF: a. Bleeding continues b. Pain increases and/or does not respond to medication c. Bandage or cast appears too tight d. Any liquids (water, coffee, etc.) have spilled on your bandages. e. Tripping, falling, or stubbing the surgical foot f. If your temperature rises above 101 g. If you have ANY questions at all 12. Please use the crutches, knee scooter, or walker you have prescribed, rented, or purchased. If you are non-weight bearing DO NOT put weight on the operated foot for _________ days. If you are weight-bearing, follow your physician's instructions. You are expected to be: Weightbearing to the heel in the boot only. Rest as much as possible 13. Special Instructions: Put ice behind the knee  _________________________________________________________________________________ _________________________________________________________________________________  14. Your next appointment is: 10/09/2023 1:15  PM   If you need to reach the nurse for any reason, please call: [place]: [phone] [place]: [phone] [place]: [phone]

## 2023-10-03 NOTE — H&P (Signed)
 History and Physical    Dorothy Powers XLK:[id number] DOB: 1946/03/06 DOA: 10/03/2023  PCP: Joycelyn Rua, MD (Confirm with patient/family/NH records and if not entered, this has to be entered at [facility] point of entry) Patient coming from: Home  I have personally briefly reviewed patient's old medical records in [facility]  Chief Complaint: Feeling ok  HPI: Dorothy Powers is a 77 y.o. female with medical history significant of PAF on Eliquis, HTN, HLD, hammertoe of right foot and severe OA of right foot presented with elective surgery of right foot deformity.  Patient has no complaints.  This morning, patient underwent elective surgery including metatarsal head excision 2-5 and hammertoe correction and fusion of joint of great toe and bone graft.  Patient tolerated procedure well.  After the procedure however during the recovery room stay, it was noticed patient started develop episode of in and out A-fib.  Patient has no complaints of chest pain or palpitations or shortness of breath.    Review of Systems: As per HPI otherwise 14 point review of systems negative.    Past Medical History:  Diagnosis Date   (HFpEF) heart failure with preserved ejection fraction (HCC)    Aortic atherosclerosis (HCC)    CAP (community acquired pneumonia)    GERD (gastroesophageal reflux disease)    Hammertoe of right foot    Heart murmur    History of colonic polyps    Hyperlipidemia    Hypertension    Hypokalemia    Long term current use of amiodarone    Meningioma (HCC)    Morbid obesity (HCC)    OA (osteoarthritis)    On apixaban therapy    Osteoarthritis    PAF (paroxysmal atrial fibrillation) (HCC)    a.) CHA2DS2VASc = 6 (age x 2, sex, HFpEF, HTN, vascular disease) as of 09/30/2023; b.) s/p DCCV  02/01/2022 --> 150 J x 1 --> NSR with PACs; c. rate/rhythm maintained on oral amiodarone + diltiazem; chronically anticoagulated with apixaban   RBBB (right bundle branch block)     Rosacea    Tachycardia    TIA (transient ischemic attack) 2021    Past Surgical History:  Procedure Laterality Date   ABDOMINAL HYSTERECTOMY     BLADDER SURGERY     BUNIONECTOMY Right 11/2020   BUNIONECTOMY Right    corrective surgery from 1st bunion surgery   CARDIOVERSION N/A 02/01/2022   Procedure: CARDIOVERSION;  Surgeon: Chilton Si, MD;  Location: [facility] ENDOSCOPY;  Service: Cardiovascular;  Laterality: N/A;   COLONOSCOPY       reports that she has quit smoking. Her smoking use included cigarettes. She has never used smokeless tobacco. She reports current alcohol use. She reports that she does not use drugs.  Allergies  Allergen Reactions   Codeine Other (See Comments)    "Couldn't move one night for a short time, after taking the medicaiton"   Lovastatin Other (See Comments)    Leg pain   Sulfa Antibiotics Other (See Comments)    "Felt weak"    Family History  Problem Relation Age of Onset   Heart disease Mother        pacemaker   Heart attack Father      Prior to Admission medications   Medication Sig Start Date End Date Taking? Authorizing Provider  amiodarone (PACERONE) 200 MG tablet Take 1 tablet by mouth once daily 02/26/23  Yes Tillery, Mariam Dollar, PA-C  apixaban (ELIQUIS) 5 MG TABS tablet Take 1 tablet by mouth twice  daily 02/26/23  Yes Lanier Prude, MD  cetirizine (ZYRTEC) 10 MG tablet Take 10 mg by mouth at bedtime.   Yes [provider]  diltiazem (TIAZAC) 300 MG 24 hr capsule Take 1 capsule by mouth once daily 02/26/23  Yes Tillery, Mariam Dollar, PA-C  diphenhydrAMINE (BENADRYL) 25 MG tablet Take 25-50 mg by mouth as needed for allergies.   Yes [provider]  doxycycline (VIBRAMYCIN) 50 MG capsule Take 50 mg by mouth in the morning. Rosacea   Yes [provider]  ezetimibe (ZETIA) 10 MG tablet Take 10 mg by mouth every morning. 06/27/20  Yes [provider]  furosemide (LASIX) 40 MG tablet Take 1 tablet  by mouth once daily Patient taking differently: Take 40 mg by mouth at bedtime. 02/26/23  Yes Graciella Freer, PA-C  ipratropium (ATROVENT) 0.03 % nasal spray Place 2 sprays into both nostrils 2 (two) times daily. 07/22/23  Yes [provider]  pantoprazole (PROTONIX) 40 MG tablet Take 40 mg by mouth every morning. 09/20/20  Yes [provider]  Polyethyl Glycol-Propyl Glycol (SYSTANE OP) Place 2 drops into both eyes 2 (two) times daily.   Yes [provider]  potassium chloride SA (KLOR-CON M20) 20 MEQ tablet Take 1 tablet by mouth once daily 05/26/23  Yes Tillery, Mariam Dollar, PA-C  rosuvastatin (CRESTOR) 10 MG tablet Take 10 mg by mouth every morning.   Yes [provider]  albuterol (VENTOLIN HFA) 108 (90 Base) MCG/ACT inhaler Inhale 1-2 puffs into the lungs every 6 (six) hours as needed for wheezing. Patient not taking: Reported on 10/03/2023    [provider]  Ascorbic Acid (VITAMIN C PO) Take 500 mg by mouth in the morning.    [provider]  Calcium Carbonate-Vitamin D (CALCIUM-VITAMIN D) 600-3.125 MG-MCG TABS Take 2 tablets by mouth at bedtime.    [provider]  Cholecalciferol (VITAMIN D3) 125 MCG (5000 UT) TABS Take 5,000 Units by mouth daily.    [provider]  Coenzyme Q10 (CO Q 10 PO) Take 200 mg by mouth in the morning.    [provider]  Misc Natural Products (OSTEO BI-FLEX ADV TRIPLE ST PO) Take 1 tablet by mouth in the morning and at bedtime.    [provider]    Physical Exam: Vitals:   10/03/23 0731 10/03/23 0736 10/03/23 1146 10/03/23 1200  BP: (!) 123/47 (!) 137/53 (!) 122/59 121/86  Pulse: 63 65 61 62  Resp: 16 18 18 14   Temp:   (!) 97.4 F (36.3 C)   TempSrc:      SpO2: 95% 96% 100% 100%  Weight:      Height:        Constitutional: NAD, calm, comfortable Vitals:   10/03/23 0731 10/03/23 0736 10/03/23 1146 10/03/23 1200  BP: (!) 123/47 (!) 137/53 (!) 122/59  121/86  Pulse: 63 65 61 62  Resp: 16 18 18 14   Temp:   (!) 97.4 F (36.3 C)   TempSrc:      SpO2: 95% 96% 100% 100%  Weight:      Height:       Eyes: PERRL, lids and conjunctivae normal ENMT: Mucous membranes are moist. Posterior pharynx clear of any exudate or lesions.Normal dentition.  Neck: normal, supple, no masses, no thyromegaly Respiratory: clear to auscultation bilaterally, no wheezing, no crackles. Normal respiratory effort. No accessory muscle use.  Cardiovascular: Regular rate and rhythm, no murmurs / rubs / gallops. No extremity edema. 2+  pedal pulses. No carotid bruits.  Abdomen: no tenderness, no masses palpated. No hepatosplenomegaly. Bowel sounds positive.  Musculoskeletal: Right foot in a short foot cast and surgical dressing: No bleeding Skin: no rashes, lesions, ulcers. No induration Neurologic: CN 2-12 grossly intact. Sensation intact, DTR normal. Strength 5/5 in all 4.  Psychiatric: Normal judgment and insight. Alert and oriented x 3. Normal mood.     Labs on Admission: I have personally reviewed following labs and imaging studies  CBC: No results for input(s): "WBC", "NEUTROABS", "HGB", "HCT", "MCV", "PLT" in the last 168 hours. Basic Metabolic Panel: No results for input(s): "NA", "K", "CL", "CO2", "GLUCOSE", "BUN", "CREATININE", "CALCIUM", "MG", "PHOS" in the last 168 hours. GFR: Estimated Creatinine Clearance: 56.5 mL/min (by C-G formula based on SCr of 0.82 mg/dL). Liver Function Tests: No results for input(s): "AST", "ALT", "ALKPHOS", "BILITOT", "PROT", "ALBUMIN" in the last 168 hours. No results for input(s): "LIPASE", "AMYLASE" in the last 168 hours. No results for input(s): "AMMONIA" in the last 168 hours. Coagulation Profile: No results for input(s): "INR", "PROTIME" in the last 168 hours. Cardiac Enzymes: No results for input(s): "CKTOTAL", "CKMB", "CKMBINDEX", "TROPONINI" in the last 168 hours. BNP (last 3 results) No results for input(s):  "PROBNP" in the last 8760 hours. HbA1C: No results for input(s): "HGBA1C" in the last 72 hours. CBG: No results for input(s): "GLUCAP" in the last 168 hours. Lipid Profile: No results for input(s): "CHOL", "HDL", "LDLCALC", "TRIG", "CHOLHDL", "LDLDIRECT" in the last 72 hours. Thyroid Function Tests: No results for input(s): "TSH", "T4TOTAL", "FREET4", "T3FREE", "THYROIDAB" in the last 72 hours. Anemia Panel: No results for input(s): "VITAMINB12", "FOLATE", "FERRITIN", "TIBC", "IRON", "RETICCTPCT" in the last 72 hours. Urine analysis:    Component Value Date/Time   COLORURINE STRAW (A) 01/31/2022 0105   APPEARANCEUR CLEAR 01/31/2022 0105   LABSPEC 1.005 01/31/2022 0105   PHURINE 7.0 01/31/2022 0105   GLUCOSEU NEGATIVE 01/31/2022 0105   HGBUR NEGATIVE 01/31/2022 0105   BILIRUBINUR NEGATIVE 01/31/2022 0105   KETONESUR NEGATIVE 01/31/2022 0105   PROTEINUR NEGATIVE 01/31/2022 0105   NITRITE NEGATIVE 01/31/2022 0105   LEUKOCYTESUR MODERATE (A) 01/31/2022 0105    Radiological Exams on Admission: DG MINI C-ARM IMAGE ONLY Result Date: 10/03/2023 There is no interpretation for this exam.  This order is for images obtained during a surgical procedure.  Please See "Surgeries" Tab for more information regarding the procedure.   [place] OR NERVE BLOCK-IMAGE ONLY [facility]) Result Date: 10/03/2023 There is no interpretation for this exam.  This order is for images obtained during a surgical procedure.  Please See "Surgeries" Tab for more information regarding the procedure.    EKG: Ordered  Assessment/Plan Principal Problem:   Status post foot surgery Active Problems:   Osteoarthritis  (please populate well all problems here in Problem List. (For example, if patient is on BP meds at home and you resume or decide to hold them, it is a problem that needs to be her. Same for CAD, COPD, HLD and so on)  PAF -PACU nurse reported at least 1 episode of rapid A-fib however at the time I saw the patient  monitor showed regular sinus rhythm.  Will keep patient on the monitor overnight.  Start as needed Lopressor for breakthrough heart rate. -Resume amiodarone, Cardizem and as per recommendation from podiatry, will resume Eliquis tomorrow morning. -Check BMP and magnesium level -Ordered postop EKG  Right foot hammertoe and severe osteoarthritis and deformity -Had metatarsal head excision 2-5 and hammertoe correction and fusion of  joint of great toe and bone graft. -Ordered.  EKG -Weightbearing and PT evaluation as per podiatry team  HTN and chronic HFpEF -Euvolemic, blood pressure medication as above -Resume Lasix tomorrow  Rosacea -Continue outpatient doxycycline  DVT prophylaxis: Eliquis, resuming home dose tomorrow Code Status: Full code Family Communication: None at bedside Disposition Plan: Expect less than 2 midnight hospital stay Consults called: Podiatry Admission status: Telemetry observation   Emeline General MD [facility] Pager [phone]  10/03/2023, 12:20 PM

## 2023-10-03 NOTE — Anesthesia Procedure Notes (Signed)
 Anesthesia Regional Block: Adductor canal block   Pre-Anesthetic Checklist: , timeout performed,  Correct Patient, Correct Site, Correct Laterality,  Correct Procedure, Correct Position, site marked,  Risks and benefits discussed,  Surgical consent,  Pre-op evaluation,  At surgeon's request and post-op pain management  Laterality: Lower and Right  Prep: chloraprep       Needles:  Injection technique: Single-shot  Needle Type: Echogenic Needle     Needle Length: 9cm  Needle Gauge: 21     Additional Needles:   Procedures:,,,, ultrasound used (permanent image in chart),,    Narrative:  Start time: 10/03/2023 7:33 AM End time: 10/03/2023 7:35 AM Injection made incrementally with aspirations every 5 mL.  Performed by: Personally  Anesthesiologist: Stephanie Coup, MD  Additional Notes: Patient's chart reviewed and they were deemed appropriate candidate for procedure, per surgeon's request. Patient educated about risks, benefits, and alternatives of the block including but not limited to: temporary or permanent nerve damage, bleeding, infection, damage to surround tissues, block failure, local anesthetic toxicity. Patient expressed understanding. A formal time-out was conducted consistent with institution rules.  Monitors were applied, and minimal sedation used (see nursing record). The site was prepped with skin prep and allowed to dry, and sterile gloves were used. A high frequency linear ultrasound probe with probe cover was utilized throughout. Femoral artery visualized at mid-thigh level, local anesthetic injected anterolateral to it, and echogenic block needle trajectory was monitored throughout. Hydrodissection of saphenous nerve visualized and appeared anatomically normal. Aspiration performed every 5ml. Blood vessels were avoided. All injections were performed without resistance and free of blood and paresthesias. The patient tolerated the procedure well. A picture of the nerve  block was added to the patient's chart.  Injectate: 15cc of 0.5% bupivacaine

## 2023-10-03 NOTE — Anesthesia Postprocedure Evaluation (Signed)
 Anesthesia Post Note  Patient: Dorothy Powers  Procedure(s) Performed: METATARSAL HEAD EXCISION 2-5 (Right: Toe) HAMMER TOE CORRECTION 2-5 (Right: Toe) FUSION, JOINT, GREAT TOE (Right) BONE GRAFT (Right)  Patient location during evaluation: PACU Anesthesia Type: General Level of consciousness: awake and alert Pain management: pain level controlled Vital Signs Assessment: post-procedure vital signs reviewed and stable Respiratory status: spontaneous breathing, nonlabored ventilation, respiratory function stable and patient connected to nasal cannula oxygen Cardiovascular status: blood pressure returned to baseline and stable Postop Assessment: no apparent nausea or vomiting Anesthetic complications: no  There were no known notable events for this encounter.   Last Vitals:  Vitals:   10/03/23 1330 10/03/23 1345  BP: (!) 128/52 133/74  Pulse: (!) 53 (!) 52  Resp: 16 13  Temp:    SpO2: 95% 96%    Last Pain:  Vitals:   10/03/23 1345  TempSrc:   PainSc: Asleep                 Stephanie Coup

## 2023-10-03 NOTE — Brief Op Note (Signed)
 10/03/2023  2:43 PM  PATIENT:  Dorothy Powers  78 y.o. female  PRE-OPERATIVE DIAGNOSIS:  Hammertoe Metatarsal deformity Osteoarthritis of first metatarsophalangeal joint  Metatarsalgia  POST-OPERATIVE DIAGNOSIS:  HammertoeMetatarsal deformityOsteoarthritis of first metatarsophalangeal joint Metatarsalgia  PROCEDURE:  Procedure(s) with comments: METATARSAL HEAD EXCISION 2-5 (Right) - pre op block HAMMER TOE CORRECTION 2-5 (Right) FUSION, JOINT, GREAT TOE (Right) BONE GRAFT (Right)  SURGEON:  Surgeons and Role:    * Edwin Cap, DPM - Primary  PHYSICIAN ASSISTANT:   ASSISTANTS: none   ANESTHESIA:   regional and general  EBL:  10 mL   BLOOD ADMINISTERED:none  DRAINS: none   LOCAL MEDICATIONS USED:  none  SPECIMEN:  No Specimen  DISPOSITION OF SPECIMEN:  N/A  COUNTS:  YES  TOURNIQUET:   Total Tourniquet Time Documented: Calf (Right) - 7 minutes Calf (Right) - 100 minutes Total: Calf (Right) - 107 minutes   DICTATION: .Note written in EPIC  PLAN OF CARE: Admit for overnight observation  PATIENT DISPOSITION:  PACU - hemodynamically stable.   Delay start of Pharmacological VTE agent (>24hrs) due to surgical blood loss or risk of bleeding: Start Eliquis 3/22 AM Dose

## 2023-10-04 DIAGNOSIS — I480 Paroxysmal atrial fibrillation: Secondary | ICD-10-CM

## 2023-10-04 DIAGNOSIS — I10 Essential (primary) hypertension: Secondary | ICD-10-CM | POA: Diagnosis not present

## 2023-10-04 DIAGNOSIS — M19071 Primary osteoarthritis, right ankle and foot: Secondary | ICD-10-CM | POA: Diagnosis not present

## 2023-10-04 DIAGNOSIS — M2041 Other hammer toe(s) (acquired), right foot: Secondary | ICD-10-CM | POA: Diagnosis not present

## 2023-10-04 DIAGNOSIS — Z8673 Personal history of transient ischemic attack (TIA), and cerebral infarction without residual deficits: Secondary | ICD-10-CM | POA: Diagnosis not present

## 2023-10-04 DIAGNOSIS — M7741 Metatarsalgia, right foot: Secondary | ICD-10-CM | POA: Diagnosis not present

## 2023-10-04 DIAGNOSIS — M205X1 Other deformities of toe(s) (acquired), right foot: Secondary | ICD-10-CM | POA: Diagnosis not present

## 2023-10-04 DIAGNOSIS — M8970 Major osseous defect, unspecified site: Secondary | ICD-10-CM

## 2023-10-04 MED ORDER — OXYCODONE HCL 5 MG PO TABS: 5.0000 mg | ORAL_TABLET | ORAL | 0 refills | Status: AC | PRN

## 2023-10-04 MED ORDER — ACETAMINOPHEN 500 MG PO TABS: 1000.0000 mg | ORAL_TABLET | Freq: Four times a day (QID) | ORAL | 0 refills | Status: AC | PRN

## 2023-10-04 NOTE — Discharge Summary (Signed)
 Physician Discharge Summary  CAMPBELL AGRAMONTE ZOX:[id number] DOB: 01/14/1946 DOA: 10/03/2023  PCP: Joycelyn Rua, MD  Admit date: 10/03/2023 Discharge date: 10/04/2023  Admitted From: home  Disposition:  home w/ home health   Recommendations for Outpatient Follow-up:  Follow up with PCP in 1-2 weeks F/u w/ podiatry, Dr. Lilian Kapur, w/in 1 week  Home Health: yes Equipment/Devices:  Discharge Condition: stable  CODE STATUS: full  Diet recommendation: Heart Healthy   Brief/Interim Summary: HPI was taken from Dr. Chipper Herb: Dorothy Powers is a 78 y.o. female with medical history significant of PAF on Eliquis, HTN, HLD, hammertoe of right foot and severe OA of right foot presented with elective surgery of right foot deformity.  Patient has no complaints.  This morning, patient underwent elective surgery including metatarsal head excision 2-5 and hammertoe correction and fusion of joint of great toe and bone graft.  Patient tolerated procedure well.  After the procedure however during the recovery room stay, it was noticed patient started develop episode of in and out A-fib.  Patient has no complaints of chest pain or palpitations or shortness of breath.   Discharge Diagnoses:  Principal Problem:   Status post foot surgery Active Problems:   Osteoarthritis   Major osseous defect   Hammertoe of right foot   Metatarsalgia of right foot  PAF: continue on dose of amio, cardizem and will restart eliquis 10/04/23. Mg & K are both WNL    Right foot hammertoes: w/ severe osteoarthritis and deformity. S/p metatarsal head excision 2-5 and hammertoe correction and fusion of joint of great toe and bone graft. PT recs home health     HTN: continue on cardizem.  Chronic diastolic CHF: appears euvolemic. Monitor I/Os. Continue on home dose of lasix    Rosacea: continue on home dose of doxycycline   Obesity: BMI 35.6. Would benefit from weight loss   Discharge Instructions  Discharge  Instructions     Diet - low sodium heart healthy   Complete by: As directed    Discharge instructions   Complete by: As directed    F/u w/ podiatry, Dr. Lilian Kapur, within 1 week. PWB to heel with walker. No dressing change till follow up. F/u w/ PCP in 1-2 weeks   Increase activity slowly   Complete by: As directed       Allergies as of 10/04/2023       Reactions   Codeine Other (See Comments)   "Couldn't move one night for a short time, after taking the medicaiton"   Lovastatin Other (See Comments)   Leg pain   Sulfa Antibiotics Other (See Comments)   "Felt weak"        Medication List     TAKE these medications    acetaminophen 500 MG tablet Commonly known as: TYLENOL Take 2 tablets (1,000 mg total) by mouth every 6 (six) hours as needed for up to 14 days (pain).   albuterol 108 (90 Base) MCG/ACT inhaler Commonly known as: VENTOLIN HFA Inhale 1-2 puffs into the lungs every 6 (six) hours as needed for wheezing.   amiodarone 200 MG tablet Commonly known as: PACERONE Take 1 tablet by mouth once daily   Calcium-Vitamin D 600-3.125 MG-MCG Tabs Take 2 tablets by mouth at bedtime.   cetirizine 10 MG tablet Commonly known as: ZYRTEC Take 10 mg by mouth at bedtime.   CO Q 10 PO Take 200 mg by mouth in the morning.   diltiazem 300 MG 24 hr capsule Commonly known as:  TIAZAC Take 1 capsule by mouth once daily   diphenhydrAMINE 25 MG tablet Commonly known as: BENADRYL Take 25-50 mg by mouth as needed for allergies.   doxycycline 50 MG capsule Commonly known as: VIBRAMYCIN Take 50 mg by mouth in the morning. Rosacea   Eliquis 5 MG Tabs tablet Generic drug: apixaban Take 1 tablet by mouth twice daily   ezetimibe 10 MG tablet Commonly known as: ZETIA Take 10 mg by mouth every morning.   furosemide 40 MG tablet Commonly known as: LASIX Take 1 tablet by mouth once daily What changed: when to take this   ipratropium 0.03 % nasal spray Commonly known as:  ATROVENT Place 2 sprays into both nostrils 2 (two) times daily.   Klor-Con M20 20 MEQ tablet Generic drug: potassium chloride SA Take 1 tablet by mouth once daily   OSTEO BI-FLEX ADV TRIPLE ST PO Take 1 tablet by mouth in the morning and at bedtime.   oxyCODONE 5 MG immediate release tablet Commonly known as: Oxy IR/ROXICODONE Take 1 tablet (5 mg total) by mouth every 4 (four) hours as needed for up to 5 days for severe pain (pain score 7-10).   pantoprazole 40 MG tablet Commonly known as: PROTONIX Take 40 mg by mouth every morning.   rosuvastatin 10 MG tablet Commonly known as: CRESTOR Take 10 mg by mouth every morning.   SYSTANE OP Place 2 drops into both eyes 2 (two) times daily.   VITAMIN C PO Take 500 mg by mouth in the morning.   Vitamin D3 125 MCG (5000 UT) Tabs Take 5,000 Units by mouth daily.        Follow-up Information     Joycelyn Rua, MD Follow up.   Specialty: Family Medicine Why: F/u in 1-2 weeks Contact information: [address] [place] [address] [phone]         Edwin Cap, DPM Follow up.   Specialty: Podiatry Why: F/u within 1 week Contact information: [address] [place] [address] [phone]                Allergies  Allergen Reactions   Codeine Other (See Comments)    "Couldn't move one night for a short time, after taking the medicaiton"   Lovastatin Other (See Comments)    Leg pain   Sulfa Antibiotics Other (See Comments)    "Felt weak"    Consultations: Podiatry    Procedures/Studies: DG Foot Complete Right Result Date: 10/03/2023 CLINICAL DATA:  Postop. EXAM: RIGHT FOOT COMPLETE - 3+ VIEW COMPARISON:  Preoperative imaging FINDINGS: Fusion of the first tarsal metatarsal joint. Resection of the second through fifth metatarsal heads with K-wire fixation of the second through fifth rays. Expected postsurgical change in the soft tissues. IMPRESSION: Fusion of the first  tarsometatarsal joint. Resection of the second through fifth metatarsal heads with K wire fixation of the second through fifth rays. Electronically Signed   By: Narda Rutherford M.D.   On: 10/03/2023 14:50   DG MINI C-ARM IMAGE ONLY Result Date: 10/03/2023 There is no interpretation for this exam.  This order is for images obtained during a surgical procedure.  Please See "Surgeries" Tab for more information regarding the procedure.   [place] OR NERVE BLOCK-IMAGE ONLY [facility]) Result Date: 10/03/2023 There is no interpretation for this exam.  This order is for images obtained during a surgical procedure.  Please See "Surgeries" Tab for more information regarding the procedure.   ECHOCARDIOGRAM COMPLETE Result Date:  09/19/2023    ECHOCARDIOGRAM REPORT   Patient Name:   Dorothy Powers Date of Exam: 09/19/2023 Medical Rec #:  [id number]          Height:       62.0 in Accession #:    [id number]         Weight:       194.6 lb Date of Birth:  June 16, 1946          BSA:          1.890 m Patient Age:    78 years           BP:           140/64 mmHg Patient Gender: F                  HR:           69 bpm. Exam Location:  Outpatient Procedure: 2D Echo, Cardiac Doppler and Color Doppler (Both Spectral and Color            Flow Doppler were utilized during procedure). Indications:    Preoperative cardiovascular examination [Z01.810]  History:        Patient has prior history of Echocardiogram examinations. Risk                 Factors:Hypertension.  Sonographer:    Neysa Bonito Roar Referring Phys: [id number] IMPRESSIONS  1. Left ventricular ejection fraction, by estimation, is 60 to 65%. The left ventricle has normal function. The left ventricle has no regional wall motion abnormalities. Left ventricular diastolic parameters are consistent with Grade II diastolic dysfunction (pseudonormalization).  2. Right ventricular systolic function is normal. The right ventricular size is normal.  3. Left atrial size was mild to  moderately dilated.  4. Right atrial size was mildly dilated.  5. The mitral valve is normal in structure. Mild mitral valve regurgitation. No evidence of mitral stenosis.  6. The aortic valve is tricuspid. There is mild calcification of the aortic valve. Aortic valve regurgitation is not visualized. No aortic stenosis is present.  7. The inferior vena cava is normal in size with greater than 50% respiratory variability, suggesting right atrial pressure of 3 mmHg. Conclusion(s)/Recommendation(s): Pericardial effusion no longer present. FINDINGS  Left Ventricle: Left ventricular ejection fraction, by estimation, is 60 to 65%. The left ventricle has normal function. The left ventricle has no regional wall motion abnormalities. The left ventricular internal cavity size was normal in size. There is  no left ventricular hypertrophy. Left ventricular diastolic parameters are consistent with Grade II diastolic dysfunction (pseudonormalization). Right Ventricle: The right ventricular size is normal. No increase in right ventricular wall thickness. Right ventricular systolic function is normal. Left Atrium: Left atrial size was mild to moderately dilated. Right Atrium: Right atrial size was mildly dilated. Pericardium: There is no evidence of pericardial effusion. Mitral Valve: The mitral valve is normal in structure. Mild mitral valve regurgitation. No evidence of mitral valve stenosis. MV peak gradient, 8.1 mmHg. The mean mitral valve gradient is 3.0 mmHg. Tricuspid Valve: The tricuspid valve is normal in structure. Tricuspid valve regurgitation is mild . No evidence of tricuspid stenosis. Aortic Valve: The aortic valve is tricuspid. There is mild calcification of the aortic valve. Aortic valve regurgitation is not visualized. No aortic stenosis is present. Aortic valve mean gradient measures 5.0 mmHg. Aortic valve peak gradient measures 9.7 mmHg. Aortic valve area, by VTI measures 1.82 cm. Pulmonic Valve: The pulmonic  valve was  normal in structure. Pulmonic valve regurgitation is trivial. No evidence of pulmonic stenosis. Aorta: The aortic root is normal in size and structure. Venous: The inferior vena cava is normal in size with greater than 50% respiratory variability, suggesting right atrial pressure of 3 mmHg. IAS/Shunts: No atrial level shunt detected by color flow Doppler.  LEFT VENTRICLE PLAX 2D LVIDd:         4.20 cm   Diastology LVIDs:         2.80 cm   LV e' medial:    10.60 cm/s LV PW:         0.90 cm   LV E/e' medial:  13.0 LV IVS:        1.20 cm   LV e' lateral:   9.90 cm/s LVOT diam:     1.70 cm   LV E/e' lateral: 13.9 LV SV:         71 LV SV Index:   38 LVOT Area:     2.27 cm  RIGHT VENTRICLE RV Basal diam:  2.60 cm RV Mid diam:    2.40 cm RV S prime:     12.20 cm/s TAPSE (M-mode): 3.0 cm LEFT ATRIUM             Index        RIGHT ATRIUM           Index LA diam:        3.90 cm 2.06 cm/m   RA Area:     16.60 cm LA Vol (A2C):   67.7 ml 35.83 ml/m  RA Volume:   40.60 ml  21.49 ml/m LA Vol (A4C):   59.4 ml 31.44 ml/m LA Biplane Vol: 64.2 ml 33.98 ml/m  AORTIC VALVE                     PULMONIC VALVE AV Area (Vmax):    2.15 cm      PV Vmax:          1.17 m/s AV Area (Vmean):   1.88 cm      PV Peak grad:     5.5 mmHg AV Area (VTI):     1.82 cm      PR End Diast Vel: 4.33 msec AV Vmax:           156.00 cm/s   RVOT Peak grad:   3 mmHg AV Vmean:          110.000 cm/s AV VTI:            0.392 m AV Peak Grad:      9.7 mmHg AV Mean Grad:      5.0 mmHg LVOT Vmax:         148.00 cm/s LVOT Vmean:        91.000 cm/s LVOT VTI:          0.315 m LVOT/AV VTI ratio: 0.80  AORTA Ao Sinus diam: 2.60 cm Ao STJ diam:   2.3 cm Ao Asc diam:   3.00 cm MITRAL VALVE                TRICUSPID VALVE MV Area (PHT): 3.46 cm     TR Peak grad:   39.7 mmHg MV Area VTI:   1.74 cm     TR Vmax:        315.00 cm/s MV Peak grad:  8.1 mmHg MV Mean grad:  3.0 mmHg     SHUNTS MV Vmax:  1.42 m/s     Systemic VTI:  0.32 m MV Vmean:      77.3  cm/s    Systemic Diam: 1.70 cm MV Decel Time: 219 msec MV E velocity: 138.00 cm/s MV A velocity: 87.50 cm/s MV E/A ratio:  1.58 Arvilla Meres MD Electronically signed by Arvilla Meres MD Signature Date/Time: 09/19/2023/4:13:02 PM    Final    (Echo, Carotid, EGD, Colonoscopy, ERCP)    Subjective: Pt c/o fatigue    Discharge Exam: Vitals:   10/04/23 0420 10/04/23 0858  BP: (!) 123/57 122/81  Pulse: 62 66  Resp: 18 15  Temp: 98.4 F (36.9 C) 98.3 F (36.8 C)  SpO2: 97% 93%   Vitals:   10/03/23 2209 10/04/23 0153 10/04/23 0420 10/04/23 0858  BP: (!) 114/57 (!) 135/52 (!) 123/57 122/81  Pulse: 62 76 62 66  Resp: 18 14 18 15   Temp: 98.4 F (36.9 C) 98.6 F (37 C) 98.4 F (36.9 C) 98.3 F (36.8 C)  TempSrc:    Oral  SpO2: 94% 94% 97% 93%  Weight:      Height:        General: Pt is alert, awake, not in acute distress Cardiovascular: S1/S2 +, no rubs, no gallops Respiratory: CTA bilaterally, no wheezing, no rhonchi Abdominal: Soft, NT, obese, bowel sounds + Extremities: no cyanosis    The results of significant diagnostics from this hospitalization (including imaging, microbiology, ancillary and laboratory) are listed below for reference.     Microbiology: No results found for this or any previous visit (from the past 240 hours).   Labs: BNP (last 3 results) No results for input(s): "BNP" in the last 8760 hours. Basic Metabolic Panel: Recent Labs  Lab 10/03/23 1234  NA 139  K 4.1  CL 101  CO2 27  GLUCOSE 98  BUN 21  CREATININE 0.82  CALCIUM 8.8*  MG 2.3   Liver Function Tests: No results for input(s): "AST", "ALT", "ALKPHOS", "BILITOT", "PROT", "ALBUMIN" in the last 168 hours. No results for input(s): "LIPASE", "AMYLASE" in the last 168 hours. No results for input(s): "AMMONIA" in the last 168 hours. CBC: No results for input(s): "WBC", "NEUTROABS", "HGB", "HCT", "MCV", "PLT" in the last 168 hours. Cardiac Enzymes: No results for input(s):  "CKTOTAL", "CKMB", "CKMBINDEX", "TROPONINI" in the last 168 hours. BNP: Invalid input(s): "POCBNP" CBG: No results for input(s): "GLUCAP" in the last 168 hours. D-Dimer No results for input(s): "DDIMER" in the last 72 hours. Hgb A1c No results for input(s): "HGBA1C" in the last 72 hours. Lipid Profile No results for input(s): "CHOL", "HDL", "LDLCALC", "TRIG", "CHOLHDL", "LDLDIRECT" in the last 72 hours. Thyroid function studies No results for input(s): "TSH", "T4TOTAL", "T3FREE", "THYROIDAB" in the last 72 hours.  Invalid input(s): "FREET3" Anemia work up No results for input(s): "VITAMINB12", "FOLATE", "FERRITIN", "TIBC", "IRON", "RETICCTPCT" in the last 72 hours. Urinalysis    Component Value Date/Time   COLORURINE STRAW (A) 01/31/2022 0105   APPEARANCEUR CLEAR 01/31/2022 0105   LABSPEC 1.005 01/31/2022 0105   PHURINE 7.0 01/31/2022 0105   GLUCOSEU NEGATIVE 01/31/2022 0105   HGBUR NEGATIVE 01/31/2022 0105   BILIRUBINUR NEGATIVE 01/31/2022 0105   KETONESUR NEGATIVE 01/31/2022 0105   PROTEINUR NEGATIVE 01/31/2022 0105   NITRITE NEGATIVE 01/31/2022 0105   LEUKOCYTESUR MODERATE (A) 01/31/2022 0105   Sepsis Labs No results for input(s): "WBC" in the last 168 hours.  Invalid input(s): "PROCALCITONIN", "LACTICIDVEN" Microbiology No results found for this or any previous visit (from the past 240 hours).  Time coordinating discharge: Over 30 minutes  SIGNED:   Charise Killian, MD  [facility] 10/04/2023, 2:17 PM Pager   If 7PM-7AM, please contact night-coverage [email or web address]

## 2023-10-04 NOTE — TOC Transition Note (Signed)
 Transition of Care [facility]) - Discharge Note   Patient Details  Name: Dorothy Powers MRN: [id number] Date of Birth: 02-12-1946  Transition of [facility]) CM/SW Contact:  Bing Quarry, RN Phone Number: 10/04/2023, 2:23 PM   Clinical Narrative: 3/22: Patient has discharge orders in for today with home health PT/OT orders. Spoke with patient, offered choice, did not have preferences. [facility] accepted and this was acceptable to patient. Start of service most likely 10/06/23 per Elnita Maxwell at [facility]. Has PCP confirmed in EPIC. Spouse will transport home on discharge. No other concerns regarding discharge plan. Has DME WC/Walker/Knee Scooter at home.    Gabriel Cirri MSN RN CM  RN Case Manager [place]  Transitions of Care Direct Dial: [phone] (Weekends Only) [facility] Main Office Phone: [phone] [facility] Fax: [phone] [email or web address]     Final next level of care: Home w Home Health Services Barriers to Discharge: No Barriers Identified   Patient Goals and CMS Choice   CMS Medicare.gov Compare Post Acute Care list provided to:: Patient Choice offered to / list presented to : Patient      Discharge Placement                       Discharge Plan and Services Additional resources added to the After Visit Summary for                  DME Arranged: N/A DME Agency: NA       HH Arranged: PT, OT HH Agency: [facility] Date [facility] Agency Contacted: 10/04/23 Time HH Agency Contacted: 1423 Representative spoke with at [facility] Agency: Elnita Maxwell: Start of service Monday 10/06/23  Social Drivers of Health (SDOH) Interventions SDOH Screenings   Food Insecurity: No Food Insecurity (10/03/2023)  Housing: Low Risk  (10/03/2023)  Transportation Needs: No Transportation Needs (10/03/2023)  Utilities: Not At Risk (10/03/2023)  Social Connections: Socially Integrated (10/03/2023)  Tobacco Use: Medium Risk (10/03/2023)     Readmission Risk Interventions     No data to  display

## 2023-10-04 NOTE — Op Note (Addendum)
 Patient Name: Dorothy Powers DOB: [birth year]  MRN: [id number]   Date of Service: 10/03/2023  Surgeon: Dr. Sharl Ma, DPM Assistants: None Pre-operative Diagnosis:  Right foot hammertoe 2,3,4,5 Right foot metatarsal deformity 2, 3, 4, 5 Right foot osteoarthritis of first metatarsophalangeal joint  Right foot metatarsalgia Post-operative Diagnosis:  Right foot hammertoe 2,3,4,5 Right foot metatarsal deformity 2, 3, 4, 5 Right foot osteoarthritis of first metatarsophalangeal joint  Right foot metatarsalgia Procedures: First MTP arthrodesis Metatarsal head resections 2, 3, 4, 5 Hammertoe correction PIPJ arthrodesis 2, 3, 4, 5 Bone graft harvest and processing minor Pathology/Specimens: * No specimens in log * Anesthesia: General With regional block Hemostasis:  Total Tourniquet Time Documented: Calf (Right) - 7 minutes Calf (Right) - 100 minutes Total: Calf (Right) - 107 minutes  Estimated Blood Loss: 10 mL Materials:  Implant Name Type Inv. Item Serial No. Manufacturer Lot No. LRB No. Used Action  GUIDEWIRE 1.6 FLEX - [id number] Wire GUIDEWIRE 1.6 FLEX  [employer] [id number] Right 4 Implanted  SCREW VAL KREULOCK 3.0X12 TI - [id number] Screw SCREW VAL KREULOCK 3.0X12 TI  [employer]  Right 1 Implanted  SCREW LOCK COMP 3X16 - [id number] Screw SCREW LOCK COMP 3X16  [employer]  Right 2 Implanted  SCREW VAL KREULOCK 3.0X18 TI - [id number] Screw SCREW VAL KREULOCK 3.0X18 TI  [employer]  Right 2 Implanted  SCREW CORTICAL 3.0X16 - [id number] Screw SCREW CORTICAL 3.0X16  [employer]  Right 1 Implanted  SCREW COMPR FT QF 3.5X24 - [id number] Screw SCREW COMPR FT QF 3.5X24  [employer]  Right 1 Implanted  MTP plate    [employer]  Right 1 Implanted   Medications: None given Complications: No complications  Indications for Procedure:  This is a 78 y.o. female with a history of previous bunionectomy who developed severe osteoarthritis of the first metatarsophalangeal joint as well  as digital contractures and metatarsalgia with plantarflexed metatarsal heads.  After failing nonoperative treatment she elected for operative treatment. All risks, benefits and potential complications discussed prior to the procedure. All questions addressed. Informed consent signed and reviewed.     Procedure in Detail: Patient was identified in pre-operative holding area. Formal consent was signed and the right lower extremity was marked. Patient was brought back to the operating room. Anesthesia was induced. The extremity was prepped and draped in the usual sterile fashion. Timeout was taken to confirm patient name, laterality, and procedure prior to incision.   I began by making a small incision in the medial lower leg overlying the medial metaphysis of the tibia.  Blunt dissection was used to elevate soft tissues.  A trocar was used to enter the tibial canal and 40 cc of bone marrow aspirate was harvested this was concentrated and used in the later arthrodesis sites.  This incision was irrigated and closed with Vicryl and nylon.  Attention was then directed to the right foot where a longitudinal incision was made just medial to the extensor hallucis longus tendon.  This carried through subcutaneous tissues all bleeders were cauterized as necessary.  Neurovascular bundles and extensor tendon were safely retracted and dissection was carried down to the capsule of the first metatarsal phalangeal joint which was incised and a McGlamry elevator was used to expose the metatarsal head and base of the proximal phalanx.  There is a large amount of scar tissue and nonabsorbable suture in this area.  Soft tissue attachments were freed from the base of the phalanx.  Osteoarthritis of both surfaces  was noted.  A guidewire for the reamer was placed over the base of the proximal phalanx and the reamer was used by hand to resect the articular cartilage and some of the subchondral bone plate.  Due to the large amount  of bony cyst present on her x-rays I prepared the metatarsal head with a saw and rongeur.  The prominent medial eminence of the first metatarsal head was then resected using a sagittal saw.  All bony and soft tissue debris was then thoroughly irrigated from the wound.  A 2.0 mm drill bit was used to fenestrate the head of the first metatarsal and base of the proximal phalanx.   I then made separate incisions in the second and fourth interspace.  Dissection was carried deep through subcutaneous tissue cauterizing bleeding vessels as necessary.  Sharp and blunt dissection was used to expose the second metatarsal head laterally and the third metatarsal head medially through the first incision in the second interspace, as well as the fourth interspace incision used to expose the fourth metatarsal head laterally and the fifth metatarsal head medially.  The extensor sling of the extensor tendon was incised and the tendons retracted.  Dorsal capsulotomies were performed to expose the joint.  I McGlamery elevator was used to expose the metatarsal head.  A sagittal saw was used to resect each of the 4 metatarsal head sequentially.  The resection site was rasped smooth and irrigated with saline thoroughly.  I took the metatarsal heads and denuded them of all cartilage and soft tissue attachments and cortical bone and crushed them to serve as autogenous cancellous bone graft.  This bone graft was then taken and impregnated with the bone marrow concentrated aspirate.  It was placed into the fusion site between the first metatarsal and proximal phalanx.  The hallux was then placed into a corrected position using fluoroscopic guidance.  Once satisfactory position had been achieved in the transverse plane it was slightly dorsiflexed using a plate to simulate weightbearing and a folded 4 x 4 sponge.  A guidewire for the 3.5 millimeter screw was then placed.  I then directed my attention dorsally and selected a plate and put it  in temporary position using plate tacks.  Locking screws were placed in the distal holes with good stability of the construct.  Once satisfactory position was achieved locking and nonlocking screws were placed in the proximal screw holes, the non locking screw and compression device was used in the compression slot to achieve further compression.  The 3.5 mm crossing screw was then placed from the medial base of the proximal phalanx to the lateral head of the metatarsal.  Remaining bone graft was tamped into position around the fusion site.    Attention was then directed to the second third fourth and fifth digits where a transverse semielliptical incision was made over the PIPJ.  The extensor tendon was incised.  The PIPJ was exposed and the collateral ligaments were released.  The articular surface of the head of the proximal phalanx and the base of the middle phalanx was then resected using a sagittal saw and rongeur.  A 1.6 mm nitinol flex wire was then inserted through the base of the middle phalanx through the distal phalanx and out the tip of the toe just under the nail.  The specialty wires were selected over standard Kirschner wires due to the patient's age and functional habitus and limitations that she will need to be weightbearing quite early in a boot and  standard stainless steel wires will be at risk of bending or breakage.  This was then driven retrograde to the level of the base of the proximal phalanx.  The toe was then placed into position and the wire was driven across the metatarsal head resection space and into the metatarsal canal approximately.  Good compression of the PIPJ arthrodesis site was noted clinically and fluoroscopically with adequate reduction of the deformity.  Space was left for the metatarsal head resections to allow for eventual range of motion at this level.  Final films were taken and the incision was thoroughly irrigated, closure was initiated in layers using 3-0 Vicryl,  3-0 Monocryl and 3-0 nylon. The foot was then dressed with Xeroform ABD pad 4 x 4 gauze Kerlix and an Ace wrap under light compression. Patient tolerated the procedure well.   Disposition: Following a period of post-operative monitoring, patient will be transferred to the floor for a 24-hour observation admission to monitor her cardiac function.  Physical therapy is ordered for tomorrow morning she may be partial weightbearing to the heel utilizing assistive devices such as a walker and a tall cam walker boot which was placed on her in postop.  Postop films were taken in PACU.Marland Kitchen

## 2023-10-04 NOTE — Progress Notes (Signed)
 Physical Therapy Treatment Patient Details Name: Dorothy Powers MRN: [id number] DOB: 1945/09/26 Today's Date: 10/04/2023   History of Present Illness Per MD note in chart, Dorothy Powers is a 78 y.o. female with medical history significant of PAF on Eliquis, HTN, HLD, hammertoe of right foot and severe OA of right foot presented with elective surgery of right foot deformity.  This morning, patient underwent elective surgery including metatarsal head excision 2-5 and hammertoe correction and fusion of joint of great toe and bone graft. Patient is Partial WB (to heel in boot w/ walker) RLE    PT Comments  Pt continues to improve in mobility. She is able to maintain PWB in RLE with heel in boot with RW for transfers, taking steps and stair negotiation. Pt re-educated in stair negotiation; She was able to progress to negotiate #2 steps with RW with min A +1. Pt verbalized and demonstrated understanding of sequencing and WB precautions. She does require min A +1 for transfers including pivot transfers. Pt would benefit from additional skilled PT intervention to improve strength and mobility.     If plan is discharge home, recommend the following: A little help with walking and/or transfers;A little help with bathing/dressing/bathroom;Assistance with cooking/housework;Assist for transportation;Help with stairs or ramp for entrance   Can travel by private vehicle        Equipment Recommendations  None recommended by PT    Recommendations for Other Services       Precautions / Restrictions Precautions Precautions: Fall Required Braces or Orthoses: Other Brace Other Brace: Boot on RLE Restrictions Weight Bearing Restrictions Per Provider Order: Yes RLE Weight Bearing Per Provider Order: Partial weight bearing Other Position/Activity Restrictions: Partial WB (to heel in boot w/ walker) RLE     Mobility  Bed Mobility Overal bed mobility: Modified Independent             General  bed mobility comments: uses bed rails/elevated head of bed; requires extra time but able to exhibit good positioning    Transfers Overall transfer level: Needs assistance Equipment used: Rolling walker (2 wheels) Transfers: Sit to/from Stand Sit to Stand: Min assist           General transfer comment: x2 reps from recliner, x1 rep from bed; required min VCs for hand placement and positioning; able to maintain WB precautions well    Ambulation/Gait Ambulation/Gait assistance: Min assist, Contact guard assist Gait Distance (Feet): 5 Feet Assistive device: Rolling walker (2 wheels) Gait Pattern/deviations: Step-to pattern, Decreased stance time - right, Trunk flexed Gait velocity: decreased     General Gait Details: pt able to maintain heel partial weight bearing on RLE while wearing boot. Pt stepped from recliner to stairs for negotiation;   Stairs Stairs: Yes Stairs assistance: Min assist Stair Management: No rails, Step to pattern, Backwards Number of Stairs: 2 General stair comments: Pt re-educated in stair negotiation safety. she ascended backwards and descending forward, #2 steps x2 reps with cues for sequencing and walker placement. Pt able to negotiate steps with min A +1 with walker assistance, maintaining PWB in RLE with heel in boot.   Wheelchair Mobility     Tilt Bed    Modified Rankin (Stroke Patients Only)       Balance Overall balance assessment: Needs assistance Sitting-balance support: Feet supported Sitting balance-Leahy Scale: Good     Standing balance support: Bilateral upper extremity supported, Reliant on assistive device for balance Standing balance-Leahy Scale: Fair  Communication Communication Communication: No apparent difficulties  Cognition Arousal: Alert Behavior During Therapy: WFL for tasks assessed/performed   PT - Cognitive impairments: No apparent impairments                          Following commands: Intact      Cueing Cueing Techniques: Verbal cues  Exercises Other Exercises Other Exercises: Pt required assistance for changing brief and re-applying purewick at end of session, standing with RW support, mod A to pull up new brief due to incontinence. (RN notified) Other Exercises: Instructed patient in LLE strengthening exercise including ankle pump x5 reps, SLR x5 reps; also educated patient to do posterior shoulder rolls and stretch BUE to help reduce soreness related to increased weight bearing in BUE. (x8 min)    General Comments General comments (skin integrity, edema, etc.): Pt wearing boot on RLE during evaluation; does have external fixation on distal toes      Pertinent Vitals/Pain Pain Assessment Pain Assessment: No/denies pain    Home Living Family/patient expects to be discharged to:: Private residence Living Arrangements: Spouse/significant other Available Help at Discharge: Family;Available 24 hours/day Type of Home: House Home Access: Stairs to enter Entrance Stairs-Rails: None Entrance Stairs-Number of Steps: 2   Home Layout: Other (Comment) (has 2 steps with right rail in den) Home Equipment: Agricultural consultant (2 wheels);Shower seat - built in;BSC/3in1 Additional Comments: Did not use any equipment prior to admittance; also has knee scooter    Prior Function            PT Goals (current goals can now be found in the care plan section) Acute Rehab PT Goals Patient Stated Goal: to go home PT Goal Formulation: With patient Time For Goal Achievement: 10/18/23 Potential to Achieve Goals: Good Progress towards PT goals: Progressing toward goals    Frequency    BID      PT Plan      Co-evaluation              AM-PAC PT "6 Clicks" Mobility   Outcome Measure  Help needed turning from your back to your side while in a flat bed without using bedrails?: A Little Help needed moving from lying on your back to sitting on  the side of a flat bed without using bedrails?: A Little Help needed moving to and from a bed to a chair (including a wheelchair)?: A Little Help needed standing up from a chair using your arms (e.g., wheelchair or bedside chair)?: A Little Help needed to walk in hospital room?: A Little Help needed climbing 3-5 steps with a railing? : A Lot 6 Click Score: 17    End of Session Equipment Utilized During Treatment: Gait belt Activity Tolerance: Patient tolerated treatment well Patient left: with call bell/phone within reach;in bed;with bed alarm set Nurse Communication: Mobility status PT Visit Diagnosis: Other abnormalities of gait and mobility (R26.89);Muscle weakness (generalized) (M62.81)     Time: 7829-5621 PT Time Calculation (min) (ACUTE ONLY): 49 min  Charges:    $Gait Training: 23-37 mins $Therapeutic Exercise: 8-22 mins $Therapeutic Activity: 8-22 mins PT General Charges $$ ACUTE PT VISIT: 1 Visit                      Mickel Schreur PT, DPT 10/04/2023, 3:09 PM

## 2023-10-04 NOTE — Progress Notes (Signed)
  Subjective:  Patient ID: Dorothy Powers, female    DOB: 1945-09-02,  MRN: [id number]  A 78 y.o. female presents Right foot hammer and first MpJ arthritis now s/p pod #1 First MTP arthrodesis with pan met head resections with hammertoe correction. Patient is doing well. Pain controlled. Worked with PT. No acute complaints   Objective:   Vitals:   10/04/23 0420 10/04/23 0858  BP: (!) 123/57 122/81  Pulse: 62 66  Resp: 18 15  Temp: 98.4 F (36.9 C) 98.3 F (36.8 C)  SpO2: 97% 93%   General AA&O x3. Normal mood and affect.  Vascular  Brisk capillary refill to all digits. Rest Deffered due to dressing  Neurologic Deffered due to dressing  Dermatologic Bandage clean dry intact. No calf pain. Motor sensory function intatct. Pins intact   Orthopedic: Deffered due to dressing    Assessment & Plan:  Patient was evaluated and treated and all questions answered.  Right foot metatarsalagia now s/p Right first MTP fusion with pan met head resection with hammertoe repair -All questions and concerns were discussed with the patients. -Patient is okay to be discharged from my standpoint. -Awaiting PT eval -Pain medication already sent to pharmacy -PWB to heel with walker- -No dressing change till follow up. F/u already scheduled   Candelaria Stagers, DPM  Accessible via secure chat for questions or concerns.

## 2023-10-04 NOTE — Evaluation (Signed)
 Physical Therapy Evaluation Patient Details Name: Dorothy Powers MRN: [id number] DOB: [birth year] Today's Date: 10/04/2023  History of Present Illness  Per MD note in chart, Dorothy Powers is a 78 y.o. female with medical history significant of PAF on Eliquis, HTN, HLD, hammertoe of right foot and severe OA of right foot presented with elective surgery of right foot deformity.  This morning, patient underwent elective surgery including metatarsal head excision 2-5 and hammertoe correction and fusion of joint of great toe and bone graft. Patient is Partial WB (to heel in boot w/ walker) RLE  Clinical Impression  78yo Female recently admitted after undergoing surgery to right foot for deformity correction. Patient is Partial WB to heel in boot with walker to RLE per MD order; She lives at home with her husband and has a supportive family. She does have various steps to enter home (most entrances have at least 2) without railings. Patient is mod I for bed mobility, requiring extra time/effort and bedrails. She transfers sit<>Stand with RW, min A able to maintain PWB to RLE with heel weight bearing. Patient reported some soreness during transfers but minimal. She was able to take a few steps with RW, min A for safety while maintaining weight bearing precautions. Patient ambulates with short stance RLE, with heavy guarding and leaning through BUE. She was educated this session in stair negotiation requiring increased cues/repeated instruction on sequencing to maintain WB precautions. She was also instructed in LLE strengthening and BUE stretch/ROM to help improve mobility. Patient would benefit from additional skilled PT Intervention to improve strength and mobility. Would benefit from additional education on stair negotiation in order to safely enter/exit home.         If plan is discharge home, recommend the following: A little help with walking and/or transfers;A little help with  bathing/dressing/bathroom;Assistance with cooking/housework;Assist for transportation;Help with stairs or ramp for entrance   Can travel by private vehicle        Equipment Recommendations None recommended by PT  Recommendations for Other Services       Functional Status Assessment Patient has had a recent decline in their functional status and demonstrates the ability to make significant improvements in function in a reasonable and predictable amount of time.     Precautions / Restrictions Precautions Precautions: Fall Required Braces or Orthoses: Other Brace Other Brace: Boot on RLE Restrictions Weight Bearing Restrictions Per Provider Order: Yes RLE Weight Bearing Per Provider Order: Partial weight bearing Other Position/Activity Restrictions: Partial WB (to heel in boot w/ walker) RLE      Mobility  Bed Mobility Overal bed mobility: Modified Independent             General bed mobility comments: uses bed rails/elevated head of bed; requires extra time but able to exhibit good positioning    Transfers Overall transfer level: Needs assistance Equipment used: Rolling walker (2 wheels) Transfers: Sit to/from Stand Sit to Stand: Min assist           General transfer comment: x3 reps with cues for hand placement and positioning; Able to maintain WB precautions in RLE well.    Ambulation/Gait Ambulation/Gait assistance: Min assist, Contact guard assist Gait Distance (Feet): 5 Feet Assistive device: Rolling walker (2 wheels) Gait Pattern/deviations: Step-to pattern, Decreased stance time - right, Trunk flexed Gait velocity: decreased     General Gait Details: pt able to maintain heel partial weight bearing on RLE while wearing boot.  Stairs Stairs: Yes Stairs assistance: Min assist, +  2 physical assistance Stair Management: No rails Number of Stairs: 2 General stair comments: pt educated in how to negotiate steps backward using RW as she does not have rails  at home; Educated on sequencing to maintain PWB in RLE (lead with strong leg ascending, lead with weaker leg when descending). Pt able to negotiate steps with heavy cues and extra time/effort. (X15 min)  Wheelchair Mobility     Tilt Bed    Modified Rankin (Stroke Patients Only)       Balance Overall balance assessment: Needs assistance Sitting-balance support: Feet supported Sitting balance-Leahy Scale: Good     Standing balance support: Bilateral upper extremity supported, Reliant on assistive device for balance Standing balance-Leahy Scale: Fair                               Pertinent Vitals/Pain Pain Assessment Pain Assessment: No/denies pain    Home Living Family/patient expects to be discharged to:: Private residence Living Arrangements: Spouse/significant other Available Help at Discharge: Family;Available 24 hours/day Type of Home: House Home Access: Stairs to enter Entrance Stairs-Rails: None Entrance Stairs-Number of Steps: 2   Home Layout: Other (Comment) (has 2 steps with right rail in den) Home Equipment: Agricultural consultant (2 wheels);Shower seat - built in;BSC/3in1 Additional Comments: Did not use any equipment prior to admittance; also has knee scooter    Prior Function Prior Level of Function : Independent/Modified Independent             Mobility Comments: still driving, ambulated without assistive device; reports 1 fall a week ago, tripping when negotiating curb, otherwise no recent falls ADLs Comments: independent in self care ADLs     Extremity/Trunk Assessment   Upper Extremity Assessment Upper Extremity Assessment: Overall WFL for tasks assessed    Lower Extremity Assessment Lower Extremity Assessment: RLE deficits/detail;LLE deficits/detail RLE Deficits / Details: not formally assessed; able to complete SLR RLE: Unable to fully assess due to immobilization LLE Deficits / Details: strength is WFL LLE Sensation: WNL LLE  Coordination: WNL    Cervical / Trunk Assessment Cervical / Trunk Assessment: Normal  Communication   Communication Communication: No apparent difficulties    Cognition Arousal: Alert Behavior During Therapy: WFL for tasks assessed/performed   PT - Cognitive impairments: No apparent impairments                         Following commands: Intact       Cueing Cueing Techniques: Verbal cues     General Comments General comments (skin integrity, edema, etc.): Pt wearing boot on RLE during evaluation; does have external fixation on distal toes    Exercises Other Exercises Other Exercises: Educated patient in role of PT and recommendations Other Exercises: Instructed patient in LLE strengthening exercise including ankle pump x5 reps, SLR x5 reps; also educated patient to do posterior shoulder rolls and stretch BUE to help reduce soreness related to increased weight bearing in BUE. (x10 min)   Assessment/Plan    PT Assessment Patient needs continued PT services  PT Problem List Decreased strength;Decreased balance;Decreased mobility;Decreased knowledge of use of DME       PT Treatment Interventions DME instruction;Balance training;Gait training;Stair training;Functional mobility training;Therapeutic activities;Therapeutic exercise;Patient/family education    PT Goals (Current goals can be found in the Care Plan section)  Acute Rehab PT Goals Patient Stated Goal: to go home PT Goal Formulation: With patient Time For Goal Achievement:  10/18/23 Potential to Achieve Goals: Good    Frequency BID     Co-evaluation               AM-PAC PT "6 Clicks" Mobility  Outcome Measure Help needed turning from your back to your side while in a flat bed without using bedrails?: A Little Help needed moving from lying on your back to sitting on the side of a flat bed without using bedrails?: A Little Help needed moving to and from a bed to a chair (including a  wheelchair)?: A Little Help needed standing up from a chair using your arms (e.g., wheelchair or bedside chair)?: A Little Help needed to walk in hospital room?: A Little Help needed climbing 3-5 steps with a railing? : A Lot 6 Click Score: 17    End of Session Equipment Utilized During Treatment: Gait belt Activity Tolerance: Patient tolerated treatment well Patient left: in chair;with call bell/phone within reach;with chair alarm set Nurse Communication: Mobility status PT Visit Diagnosis: Other abnormalities of gait and mobility (R26.89);Muscle weakness (generalized) (M62.81)    Time: 1610-9604 PT Time Calculation (min) (ACUTE ONLY): 66 min   Charges:   PT Evaluation $PT Eval Low Complexity: 1 Low PT Treatments $Gait Training: 8-22 mins $Therapeutic Exercise: 8-22 mins PT General Charges $$ ACUTE PT VISIT: 1 Visit          Lynessa Almanzar PT, DPT 10/04/2023, 2:46 PM

## 2023-10-06 ENCOUNTER — Encounter: Payer: Self-pay | Admitting: Podiatry

## 2023-10-08 DIAGNOSIS — E785 Hyperlipidemia, unspecified: Secondary | ICD-10-CM | POA: Diagnosis not present

## 2023-10-08 DIAGNOSIS — Z6835 Body mass index (BMI) 35.0-35.9, adult: Secondary | ICD-10-CM | POA: Diagnosis not present

## 2023-10-08 DIAGNOSIS — Z7901 Long term (current) use of anticoagulants: Secondary | ICD-10-CM | POA: Diagnosis not present

## 2023-10-08 DIAGNOSIS — Z792 Long term (current) use of antibiotics: Secondary | ICD-10-CM | POA: Diagnosis not present

## 2023-10-08 DIAGNOSIS — M19071 Primary osteoarthritis, right ankle and foot: Secondary | ICD-10-CM | POA: Diagnosis not present

## 2023-10-08 DIAGNOSIS — I480 Paroxysmal atrial fibrillation: Secondary | ICD-10-CM | POA: Diagnosis not present

## 2023-10-08 DIAGNOSIS — I110 Hypertensive heart disease with heart failure: Secondary | ICD-10-CM | POA: Diagnosis not present

## 2023-10-08 DIAGNOSIS — I5032 Chronic diastolic (congestive) heart failure: Secondary | ICD-10-CM | POA: Diagnosis not present

## 2023-10-08 DIAGNOSIS — L719 Rosacea, unspecified: Secondary | ICD-10-CM | POA: Diagnosis not present

## 2023-10-08 DIAGNOSIS — E669 Obesity, unspecified: Secondary | ICD-10-CM | POA: Diagnosis not present

## 2023-10-08 DIAGNOSIS — Z981 Arthrodesis status: Secondary | ICD-10-CM | POA: Diagnosis not present

## 2023-10-08 DIAGNOSIS — Z4789 Encounter for other orthopedic aftercare: Secondary | ICD-10-CM | POA: Diagnosis not present

## 2023-10-08 DIAGNOSIS — Z9181 History of falling: Secondary | ICD-10-CM | POA: Diagnosis not present

## 2023-10-09 ENCOUNTER — Ambulatory Visit: Payer: Medicare Other | Admitting: Podiatry

## 2023-10-09 ENCOUNTER — Ambulatory Visit (INDEPENDENT_AMBULATORY_CARE_PROVIDER_SITE_OTHER)

## 2023-10-09 DIAGNOSIS — M2011 Hallux valgus (acquired), right foot: Secondary | ICD-10-CM

## 2023-10-09 DIAGNOSIS — Z9889 Other specified postprocedural states: Secondary | ICD-10-CM

## 2023-10-09 NOTE — Progress Notes (Signed)
 Subjective:  Patient ID: Dorothy Powers, female    DOB: 1945/08/25,  MRN: [id number]  No chief complaint on file.   DOS: 10/03/2023 Procedure: Status post rheumatoid foot reconstruction  78 y.o. female returns for post-op check.  She states she is doing okay pain is manage she is known to Dr. Lilian Kapur.  Denies any other acute complaint no nausea fever chills vomiting.  Review of Systems: Negative except as noted in the HPI. Denies N/V/F/Ch.  Past Medical History:  Diagnosis Date   (HFpEF) heart failure with preserved ejection fraction (HCC)    Aortic atherosclerosis (HCC)    CAP (community acquired pneumonia)    GERD (gastroesophageal reflux disease)    Hammertoe of right foot    Heart murmur    History of colonic polyps    Hyperlipidemia    Hypertension    Hypokalemia    Long term current use of amiodarone    Meningioma (HCC)    Morbid obesity (HCC)    OA (osteoarthritis)    On apixaban therapy    Osteoarthritis    PAF (paroxysmal atrial fibrillation) (HCC)    a.) CHA2DS2VASc = 6 (age x 2, sex, HFpEF, HTN, vascular disease) as of 09/30/2023; b.) s/p DCCV  02/01/2022 --> 150 J x 1 --> NSR with PACs; c. rate/rhythm maintained on oral amiodarone + diltiazem; chronically anticoagulated with apixaban   RBBB (right bundle branch block)    Rosacea    Tachycardia    TIA (transient ischemic attack) 2021    Current Outpatient Medications:    albuterol (VENTOLIN HFA) 108 (90 Base) MCG/ACT inhaler, Inhale 1-2 puffs into the lungs every 6 (six) hours as needed for wheezing. (Patient not taking: Reported on 10/03/2023), Disp: , Rfl:    amiodarone (PACERONE) 200 MG tablet, Take 1 tablet by mouth once daily, Disp: 90 tablet, Rfl: 2   apixaban (ELIQUIS) 5 MG TABS tablet, Take 1 tablet by mouth twice daily, Disp: 180 tablet, Rfl: 2   Ascorbic Acid (VITAMIN C PO), Take 500 mg by mouth in the morning., Disp: , Rfl:    Calcium Carbonate-Vitamin D (CALCIUM-VITAMIN D) 600-3.125 MG-MCG TABS,  Take 2 tablets by mouth at bedtime., Disp: , Rfl:    cetirizine (ZYRTEC) 10 MG tablet, Take 10 mg by mouth at bedtime., Disp: , Rfl:    Cholecalciferol (VITAMIN D3) 125 MCG (5000 UT) TABS, Take 5,000 Units by mouth daily., Disp: , Rfl:    Coenzyme Q10 (CO Q 10 PO), Take 200 mg by mouth in the morning., Disp: , Rfl:    diltiazem (TIAZAC) 300 MG 24 hr capsule, Take 1 capsule by mouth once daily, Disp: 90 capsule, Rfl: 2   diphenhydrAMINE (BENADRYL) 25 MG tablet, Take 25-50 mg by mouth as needed for allergies., Disp: , Rfl:    doxycycline (VIBRAMYCIN) 50 MG capsule, Take 50 mg by mouth in the morning. Rosacea, Disp: , Rfl:    ezetimibe (ZETIA) 10 MG tablet, Take 10 mg by mouth every morning., Disp: , Rfl:    furosemide (LASIX) 40 MG tablet, Take 1 tablet by mouth once daily (Patient taking differently: Take 40 mg by mouth at bedtime.), Disp: 90 tablet, Rfl: 2   ipratropium (ATROVENT) 0.03 % nasal spray, Place 2 sprays into both nostrils 2 (two) times daily., Disp: , Rfl:    Misc Natural Products (OSTEO BI-FLEX ADV TRIPLE ST PO), Take 1 tablet by mouth in the morning and at bedtime., Disp: , Rfl:    pantoprazole (PROTONIX) 40 MG tablet,  Take 40 mg by mouth every morning., Disp: , Rfl:    Polyethyl Glycol-Propyl Glycol (SYSTANE OP), Place 2 drops into both eyes 2 (two) times daily., Disp: , Rfl:    potassium chloride SA (KLOR-CON M20) 20 MEQ tablet, Take 1 tablet by mouth once daily, Disp: 90 tablet, Rfl: 1   rosuvastatin (CRESTOR) 10 MG tablet, Take 10 mg by mouth every morning., Disp: , Rfl:   Social History   Tobacco Use  Smoking Status Former   Types: Cigarettes  Smokeless Tobacco Never    Allergies  Allergen Reactions   Codeine Other (See Comments)    "Couldn't move one night for a short time, after taking the medicaiton"   Lovastatin Other (See Comments)    Leg pain   Sulfa Antibiotics Other (See Comments)    "Felt weak"   Objective:  There were no vitals filed for this  visit. There is no height or weight on file to calculate BMI. Constitutional Well developed. Well nourished.  Vascular Foot warm and well perfused. Capillary refill normal to all digits.   Neurologic Normal speech. Oriented to person, place, and time. Epicritic sensation to light touch grossly present bilaterally.  Dermatologic Skin healing well without signs of infection. Skin edges well coapted without signs of infection.  Orthopedic: Tenderness to palpation noted about the surgical site.   Radiographs: 3 views of skeletally mature right foot: Hardware is intact no signs of backing or loosening noted.  Good correction alignment noted.  No other abnormalities identified Assessment:   1. Acquired hallux valgus of right foot   2. Status post foot surgery    Plan:  Patient was evaluated and treated and all questions answered.  S/p foot surgery right -Progressing as expected post-operatively. -XR: See above -WB Status: Nonweightbearing in right lower extremity -Sutures: Intact.  No clinical signs of Deis is noted no complication noted. -Medications: None -Foot redressed.  No follow-ups on file.   Status post 10/03/2023 rheumatoid foot reconstruction right side healing well

## 2023-10-13 DIAGNOSIS — I480 Paroxysmal atrial fibrillation: Secondary | ICD-10-CM | POA: Diagnosis not present

## 2023-10-13 DIAGNOSIS — M19071 Primary osteoarthritis, right ankle and foot: Secondary | ICD-10-CM | POA: Diagnosis not present

## 2023-10-13 DIAGNOSIS — I110 Hypertensive heart disease with heart failure: Secondary | ICD-10-CM | POA: Diagnosis not present

## 2023-10-13 DIAGNOSIS — I5032 Chronic diastolic (congestive) heart failure: Secondary | ICD-10-CM | POA: Diagnosis not present

## 2023-10-13 DIAGNOSIS — Z4789 Encounter for other orthopedic aftercare: Secondary | ICD-10-CM | POA: Diagnosis not present

## 2023-10-13 DIAGNOSIS — E785 Hyperlipidemia, unspecified: Secondary | ICD-10-CM | POA: Diagnosis not present

## 2023-10-15 DIAGNOSIS — I110 Hypertensive heart disease with heart failure: Secondary | ICD-10-CM | POA: Diagnosis not present

## 2023-10-15 DIAGNOSIS — M19071 Primary osteoarthritis, right ankle and foot: Secondary | ICD-10-CM | POA: Diagnosis not present

## 2023-10-15 DIAGNOSIS — I5032 Chronic diastolic (congestive) heart failure: Secondary | ICD-10-CM | POA: Diagnosis not present

## 2023-10-15 DIAGNOSIS — E785 Hyperlipidemia, unspecified: Secondary | ICD-10-CM | POA: Diagnosis not present

## 2023-10-15 DIAGNOSIS — I480 Paroxysmal atrial fibrillation: Secondary | ICD-10-CM | POA: Diagnosis not present

## 2023-10-15 DIAGNOSIS — Z4789 Encounter for other orthopedic aftercare: Secondary | ICD-10-CM | POA: Diagnosis not present

## 2023-10-22 ENCOUNTER — Ambulatory Visit (INDEPENDENT_AMBULATORY_CARE_PROVIDER_SITE_OTHER): Payer: Medicare Other | Admitting: Podiatry

## 2023-10-22 ENCOUNTER — Encounter: Payer: Self-pay | Admitting: Podiatry

## 2023-10-22 DIAGNOSIS — M2011 Hallux valgus (acquired), right foot: Secondary | ICD-10-CM

## 2023-10-23 DIAGNOSIS — Z4789 Encounter for other orthopedic aftercare: Secondary | ICD-10-CM | POA: Diagnosis not present

## 2023-10-23 DIAGNOSIS — I110 Hypertensive heart disease with heart failure: Secondary | ICD-10-CM | POA: Diagnosis not present

## 2023-10-23 DIAGNOSIS — I5032 Chronic diastolic (congestive) heart failure: Secondary | ICD-10-CM | POA: Diagnosis not present

## 2023-10-23 DIAGNOSIS — E785 Hyperlipidemia, unspecified: Secondary | ICD-10-CM | POA: Diagnosis not present

## 2023-10-23 DIAGNOSIS — I480 Paroxysmal atrial fibrillation: Secondary | ICD-10-CM | POA: Diagnosis not present

## 2023-10-23 DIAGNOSIS — M19071 Primary osteoarthritis, right ankle and foot: Secondary | ICD-10-CM | POA: Diagnosis not present

## 2023-10-23 NOTE — Progress Notes (Signed)
  Subjective:  Patient ID: Dorothy Powers, female    DOB: 1945-07-30,  MRN: [id number]  Chief Complaint  Patient presents with   Routine Post Op    POV # 2 DOS 10/03/23 --- FUSION OF RIGHT GREAT TOE JOINTS, HAMMERTOE CORRECTION 2-5, METATARSAL HEAD 2-5, BONE GRAFT FROM HEEL OR CADAVER, BONE MARROW ASPIRATION FROM LEG  "It's doing okay, I guess.  My pain level is about a three or four right now.  I haven't taken any of that pain medicine that he prescribed.  I just been taking Tylenol."     78 y.o. female returns for post-op check.   Review of Systems: Negative except as noted in the HPI. Denies N/V/F/Ch.   Objective:  There were no vitals filed for this visit. There is no height or weight on file to calculate BMI. Constitutional Well developed. Well nourished.  Vascular Foot warm and well perfused. Capillary refill normal to all digits.  Calf is soft and supple, no posterior calf or knee pain, negative Homans' sign  Neurologic Normal speech. Oriented to person, place, and time. Epicritic sensation to light touch grossly present bilaterally.  Dermatologic Skin healing well without signs of infection. Skin edges well coapted without signs of infection.  Orthopedic: Tenderness to palpation noted about the surgical site.   Multiple view plain film radiographs: Good correction noted all hardware intact and in position Assessment:   1. Acquired hallux valgus of right foot    Plan:  Patient was evaluated and treated and all questions answered.  S/p foot surgery right -Progressing as expected post-operatively.  Sutures removed uneventfully.  Continue partial weightbearing to heel primarily.  May wash foot at this point.  Apply Neosporin to pin sites before and after bathing.  Return in 3 weeks for new radiographs. -  No follow-ups on file.

## 2023-10-31 DIAGNOSIS — I5032 Chronic diastolic (congestive) heart failure: Secondary | ICD-10-CM | POA: Diagnosis not present

## 2023-10-31 DIAGNOSIS — E785 Hyperlipidemia, unspecified: Secondary | ICD-10-CM | POA: Diagnosis not present

## 2023-10-31 DIAGNOSIS — Z4789 Encounter for other orthopedic aftercare: Secondary | ICD-10-CM | POA: Diagnosis not present

## 2023-10-31 DIAGNOSIS — M19071 Primary osteoarthritis, right ankle and foot: Secondary | ICD-10-CM | POA: Diagnosis not present

## 2023-10-31 DIAGNOSIS — I110 Hypertensive heart disease with heart failure: Secondary | ICD-10-CM | POA: Diagnosis not present

## 2023-10-31 DIAGNOSIS — I480 Paroxysmal atrial fibrillation: Secondary | ICD-10-CM | POA: Diagnosis not present

## 2023-11-03 ENCOUNTER — Telehealth: Payer: Self-pay | Admitting: Podiatry

## 2023-11-03 DIAGNOSIS — E785 Hyperlipidemia, unspecified: Secondary | ICD-10-CM | POA: Diagnosis not present

## 2023-11-03 DIAGNOSIS — M19071 Primary osteoarthritis, right ankle and foot: Secondary | ICD-10-CM | POA: Diagnosis not present

## 2023-11-03 DIAGNOSIS — I480 Paroxysmal atrial fibrillation: Secondary | ICD-10-CM | POA: Diagnosis not present

## 2023-11-03 DIAGNOSIS — I5032 Chronic diastolic (congestive) heart failure: Secondary | ICD-10-CM | POA: Diagnosis not present

## 2023-11-03 DIAGNOSIS — Z4789 Encounter for other orthopedic aftercare: Secondary | ICD-10-CM | POA: Diagnosis not present

## 2023-11-03 DIAGNOSIS — I110 Hypertensive heart disease with heart failure: Secondary | ICD-10-CM | POA: Diagnosis not present

## 2023-11-03 NOTE — Telephone Encounter (Signed)
 Raj-from Physical Therapy went to this pts house and he has a question. Pt had an ace wrap on post op site. Dorothy Powers states it came off so he helped her put it back on. He wants to know does she still need the ace wrap at this point. Raj's phone number is [phone].

## 2023-11-05 DIAGNOSIS — D6869 Other thrombophilia: Secondary | ICD-10-CM | POA: Diagnosis not present

## 2023-11-05 DIAGNOSIS — I10 Essential (primary) hypertension: Secondary | ICD-10-CM | POA: Diagnosis not present

## 2023-11-05 DIAGNOSIS — E7800 Pure hypercholesterolemia, unspecified: Secondary | ICD-10-CM | POA: Diagnosis not present

## 2023-11-05 DIAGNOSIS — K219 Gastro-esophageal reflux disease without esophagitis: Secondary | ICD-10-CM | POA: Diagnosis not present

## 2023-11-05 DIAGNOSIS — I4891 Unspecified atrial fibrillation: Secondary | ICD-10-CM | POA: Diagnosis not present

## 2023-11-05 DIAGNOSIS — I5032 Chronic diastolic (congestive) heart failure: Secondary | ICD-10-CM | POA: Diagnosis not present

## 2023-11-07 DIAGNOSIS — Z981 Arthrodesis status: Secondary | ICD-10-CM | POA: Diagnosis not present

## 2023-11-07 DIAGNOSIS — E669 Obesity, unspecified: Secondary | ICD-10-CM | POA: Diagnosis not present

## 2023-11-07 DIAGNOSIS — Z4789 Encounter for other orthopedic aftercare: Secondary | ICD-10-CM | POA: Diagnosis not present

## 2023-11-07 DIAGNOSIS — L719 Rosacea, unspecified: Secondary | ICD-10-CM | POA: Diagnosis not present

## 2023-11-07 DIAGNOSIS — Z9181 History of falling: Secondary | ICD-10-CM | POA: Diagnosis not present

## 2023-11-07 DIAGNOSIS — Z792 Long term (current) use of antibiotics: Secondary | ICD-10-CM | POA: Diagnosis not present

## 2023-11-07 DIAGNOSIS — M19071 Primary osteoarthritis, right ankle and foot: Secondary | ICD-10-CM | POA: Diagnosis not present

## 2023-11-07 DIAGNOSIS — I5032 Chronic diastolic (congestive) heart failure: Secondary | ICD-10-CM | POA: Diagnosis not present

## 2023-11-07 DIAGNOSIS — Z6835 Body mass index (BMI) 35.0-35.9, adult: Secondary | ICD-10-CM | POA: Diagnosis not present

## 2023-11-07 DIAGNOSIS — Z7901 Long term (current) use of anticoagulants: Secondary | ICD-10-CM | POA: Diagnosis not present

## 2023-11-07 DIAGNOSIS — I110 Hypertensive heart disease with heart failure: Secondary | ICD-10-CM | POA: Diagnosis not present

## 2023-11-07 DIAGNOSIS — E785 Hyperlipidemia, unspecified: Secondary | ICD-10-CM | POA: Diagnosis not present

## 2023-11-07 DIAGNOSIS — I480 Paroxysmal atrial fibrillation: Secondary | ICD-10-CM | POA: Diagnosis not present

## 2023-11-10 DIAGNOSIS — I480 Paroxysmal atrial fibrillation: Secondary | ICD-10-CM | POA: Diagnosis not present

## 2023-11-10 DIAGNOSIS — I5032 Chronic diastolic (congestive) heart failure: Secondary | ICD-10-CM | POA: Diagnosis not present

## 2023-11-10 DIAGNOSIS — Z4789 Encounter for other orthopedic aftercare: Secondary | ICD-10-CM | POA: Diagnosis not present

## 2023-11-10 DIAGNOSIS — I110 Hypertensive heart disease with heart failure: Secondary | ICD-10-CM | POA: Diagnosis not present

## 2023-11-10 DIAGNOSIS — E785 Hyperlipidemia, unspecified: Secondary | ICD-10-CM | POA: Diagnosis not present

## 2023-11-10 DIAGNOSIS — M19071 Primary osteoarthritis, right ankle and foot: Secondary | ICD-10-CM | POA: Diagnosis not present

## 2023-11-12 ENCOUNTER — Ambulatory Visit (INDEPENDENT_AMBULATORY_CARE_PROVIDER_SITE_OTHER)

## 2023-11-12 ENCOUNTER — Encounter: Payer: Self-pay | Admitting: Podiatry

## 2023-11-12 ENCOUNTER — Ambulatory Visit (INDEPENDENT_AMBULATORY_CARE_PROVIDER_SITE_OTHER): Payer: Medicare Other | Admitting: Podiatry

## 2023-11-12 VITALS — Ht 62.0 in | Wt 195.0 lb

## 2023-11-12 DIAGNOSIS — M7741 Metatarsalgia, right foot: Secondary | ICD-10-CM

## 2023-11-12 DIAGNOSIS — E7800 Pure hypercholesterolemia, unspecified: Secondary | ICD-10-CM | POA: Diagnosis not present

## 2023-11-12 DIAGNOSIS — I4891 Unspecified atrial fibrillation: Secondary | ICD-10-CM | POA: Diagnosis not present

## 2023-11-12 DIAGNOSIS — M2011 Hallux valgus (acquired), right foot: Secondary | ICD-10-CM | POA: Diagnosis not present

## 2023-11-12 DIAGNOSIS — I5032 Chronic diastolic (congestive) heart failure: Secondary | ICD-10-CM | POA: Diagnosis not present

## 2023-11-12 DIAGNOSIS — M2041 Other hammer toe(s) (acquired), right foot: Secondary | ICD-10-CM

## 2023-11-12 DIAGNOSIS — I10 Essential (primary) hypertension: Secondary | ICD-10-CM | POA: Diagnosis not present

## 2023-11-12 NOTE — Progress Notes (Signed)
  Subjective:  Patient ID: Dorothy Powers, female    DOB: Aug 31, 1945,  MRN: [id number]  Chief Complaint  Patient presents with   Routine Post Op    POV # 3 DOS 10/03/23 --- FUSION OF RIGHT GREAT TOE JOINTS, HAMMERTOE CORRECTION 2-5, METATARSAL HEAD 2-5, BONE GRAFT FROM HEEL OR CADAVER, BONE MARROW ASPIRATION FROM LEG     78 y.o. female returns for post-op check.   Review of Systems: Negative except as noted in the HPI. Denies N/V/F/Ch.   Objective:  There were no vitals filed for this visit. Body mass index is 35.67 kg/m. Constitutional Well developed. Well nourished.  Vascular Foot warm and well perfused. Capillary refill normal to all digits.  Calf is soft and supple, no posterior calf or knee pain, negative Homans' sign  Neurologic Normal speech. Oriented to person, place, and time. Epicritic sensation to light touch grossly present bilaterally.  Dermatologic Skin healing well without signs of infection. Skin edges well coapted without signs of infection.  Orthopedic: Tenderness to palpation noted about the surgical site.   Multiple view plain film radiographs: Good correction noted all hardware intact and in position, good consolidation across the fusion sites no complication of hardware Assessment:   1. Acquired hallux valgus of right foot   2. Hammertoe of right foot   3. Metatarsalgia, right foot    Plan:  Patient was evaluated and treated and all questions answered.  S/p foot surgery right - Appears to be healing well.  Pins removed uneventfully.  Compression sleeve and bandages applied.  Transition to shoe gear after next visit, surgical shoe dispensed today she may begin weightbearing as tolerated in this gradually.  Return in about 4 weeks (around 12/10/2023) for post op (new x-rays).

## 2023-11-16 ENCOUNTER — Other Ambulatory Visit: Payer: Self-pay | Admitting: Student

## 2023-11-16 ENCOUNTER — Other Ambulatory Visit: Payer: Self-pay | Admitting: Cardiology

## 2023-11-17 DIAGNOSIS — Z0000 Encounter for general adult medical examination without abnormal findings: Secondary | ICD-10-CM | POA: Diagnosis not present

## 2023-11-17 DIAGNOSIS — Z6835 Body mass index (BMI) 35.0-35.9, adult: Secondary | ICD-10-CM | POA: Diagnosis not present

## 2023-11-17 NOTE — Telephone Encounter (Signed)
 Prescription refill request for Eliquis  received. Indication:afib Last office visit:2/25 Scr:0.82  3/25 Age: 78 Weight:88.5  kg  Prescription refilled

## 2023-11-18 DIAGNOSIS — I110 Hypertensive heart disease with heart failure: Secondary | ICD-10-CM | POA: Diagnosis not present

## 2023-11-18 DIAGNOSIS — I480 Paroxysmal atrial fibrillation: Secondary | ICD-10-CM | POA: Diagnosis not present

## 2023-11-18 DIAGNOSIS — M19071 Primary osteoarthritis, right ankle and foot: Secondary | ICD-10-CM | POA: Diagnosis not present

## 2023-11-18 DIAGNOSIS — E785 Hyperlipidemia, unspecified: Secondary | ICD-10-CM | POA: Diagnosis not present

## 2023-11-18 DIAGNOSIS — I5032 Chronic diastolic (congestive) heart failure: Secondary | ICD-10-CM | POA: Diagnosis not present

## 2023-11-18 DIAGNOSIS — Z4789 Encounter for other orthopedic aftercare: Secondary | ICD-10-CM | POA: Diagnosis not present

## 2023-11-21 ENCOUNTER — Telehealth: Payer: Self-pay | Admitting: Podiatry

## 2023-11-21 NOTE — Telephone Encounter (Signed)
 Dorothy Powers from [facility] is sending orders to be faxed. Told her to fax to [facility].

## 2023-11-25 DIAGNOSIS — M19071 Primary osteoarthritis, right ankle and foot: Secondary | ICD-10-CM | POA: Diagnosis not present

## 2023-11-25 DIAGNOSIS — I5032 Chronic diastolic (congestive) heart failure: Secondary | ICD-10-CM | POA: Diagnosis not present

## 2023-11-25 DIAGNOSIS — E785 Hyperlipidemia, unspecified: Secondary | ICD-10-CM | POA: Diagnosis not present

## 2023-11-25 DIAGNOSIS — Z4789 Encounter for other orthopedic aftercare: Secondary | ICD-10-CM | POA: Diagnosis not present

## 2023-11-25 DIAGNOSIS — I110 Hypertensive heart disease with heart failure: Secondary | ICD-10-CM | POA: Diagnosis not present

## 2023-11-25 DIAGNOSIS — I480 Paroxysmal atrial fibrillation: Secondary | ICD-10-CM | POA: Diagnosis not present

## 2023-12-01 ENCOUNTER — Ambulatory Visit: Payer: Medicare Other | Admitting: Physician Assistant

## 2023-12-01 NOTE — Progress Notes (Signed)
  Cardiology Office Note:  .   Date:  12/01/2023  ID:  Dorothy Powers, DOB October 08, 1945, MRN [id number] PCP: Wyn Heater, MD  [facility] Providers Cardiologist:  Dorothye Gathers, MD Electrophysiologist:  Boyce Byes, MD {  History of Present Illness: Dorothy Powers   Dorothy Powers is a 78 y.o. female w/PMHx of  HTN, meningioma AFib, RBBB  She saw Dr. Marven Slimmer Nov 2024, had recently a bout of AFib lasting ~ 3days, also mentioned som increased BPs towards 140s, and concerns of some edema Planned for amio surveillance labs, no changes to BP meds, avoid salty foods  She saw Dr. Stann Earnest 09/10/23, consult perhaps for general cardiology pre-operative assessment. R foot pain limited ability to exercise. Planned to update her echo   Foot surgery 10/04/23   Today's visit is scheduled as a 6 mo visit  ROS:   Since her foot surgery she reports her diet unusually high in Na++ having to eat out or take in for many of their meals Sees ortho soon and hopefully will be able to get her restrictions lossened some  She denies any CP, palpitations or cardiac awareness No AFib by symptoms or her watch No SOB No near syncope or syncope No bleeding or signs of bleeding   Arrhythmia/AAD hx amiodarone   Studies Reviewed: Dorothy Powers    EKG not done today   09/19/23: TTE 1. Left ventricular ejection fraction, by estimation, is 60 to 65%. The  left ventricle has normal function. The left ventricle has no regional  wall motion abnormalities. Left ventricular diastolic parameters are  consistent with Grade II diastolic  dysfunction (pseudonormalization).   2. Right ventricular systolic function is normal. The right ventricular  size is normal.   3. Left atrial size was mild to moderately dilated.   4. Right atrial size was mildly dilated.   5. The mitral valve is normal in structure. Mild mitral valve  regurgitation. No evidence of mitral stenosis.   6. The aortic valve is tricuspid. There is mild  calcification of the  aortic valve. Aortic valve regurgitation is not visualized. No aortic  stenosis is present.   7. The inferior vena cava is normal in size with greater than 50%  respiratory variability, suggesting right atrial pressure of 3 mmHg.   Conclusion(s)/Recommendation(s): Pericardial effusion no longer present.     Risk Assessment/Calculations:    Physical Exam:   VS:  There were no vitals taken for this visit.   Wt Readings from Last 3 Encounters:  11/12/23 195 lb (88.5 kg)  10/03/23 194 lb 10.7 oz (88.3 kg)  09/25/23 194 lb 10.7 oz (88.3 kg)    GEN: Well nourished, well developed in no acute distress NECK: No JVD; No carotid bruits CARDIAC: RRR, no murmurs, rubs, gallops RESPIRATORY:   CTA b/l without rales, wheezing or rhonchi  ABDOMEN: Soft, non-tender, non-distended EXTREMITIES: R foot w/orthopedic boot,  No edema; No deformity   ASSESSMENT AND PLAN: .    persistent AFib CHA2DS2Vasc is 4, on Eliquis ,  appropriately dosed Low/no burden by symptoms Chronic amiodarone  Needs surveillance labs  HTN A recheck is 132/60  Secondary hypercoagulable state 2/2 AFib   Dispo: back in 73mo again, sooner if needed  Signed, Debbie Fails, PA-C

## 2023-12-02 ENCOUNTER — Ambulatory Visit: Attending: Physician Assistant | Admitting: Physician Assistant

## 2023-12-02 ENCOUNTER — Encounter: Payer: Self-pay | Admitting: Physician Assistant

## 2023-12-02 VITALS — BP 142/66 | HR 96 | Ht 62.0 in | Wt 194.6 lb

## 2023-12-02 DIAGNOSIS — I4819 Other persistent atrial fibrillation: Secondary | ICD-10-CM | POA: Diagnosis not present

## 2023-12-02 DIAGNOSIS — D6869 Other thrombophilia: Secondary | ICD-10-CM | POA: Insufficient documentation

## 2023-12-02 DIAGNOSIS — I10 Essential (primary) hypertension: Secondary | ICD-10-CM | POA: Insufficient documentation

## 2023-12-02 DIAGNOSIS — Z79899 Other long term (current) drug therapy: Secondary | ICD-10-CM | POA: Diagnosis not present

## 2023-12-02 DIAGNOSIS — I480 Paroxysmal atrial fibrillation: Secondary | ICD-10-CM | POA: Diagnosis not present

## 2023-12-02 NOTE — Patient Instructions (Signed)
 Medication Instructions:   Your physician recommends that you continue on your current medications as directed. Please refer to the Current Medication list given to you today.  *If you need a refill on your cardiac medications before your next appointment, please call your pharmacy*    Lab Work:   PLEASE GO DOWN STAIRS  [facility]  FIRST FLOOR   ( GET OFF ELEVATORS WALK TOWARDS WAITING AREA LAB LOCATED BY PHARMACY):  CMET AND TSH TODAY     If you have labs (blood work) drawn today and your tests are completely normal, you will receive your results only by: MyChart Message (if you have MyChart) OR A paper copy in the mail If you have any lab test that is abnormal or we need to change your treatment, we will call you to review the results.     Testing/Procedures: NONE ORDERED  TODAY      Follow-Up: At [facility], you and your health needs are our priority.  As part of our continuing mission to provide you with exceptional heart care, our providers are all part of one team.  This team includes your primary Cardiologist (physician) and Advanced Practice Providers or APPs (Physician Assistants and Nurse Practitioners) who all work together to provide you with the care you need, when you need it.   Your next appointment:    6 month(s)   Provider:    You may see Boyce Byes, MD  or one of the following Advanced Practice Providers on your designated Care Team:   Mertha Abrahams, [place]  We recommend signing up for the patient portal called "MyChart".  Sign up information is provided on this After Visit Summary.  MyChart is used to connect with patients for Virtual Visits (Telemedicine).  Patients are able to view lab/test results, encounter notes, upcoming appointments, etc.  Non-urgent messages can be sent to your provider as well.   To learn more about what you can do with MyChart, go to [email or web address].   Other Instructions:

## 2023-12-03 ENCOUNTER — Ambulatory Visit: Payer: Self-pay | Admitting: Physician Assistant

## 2023-12-03 LAB — COMPREHENSIVE METABOLIC PANEL WITH GFR
ALT: 21 IU/L (ref 0–32)
AST: 25 IU/L (ref 0–40)
Albumin: 4.2 g/dL (ref 3.8–4.8)
Alkaline Phosphatase: 150 IU/L — ABNORMAL HIGH (ref 44–121)
BUN/Creatinine Ratio: 21 (ref 12–28)
BUN: 17 mg/dL (ref 8–27)
Bilirubin Total: 0.5 mg/dL (ref 0.0–1.2)
CO2: 27 mmol/L (ref 20–29)
Calcium: 9.8 mg/dL (ref 8.7–10.3)
Chloride: 99 mmol/L (ref 96–106)
Creatinine, Ser: 0.8 mg/dL (ref 0.57–1.00)
Globulin, Total: 2.5 g/dL (ref 1.5–4.5)
Glucose: 91 mg/dL (ref 70–99)
Potassium: 4.2 mmol/L (ref 3.5–5.2)
Sodium: 141 mmol/L (ref 134–144)
Total Protein: 6.7 g/dL (ref 6.0–8.5)
eGFR: 74 mL/min/{1.73_m2} (ref >59)

## 2023-12-03 LAB — TSH: TSH: 5.88 u[IU]/mL — ABNORMAL HIGH (ref 0.450–4.500)

## 2023-12-05 DIAGNOSIS — E785 Hyperlipidemia, unspecified: Secondary | ICD-10-CM | POA: Diagnosis not present

## 2023-12-05 DIAGNOSIS — M19071 Primary osteoarthritis, right ankle and foot: Secondary | ICD-10-CM | POA: Diagnosis not present

## 2023-12-05 DIAGNOSIS — I110 Hypertensive heart disease with heart failure: Secondary | ICD-10-CM | POA: Diagnosis not present

## 2023-12-05 DIAGNOSIS — I480 Paroxysmal atrial fibrillation: Secondary | ICD-10-CM | POA: Diagnosis not present

## 2023-12-05 DIAGNOSIS — Z4789 Encounter for other orthopedic aftercare: Secondary | ICD-10-CM | POA: Diagnosis not present

## 2023-12-05 DIAGNOSIS — I5032 Chronic diastolic (congestive) heart failure: Secondary | ICD-10-CM | POA: Diagnosis not present

## 2023-12-10 ENCOUNTER — Ambulatory Visit (INDEPENDENT_AMBULATORY_CARE_PROVIDER_SITE_OTHER)

## 2023-12-10 ENCOUNTER — Ambulatory Visit (INDEPENDENT_AMBULATORY_CARE_PROVIDER_SITE_OTHER): Admitting: Podiatry

## 2023-12-10 ENCOUNTER — Other Ambulatory Visit: Payer: Self-pay | Admitting: *Deleted

## 2023-12-10 ENCOUNTER — Encounter: Payer: Self-pay | Admitting: Podiatry

## 2023-12-10 DIAGNOSIS — M2011 Hallux valgus (acquired), right foot: Secondary | ICD-10-CM

## 2023-12-10 DIAGNOSIS — Z79899 Other long term (current) drug therapy: Secondary | ICD-10-CM

## 2023-12-10 DIAGNOSIS — M2041 Other hammer toe(s) (acquired), right foot: Secondary | ICD-10-CM

## 2023-12-10 DIAGNOSIS — M7741 Metatarsalgia, right foot: Secondary | ICD-10-CM

## 2023-12-13 DIAGNOSIS — E7800 Pure hypercholesterolemia, unspecified: Secondary | ICD-10-CM | POA: Diagnosis not present

## 2023-12-13 DIAGNOSIS — I700 Atherosclerosis of aorta: Secondary | ICD-10-CM | POA: Diagnosis not present

## 2023-12-13 DIAGNOSIS — I5032 Chronic diastolic (congestive) heart failure: Secondary | ICD-10-CM | POA: Diagnosis not present

## 2023-12-13 DIAGNOSIS — I10 Essential (primary) hypertension: Secondary | ICD-10-CM | POA: Diagnosis not present

## 2023-12-13 DIAGNOSIS — I4891 Unspecified atrial fibrillation: Secondary | ICD-10-CM | POA: Diagnosis not present

## 2023-12-13 NOTE — Progress Notes (Signed)
  Subjective:  Patient ID: Dorothy Powers, female    DOB: Dec 08, 1945,  MRN: [id number]  Chief Complaint  Patient presents with   Routine Post Op    POV # 3 DOS 10/03/23 --- FUSION OF RIGHT GREAT TOE JOINTS, HAMMERTOE CORRECTION 2-5, METATARSAL HEAD 2-5, BONE GRAFT FROM HEEL OR CADAVER, BONE MARROW ASPIRATION FROM LEG "I think it's doing okay.  I get twinges from time to time.  It swells."     78 y.o. female returns for post-op check.   Review of Systems: Negative except as noted in the HPI. Denies N/V/F/Ch.   Objective:  There were no vitals filed for this visit. There is no height or weight on file to calculate BMI. Constitutional Well developed. Well nourished.  Vascular Foot warm and well perfused. Capillary refill normal to all digits.  Calf is soft and supple, no posterior calf or knee pain, negative Homans' sign  Neurologic Normal speech. Oriented to person, place, and time. Epicritic sensation to light touch grossly present bilaterally.  Dermatologic Incision is well-healed and not hypertrophic  Orthopedic: Minimal pain to palpation noted about the surgical site.  Mild edema   Multiple view plain film radiographs: Good correction noted all hardware intact and in position, good consolidation across the fusion sites no complication of hardware Assessment:   1. Acquired hallux valgus of right foot   2. Hammertoe of right foot   3. Metatarsalgia, right foot    Plan:  Patient was evaluated and treated and all questions answered.  S/p foot surgery right - Doing very well.  May return to regular shoe gear gradually.  Return in 2 months for final x-rays  Return in about 2 months (around 02/09/2024) for surgery follow up (new xrays).

## 2024-01-09 DIAGNOSIS — J069 Acute upper respiratory infection, unspecified: Secondary | ICD-10-CM | POA: Diagnosis not present

## 2024-01-12 DIAGNOSIS — I10 Essential (primary) hypertension: Secondary | ICD-10-CM | POA: Diagnosis not present

## 2024-01-12 DIAGNOSIS — I5032 Chronic diastolic (congestive) heart failure: Secondary | ICD-10-CM | POA: Diagnosis not present

## 2024-01-12 DIAGNOSIS — I4891 Unspecified atrial fibrillation: Secondary | ICD-10-CM | POA: Diagnosis not present

## 2024-01-12 DIAGNOSIS — E7800 Pure hypercholesterolemia, unspecified: Secondary | ICD-10-CM | POA: Diagnosis not present

## 2024-01-13 DIAGNOSIS — R052 Subacute cough: Secondary | ICD-10-CM | POA: Diagnosis not present

## 2024-01-13 DIAGNOSIS — R1310 Dysphagia, unspecified: Secondary | ICD-10-CM | POA: Diagnosis not present

## 2024-01-13 DIAGNOSIS — J310 Chronic rhinitis: Secondary | ICD-10-CM | POA: Diagnosis not present

## 2024-01-13 DIAGNOSIS — J019 Acute sinusitis, unspecified: Secondary | ICD-10-CM | POA: Diagnosis not present

## 2024-01-19 DIAGNOSIS — Z1231 Encounter for screening mammogram for malignant neoplasm of breast: Secondary | ICD-10-CM | POA: Diagnosis not present

## 2024-02-02 DIAGNOSIS — Z79899 Other long term (current) drug therapy: Secondary | ICD-10-CM | POA: Diagnosis not present

## 2024-02-03 ENCOUNTER — Ambulatory Visit: Payer: Self-pay | Admitting: Pulmonary Disease

## 2024-02-03 LAB — TSH: TSH: 7.99 u[IU]/mL — ABNORMAL HIGH (ref 0.450–4.500)

## 2024-02-04 ENCOUNTER — Other Ambulatory Visit: Payer: Self-pay | Admitting: *Deleted

## 2024-02-04 DIAGNOSIS — Z79899 Other long term (current) drug therapy: Secondary | ICD-10-CM

## 2024-02-05 DIAGNOSIS — Z79899 Other long term (current) drug therapy: Secondary | ICD-10-CM | POA: Diagnosis not present

## 2024-02-06 ENCOUNTER — Telehealth: Payer: Self-pay | Admitting: *Deleted

## 2024-02-06 ENCOUNTER — Ambulatory Visit: Payer: Self-pay | Admitting: Pulmonary Disease

## 2024-02-06 LAB — T4, FREE: Free T4: 1.24 ng/dL (ref 0.82–1.77)

## 2024-02-06 NOTE — Telephone Encounter (Signed)
 Called pt with lab results and she wanted me to send a message to [facility] to see if she felt that she should be seen due to her complaints of a trembling sensation in her chest and in her hand. The trembling happens more often in her chest than it does in her hand, but does not coincide. She also reports feeling more tired than usual. She can be reached at [phone]. Please advise.  Thanks, Clotilda Hafer

## 2024-02-06 NOTE — Telephone Encounter (Signed)
 Spoke with patient and provided her with message per [place]. Patient states that she did not have these complaints when she seen Renee. She reports that she doesn't feel like she will get much help with this but for now she will just wait it out and call us  or PCP if it continues and she feels like she needs to be seen.

## 2024-02-11 ENCOUNTER — Ambulatory Visit (INDEPENDENT_AMBULATORY_CARE_PROVIDER_SITE_OTHER)

## 2024-02-11 ENCOUNTER — Ambulatory Visit (INDEPENDENT_AMBULATORY_CARE_PROVIDER_SITE_OTHER): Admitting: Podiatry

## 2024-02-11 VITALS — Ht 62.0 in | Wt 194.6 lb

## 2024-02-11 DIAGNOSIS — M7741 Metatarsalgia, right foot: Secondary | ICD-10-CM | POA: Diagnosis not present

## 2024-02-11 DIAGNOSIS — M2041 Other hammer toe(s) (acquired), right foot: Secondary | ICD-10-CM

## 2024-02-11 DIAGNOSIS — M2011 Hallux valgus (acquired), right foot: Secondary | ICD-10-CM | POA: Diagnosis not present

## 2024-02-12 DIAGNOSIS — I10 Essential (primary) hypertension: Secondary | ICD-10-CM | POA: Diagnosis not present

## 2024-02-12 DIAGNOSIS — I4891 Unspecified atrial fibrillation: Secondary | ICD-10-CM | POA: Diagnosis not present

## 2024-02-12 DIAGNOSIS — E7800 Pure hypercholesterolemia, unspecified: Secondary | ICD-10-CM | POA: Diagnosis not present

## 2024-02-12 DIAGNOSIS — I5032 Chronic diastolic (congestive) heart failure: Secondary | ICD-10-CM | POA: Diagnosis not present

## 2024-02-12 NOTE — Progress Notes (Signed)
  Subjective:  Patient ID: Dorothy Powers, female    DOB: 10/18/1945,  MRN: [id number]  Chief Complaint  Patient presents with   Post-op Follow-up    RM 2 Patient is here for surgery follow up (new xrays DOS 10/03/2023. Patient states pain on the ball of the right foot  and on the lateral side close to the fifth toe( right foot).     78 y.o. female returns for post-op check.  Has noticed the second toe sitting up and pain under the ball of the foot again  Review of Systems: Negative except as noted in the HPI. Denies N/V/F/Ch.   Objective:  There were no vitals filed for this visit. Body mass index is 35.59 kg/m. Constitutional Well developed. Well nourished.  Vascular Foot warm and well perfused. Capillary refill normal to all digits.  Calf is soft and supple, no posterior calf or knee pain, negative Homans' sign  Neurologic Normal speech. Oriented to person, place, and time. Epicritic sensation to light touch grossly present bilaterally.  Dermatologic Incision is well-healed and not hypertrophic  Orthopedic: Dorsiflexion contracture of the second toe there is some prominence of the residual plantar second metatarsal   Multiple view plain film radiographs: Overall correction is maintained there is some dorsal displacement of the second toe on the metatarsal neck Assessment:   1. Acquired hallux valgus of right foot   2. Hammertoe of right foot   3. Metatarsalgia, right foot    Plan:  Patient was evaluated and treated and all questions answered.  S/p foot surgery right - Has had some displacement of the second MTP resection site.  Budin splint dispensed to work on plantarflexion of the toe.  Discussed possibility of revision if worsening or not improving.  Follow-up in 2 months to reevaluate with new radiographs  Return in about 2 months (around 04/13/2024) for surgery follow up (new xrays).

## 2024-02-18 ENCOUNTER — Other Ambulatory Visit: Payer: Self-pay | Admitting: Neurosurgery

## 2024-02-18 DIAGNOSIS — D496 Neoplasm of unspecified behavior of brain: Secondary | ICD-10-CM

## 2024-02-19 ENCOUNTER — Other Ambulatory Visit: Payer: Self-pay | Admitting: Internal Medicine

## 2024-02-24 ENCOUNTER — Ambulatory Visit
Admission: RE | Admit: 2024-02-24 | Discharge: 2024-02-24 | Disposition: A | Discharge status: Home / Self Care | Source: Ambulatory Visit | Attending: Neurosurgery | Admitting: Neurosurgery

## 2024-02-24 DIAGNOSIS — D496 Neoplasm of unspecified behavior of brain: Secondary | ICD-10-CM

## 2024-02-24 MED ORDER — GADOPICLENOL 0.5 MMOL/ML IV SOLN
9.0000 mL | Freq: Once | INTRAVENOUS | Status: AC | PRN
  Administered 2024-02-24 (×2): 9 mL via INTRAVENOUS

## 2024-03-03 DIAGNOSIS — D496 Neoplasm of unspecified behavior of brain: Secondary | ICD-10-CM | POA: Diagnosis not present

## 2024-03-14 DIAGNOSIS — E7800 Pure hypercholesterolemia, unspecified: Secondary | ICD-10-CM | POA: Diagnosis not present

## 2024-03-14 DIAGNOSIS — I10 Essential (primary) hypertension: Secondary | ICD-10-CM | POA: Diagnosis not present

## 2024-03-14 DIAGNOSIS — I4891 Unspecified atrial fibrillation: Secondary | ICD-10-CM | POA: Diagnosis not present

## 2024-03-14 DIAGNOSIS — I5032 Chronic diastolic (congestive) heart failure: Secondary | ICD-10-CM | POA: Diagnosis not present

## 2024-04-12 ENCOUNTER — Ambulatory Visit (INDEPENDENT_AMBULATORY_CARE_PROVIDER_SITE_OTHER): Admitting: Podiatry

## 2024-04-12 ENCOUNTER — Ambulatory Visit

## 2024-04-12 DIAGNOSIS — M7741 Metatarsalgia, right foot: Secondary | ICD-10-CM | POA: Diagnosis not present

## 2024-04-12 DIAGNOSIS — M2011 Hallux valgus (acquired), right foot: Secondary | ICD-10-CM

## 2024-04-13 DIAGNOSIS — I10 Essential (primary) hypertension: Secondary | ICD-10-CM | POA: Diagnosis not present

## 2024-04-13 DIAGNOSIS — I4891 Unspecified atrial fibrillation: Secondary | ICD-10-CM | POA: Diagnosis not present

## 2024-04-13 DIAGNOSIS — E7800 Pure hypercholesterolemia, unspecified: Secondary | ICD-10-CM | POA: Diagnosis not present

## 2024-04-13 DIAGNOSIS — I5032 Chronic diastolic (congestive) heart failure: Secondary | ICD-10-CM | POA: Diagnosis not present

## 2024-04-13 NOTE — Patient Instructions (Signed)
 The surgery we discussed would be removing more of the bone behind your second toe.  This would involve placing a new pin through the toe.  It would be in for about 5 weeks and you would be in the walking boot for this time.

## 2024-04-13 NOTE — Progress Notes (Signed)
  Subjective:  Patient ID: Dorothy Powers, female    DOB: 11-19-1945,  MRN: [id number]  No chief complaint on file.    78 y.o. female returns for post-op check.  Brace and splint has helped some keeping the toe down but still painful underneath the second toe Review of Systems: Negative except as noted in the HPI. Denies N/V/F/Ch.   Objective:  There were no vitals filed for this visit. There is no height or weight on file to calculate BMI. Constitutional Well developed. Well nourished.  Vascular Foot warm and well perfused. Capillary refill normal to all digits.  Calf is soft and supple, no posterior calf or knee pain, negative Homans' sign  Neurologic Normal speech. Oriented to person, place, and time. Epicritic sensation to light touch grossly present bilaterally.  Dermatologic Incision is well-healed and not hypertrophic  Orthopedic: Dorsiflexion contracture of the second toe there is some prominence of the residual plantar second metatarsal   Multiple view plain film radiographs: Minimal interval change in previous radiographs Assessment:   1. Metatarsalgia, right foot    Plan:  Patient was evaluated and treated and all questions answered.  S/p foot surgery right - Budin splint has only helped minimally.  We discussed further treatment options.  I discussed with her that revision metatarsal resection of the second metatarsal to remove additional bone and pin the toe again may offer some relief.  We discussed this would be a procedure that would require her to be in a cam boot weightbearing partially with a pin in the toe for at least 5-6 weeks.  She will consider options and let me know if she would like to proceed.  I dispensed her in a offloading aperture pad to use for now to offload pressure from the second metatarsal head.  No follow-ups on file.

## 2024-04-16 DIAGNOSIS — M1712 Unilateral primary osteoarthritis, left knee: Secondary | ICD-10-CM | POA: Diagnosis not present

## 2024-04-22 DIAGNOSIS — K219 Gastro-esophageal reflux disease without esophagitis: Secondary | ICD-10-CM | POA: Diagnosis not present

## 2024-04-22 DIAGNOSIS — E7800 Pure hypercholesterolemia, unspecified: Secondary | ICD-10-CM | POA: Diagnosis not present

## 2024-04-22 DIAGNOSIS — R262 Difficulty in walking, not elsewhere classified: Secondary | ICD-10-CM | POA: Diagnosis not present

## 2024-04-22 DIAGNOSIS — M179 Osteoarthritis of knee, unspecified: Secondary | ICD-10-CM | POA: Diagnosis not present

## 2024-04-22 DIAGNOSIS — I4891 Unspecified atrial fibrillation: Secondary | ICD-10-CM | POA: Diagnosis not present

## 2024-04-22 DIAGNOSIS — I5032 Chronic diastolic (congestive) heart failure: Secondary | ICD-10-CM | POA: Diagnosis not present

## 2024-04-22 DIAGNOSIS — I10 Essential (primary) hypertension: Secondary | ICD-10-CM | POA: Diagnosis not present

## 2024-04-22 DIAGNOSIS — D6869 Other thrombophilia: Secondary | ICD-10-CM | POA: Diagnosis not present

## 2024-05-04 ENCOUNTER — Other Ambulatory Visit: Payer: Self-pay

## 2024-05-12 ENCOUNTER — Ambulatory Visit: Admitting: Podiatry

## 2024-05-14 DIAGNOSIS — I4891 Unspecified atrial fibrillation: Secondary | ICD-10-CM | POA: Diagnosis not present

## 2024-05-14 DIAGNOSIS — I5032 Chronic diastolic (congestive) heart failure: Secondary | ICD-10-CM | POA: Diagnosis not present

## 2024-05-14 DIAGNOSIS — I10 Essential (primary) hypertension: Secondary | ICD-10-CM | POA: Diagnosis not present

## 2024-05-14 DIAGNOSIS — E7800 Pure hypercholesterolemia, unspecified: Secondary | ICD-10-CM | POA: Diagnosis not present

## 2024-05-21 ENCOUNTER — Other Ambulatory Visit: Payer: Self-pay | Admitting: Cardiology

## 2024-05-21 NOTE — Telephone Encounter (Signed)
 Prescription refill request for Eliquis  received. Indication: A. FIB Last office visit: 12/02/23 Scr: 0.80 (EPIC 12/02/23) Age: 78 Weight: 88.3 kg  Per protocol will send refill at current dose.

## 2024-05-28 DIAGNOSIS — Z23 Encounter for immunization: Secondary | ICD-10-CM | POA: Diagnosis not present

## 2024-06-01 DIAGNOSIS — M1712 Unilateral primary osteoarthritis, left knee: Secondary | ICD-10-CM | POA: Diagnosis not present

## 2024-06-07 DIAGNOSIS — Z961 Presence of intraocular lens: Secondary | ICD-10-CM | POA: Diagnosis not present

## 2024-06-07 DIAGNOSIS — H43811 Vitreous degeneration, right eye: Secondary | ICD-10-CM | POA: Diagnosis not present

## 2024-06-07 DIAGNOSIS — H1045 Other chronic allergic conjunctivitis: Secondary | ICD-10-CM | POA: Diagnosis not present

## 2024-06-07 DIAGNOSIS — H04123 Dry eye syndrome of bilateral lacrimal glands: Secondary | ICD-10-CM | POA: Diagnosis not present

## 2024-06-13 DIAGNOSIS — I5032 Chronic diastolic (congestive) heart failure: Secondary | ICD-10-CM | POA: Diagnosis not present

## 2024-06-13 DIAGNOSIS — I10 Essential (primary) hypertension: Secondary | ICD-10-CM | POA: Diagnosis not present

## 2024-06-13 DIAGNOSIS — I4891 Unspecified atrial fibrillation: Secondary | ICD-10-CM | POA: Diagnosis not present

## 2024-06-13 DIAGNOSIS — E7800 Pure hypercholesterolemia, unspecified: Secondary | ICD-10-CM | POA: Diagnosis not present

## 2024-06-22 NOTE — Progress Notes (Unsigned)
  Electrophysiology Office Follow up Visit Note:    Date:  06/22/2024   ID:  Dorothy Powers, DOB 01-01-1946, MRN [id number]  PCP:  Nanci Senior, MD  [facility] Cardiologist:  Oneil Parchment, MD  [facility] Electrophysiologist:  OLE ONEIDA HOLTS, MD    Interval History:     Dorothy Powers is a 78 y.o. female who presents for a follow up visit.   The patient Dorothy Powers Dec 02, 2023.  The patient has a history of hypertension, atrial fibrillation and right bundle branch block.  Also history of meningioma.  She is on amiodarone  for rhythm control.        Past medical, surgical, social and family history were reviewed.  ROS:   Please see the history of present illness.    All other systems reviewed and are negative.  EKGs/Labs/Other Studies Reviewed:    The following studies were reviewed today:          Physical Exam:    VS:  There were no vitals taken for this visit.    Wt Readings from Last 3 Encounters:  02/11/24 194 lb 9.6 oz (88.3 kg)  12/02/23 194 lb 9.6 oz (88.3 kg)  11/12/23 195 lb (88.5 kg)     GEN: no distress CARD: RRR, No MRG RESP: No IWOB. CTAB.      ASSESSMENT:    No diagnosis found. PLAN:    In order of problems listed above:  #Persistent atrial fibrillation #High risk medication monitoring-amiodarone  Maintaining sinus rhythm on amiodarone . Last blood work in May shows stable liver function.  TSH slightly elevated. Continue Eliquis  Continue amiodarone  200 mg by mouth once daily Repeat CMP, TSH and free T4 today***      Signed, Ole Holts, MD, [facility], [facility] 06/22/2024 8:28 AM    Electrophysiology [facility]

## 2024-06-24 ENCOUNTER — Encounter: Payer: Self-pay | Admitting: Cardiology

## 2024-06-24 ENCOUNTER — Ambulatory Visit: Attending: Internal Medicine | Admitting: Cardiology

## 2024-06-24 VITALS — BP 158/70 | HR 70 | Ht 62.0 in | Wt 200.0 lb

## 2024-06-24 DIAGNOSIS — Z79899 Other long term (current) drug therapy: Secondary | ICD-10-CM | POA: Diagnosis present

## 2024-06-24 DIAGNOSIS — I4819 Other persistent atrial fibrillation: Secondary | ICD-10-CM | POA: Diagnosis present

## 2024-06-24 DIAGNOSIS — I10 Essential (primary) hypertension: Secondary | ICD-10-CM | POA: Diagnosis present

## 2024-06-24 LAB — COMPREHENSIVE METABOLIC PANEL WITH GFR
ALT: 27 IU/L (ref 0–32)
AST: 28 IU/L (ref 0–40)
Albumin: 4.3 g/dL (ref 3.8–4.8)
Alkaline Phosphatase: 153 IU/L — ABNORMAL HIGH (ref 49–135)
BUN/Creatinine Ratio: 21 (ref 12–28)
BUN: 16 mg/dL (ref 8–27)
Bilirubin Total: 0.6 mg/dL (ref 0.0–1.2)
CO2: 28 mmol/L (ref 20–29)
Calcium: 9.8 mg/dL (ref 8.7–10.3)
Chloride: 100 mmol/L (ref 96–106)
Creatinine, Ser: 0.78 mg/dL (ref 0.57–1.00)
Globulin, Total: 2.1 g/dL (ref 1.5–4.5)
Glucose: 78 mg/dL (ref 70–99)
Potassium: 3.9 mmol/L (ref 3.5–5.2)
Sodium: 142 mmol/L (ref 134–144)
Total Protein: 6.4 g/dL (ref 6.0–8.5)
eGFR: 77 mL/min/1.73 (ref >59)

## 2024-06-24 LAB — TSH: TSH: 8.97 u[IU]/mL — ABNORMAL HIGH (ref 0.450–4.500)

## 2024-06-24 LAB — T4, FREE: Free T4: 1.05 ng/dL (ref 0.82–1.77)

## 2024-06-24 MED ORDER — DILTIAZEM HCL ER BEADS 300 MG PO CP24: 300.0000 mg | ORAL_CAPSULE | Freq: Every day | ORAL | 2 refills | Status: AC

## 2024-06-24 NOTE — Patient Instructions (Addendum)
 Medication Instructions:  Your physician recommends that you continue on your current medications as directed. Please refer to the Current Medication list given to you today.  *If you need a refill on your cardiac medications before your next appointment, please call your pharmacy*  Lab Work: Today: CMET, TSH & T4  If you have any lab test that is abnormal or we need to change your treatment, we will call you to review the results.  Testing/Procedures: None ordered  Follow-Up: At [facility], you and your health needs are our priority.  As part of our continuing mission to provide you with exceptional heart care, our providers are all part of one team.  This team includes your primary Cardiologist (physician) and Advanced Practice Providers or APPs (Physician Assistants and Nurse Practitioners) who all work together to provide you with the care you need, when you need it.  Your physician recommends that you schedule a follow-up appointment in: 6-8 weeks for pharmD/Hypertension clinic   Your next appointment:   6 month(s)  Provider:   You will see one of the following Advanced Practice Providers on your designated Care Team:   Charlies Arthur, PA-C Ozell Jodie Passey, PA-C Suzann Riddle, NP Daphne Barrack, NP Artist Pouch, PA-C      Thank you for choosing [facility]!!   [phone]   Other Instructions                              Blood Pressure Record Sheet To take your blood pressure, you will need a blood pressure machine. You can buy a blood pressure machine (blood pressure monitor) at your clinic, drug store, or online. When choosing one, consider: An automatic monitor that has an arm cuff. A cuff that wraps snugly around your upper arm. You should be able to fit only one finger between your arm and the cuff. A device that stores blood pressure reading results. Do not choose a monitor that measures your blood pressure from your wrist or  finger. Follow your health care provider's instructions for how to take your blood pressure. To use this form: Get one reading in the morning (a.m.) 1-2 hours after you take any medicines. Get one reading in the evening (p.m.) before supper.    Blood pressure log Date: _______________________   a.m. _____________________(1st reading) HR___________             p.m. _____________________(2nd reading) HR__________   Date: _______________________   a.m. _____________________(1st reading) HR___________             p.m. _____________________(2nd reading) HR__________   Date: _______________________   a.m. _____________________(1st reading) HR___________             p.m. _____________________(2nd reading) HR__________   Date: _______________________   a.m. _____________________(1st reading) HR___________             p.m. _____________________(2nd reading) HR__________   Date: _______________________   a.m. _____________________(1st reading) HR___________             p.m. _____________________(2nd reading) HR__________   Date: _______________________   a.m. _____________________(1st reading) HR___________             p.m. _____________________(2nd reading) HR__________   Date: _______________________   a.m. _____________________(1st reading) HR___________             p.m. _____________________(2nd reading) HR__________     This information is not intended to replace advice given to you by your health care  provider. Make sure you discuss any questions you have with your health care provider. Document Revised: 10/20/2019 Document Reviewed: 10/20/2019 Elsevier Patient Education  2021 [employer].

## 2024-06-27 ENCOUNTER — Ambulatory Visit: Payer: Self-pay | Admitting: Cardiology

## 2024-07-20 ENCOUNTER — Ambulatory Visit: Admitting: Podiatry

## 2024-07-20 ENCOUNTER — Encounter: Payer: Self-pay | Admitting: Podiatry

## 2024-07-20 DIAGNOSIS — L84 Corns and callosities: Secondary | ICD-10-CM

## 2024-07-20 DIAGNOSIS — B351 Tinea unguium: Secondary | ICD-10-CM

## 2024-07-20 DIAGNOSIS — D689 Coagulation defect, unspecified: Secondary | ICD-10-CM

## 2024-07-25 NOTE — Progress Notes (Signed)
"  °  Subjective:  Patient ID: Dorothy Powers Bud, female    DOB: 1945/08/16,  MRN: [id number]  Dorothy Powers Hilgers presents to clinic today for painful porokeratotic lesion(s) b/l feet and painful mycotic toenails that limit ambulation. Painful toenails interfere with ambulation. Aggravating factors include wearing enclosed shoe gear. Pain is relieved with periodic professional debridement. Painful porokeratotic lesions are aggravated when weightbearing with and without shoegear. Pain is relieved with periodic professional debridement.  Chief Complaint  Patient presents with   Nail Problem    This left big toenails is digging into my foot.  It looks like a nail is growing underneath that nail.  I'd like her to trim all my nails if she has time.   New problem(s): None.   PCP is Nanci Senior, MD.  Allergies[1]  Review of Systems: Negative except as noted in the HPI.  Objective: No changes noted in today's physical examination. There were no vitals filed for this visit. CARLEAH YABLONSKI is a pleasant 79 y.o. female WD, WN in NAD. AAO x 3.  Vascular Examination: Capillary refill time <3 seconds b/l LE. Palpable pedal pulses b/l LE. Digital hair present b/l. No pedal edema b/l. Skin temperature gradient WNL b/l. No varicosities b/l. No cyanosis or clubbing noted b/l LE.Dorothy Powers  Dermatological Examination: Pedal skin with normal turgor, texture and tone b/l. No open wounds. No interdigital macerations b/l. Toenails 1-5 b/l thickened, discolored, dystrophic with subungual debris. There is pain on palpation to dorsal aspect of nailplates.   Porokeratotic lesion(s) R 3rd toe, submet head 2 right foot, and submet head 5 b/l. No erythema, no edema, no drainage, no fluctuance..  Neurological Examination: Protective sensation intact with 10 gram monofilament b/l LE. Vibratory sensation intact b/l LE.   Musculoskeletal Examination: Normal muscle strength 5/5 to all lower extremity muscle groups  bilaterally. Hammertoe deformity noted 2-5 b/l.Dorothy Powers No pain, crepitus or joint limitation noted with ROM b/l LE.  Patient ambulates independently without assistive aids.  Assessment/Plan: 1. Pain due to onychomycosis of toenails of both feet   2. Corns   3. Coagulation defect   Patient was evaluated and treated. All patient's and/or POA's questions/concerns addressed on today's visit. Toenails 1-5 b/l debrided in length and girth without incident. Corn(s) and callus(es) right third digit, submet head 2 right foot, and submet head 5 b/l pared with sharp debridement without incident. Continue soft, supportive shoe gear daily. Report any pedal injuries to medical professional. Call office if there are any questions/concerns.  Return in about 3 months (around 10/18/2024).  [name], DPM      [place] LOCATION: [address], [place] [address]                   Office [phone]   [place] LOCATION: [address] [place], [place] [address] Office [phone]     [1]  Allergies Allergen Reactions   Codeine Other (See Comments)    Couldn't move one night for a short time, after taking the medicaiton   Lovastatin Other (See Comments)    Leg pain   Sulfa Antibiotics Other (See Comments)    Felt weak   "

## 2024-07-28 ENCOUNTER — Ambulatory Visit

## 2024-07-28 ENCOUNTER — Ambulatory Visit: Admitting: Podiatry

## 2024-07-28 VITALS — Ht 62.0 in | Wt 200.0 lb

## 2024-07-28 DIAGNOSIS — M2011 Hallux valgus (acquired), right foot: Secondary | ICD-10-CM

## 2024-07-28 DIAGNOSIS — M21961 Unspecified acquired deformity of right lower leg: Secondary | ICD-10-CM

## 2024-07-28 DIAGNOSIS — M2061 Acquired deformities of toe(s), unspecified, right foot: Secondary | ICD-10-CM | POA: Diagnosis not present

## 2024-07-28 NOTE — Patient Instructions (Signed)

## 2024-07-28 NOTE — Progress Notes (Signed)
"  °  Subjective:  Patient ID: Dorothy Powers, female    DOB: 09-15-1945,  MRN: [id number]  Chief Complaint  Patient presents with   Foot Problem    RM 3 Patient is here for a surgical consultation of the R 1st toe looked at curving in - discuss possible surgery.      79 y.o. female returns for post-op check.  She continues to have pain under the 2nd and 3rd toes and the ball of the foot, feels like the toes everted over as well   Review of Systems: Negative except as noted in the HPI. Denies N/V/F/Ch.   Objective:  There were no vitals filed for this visit. Body mass index is 36.58 kg/m. Constitutional Well developed. Well nourished.  Vascular Foot warm and well perfused. Capillary refill normal to all digits.  Calf is soft and supple, no posterior calf or knee pain, negative Homans' sign  Neurologic Normal speech. Oriented to person, place, and time. Epicritic sensation to light touch grossly present bilaterally.  Dermatologic Incision is well-healed and not hypertrophic  Orthopedic: Dorsiflexion contracture of the second toe there is some prominence of the residual plantar second metatarsal   Multiple view plain film radiographs: No change in first ray alignment, lesser toes have deviated medially Assessment:   1. Deformity of metatarsal bone of right foot   2. Deformity, toe acquired, right    Plan:  Patient was evaluated and treated and all questions answered.  S/p foot surgery right Has not improved much in regards to alignment or pain.  Fortunately does not have any deviation of the hallux or complication of the first ray that would require significant reconstruction.  The toes have deviated medially and slipped off the metatarsal alignment and have subluxed dorsally.  Discussed revision of the 2nd and 3rd metatarsal head resections with anchoring of the proximal phalanx to the residual metatarsal with a forefoot internal brace.  We discussed the risk benefits and  potential complications including risk of infection or nonhealing.  We also discussed the recovery process for the procedure including weightbearing as tolerated in a surgical shoe for approximately 5 to 6 weeks.  All questions addressed.  Informed CSI reviewed.  Surgery be scheduled mutually agreeable date.  No follow-ups on file.  "

## 2024-08-05 ENCOUNTER — Ambulatory Visit: Attending: Cardiology

## 2024-08-05 VITALS — BP 138/72 | HR 69

## 2024-08-05 DIAGNOSIS — I10 Essential (primary) hypertension: Secondary | ICD-10-CM | POA: Insufficient documentation

## 2024-08-05 MED ORDER — LOSARTAN POTASSIUM 25 MG PO TABS: 25.0000 mg | ORAL_TABLET | Freq: Every day | ORAL | 3 refills | Status: AC

## 2024-08-05 NOTE — Assessment & Plan Note (Addendum)
 Assessment: BP is 138/72 HR 57 in office, SBP slightly above the goal (<130/80). Home BP averaging 138/67 HR 57 No antihypertensives currently; She is taking diltiazem  300 mg daily for afib and furosemide  40 mg daily for HFpEF.  Denies SOB, palpitation, chest pain, headaches,or swelling Discussed antihypertensive options: Moa, ADRs, and monitoring Patient has tried lisinopril and hydrochlorothiazide in past without any side effects. Considering starting ARB given hx of chronic cough she's dealt with for years Most recent labs Scr: 0.78, Crcl 60 ml/min, K 3.9 - Will monitor Scr and K with addition of ARB as she is taking K supplements Reiterated the importance of regular exercise and low salt diet   Plan:  Start taking losartan  25 mg daily Patient to keep record of BP readings with heart rate and report to us  at the next visit Bring monitor to next visit for validation Patient to see PharmD in 4-5 weeks for follow up  Follow up lab(s): BMP in 2 weeks after starting losartan 

## 2024-08-05 NOTE — Progress Notes (Signed)
 Patient ID: Dorothy Powers                 DOB: 11-02-1945                      MRN: [id number]      HPI: Dorothy Powers is a 79 y.o. female referred by Dr. Cindie to HTN clinic. PMH is significant for HTN, HFpEF, atrial fibrillation, TIA, HLD and obesity.   Patient was recently seen by Dr. Cindie in December 2025 for follow up visit. Blood pressure was above goal at that visit (158/70). Provider recommended checking blood pressures 1-2 per week at home, bring recordings to PCP and follow up in PharmD HTN clinic.   Patient presents today in good spirits accompanied by her husband. She states that she has been on blood pressure medications in the past, recalling lisinopril. She reports experiencing no side effects to the medication and that a provider told her she didn't need blood pressure medications so it was stopped. Upon chart review, it appears hydrochlorothiazide and lisinopril were held due to borderline BP in 2023.  She presents with blood pressure log today, BP averaging 138/67 HR 57. Currently on no antihypertensives - She is taking diltiazem  300 mg daily for afib and furosemide  40 mg daily for HFpEF. She denies any SOB, headache, or chest pain. She is been feeling well overall, she does have a chronic cough that she's had for years. She has a new blood pressure cuff, unsure of brand and hasn't been validated before.   Current HTN meds: none; currently taking diltiazem  300 mg daily for afib and furosemide  40 mg daily for HFpEF Previously tried: lisinopril and hydrochlorothiazide - held due to borderline BP BP goal: < 130/80  Labs (06/2024): Scr: 0.78, Crcl 60 ml/min, K 3.9  Family History:   Relation Problem Comments  Mother (Deceased) Heart disease pacemaker    Father (Deceased) Heart attack      Social History:  Alcohol: Smoking:  Home BP readings: (BP readings over last couple weeks): 139/75 58 136/66 59 124/62 57 121/64 52 147/73 52 142/66 57 142/66  55 134/64 61 140/71 58 144/73 60 151/69 63 148/61 62 132/63 58 138/67 56  Average: 138/67 HR 57  Wt Readings from Last 3 Encounters:  07/28/24 200 lb (90.7 kg)  06/24/24 200 lb (90.7 kg)  02/11/24 194 lb 9.6 oz (88.3 kg)   BP Readings from Last 3 Encounters:  08/05/24 138/72  06/24/24 (!) 158/70  12/02/23 (!) 142/66   Pulse Readings from Last 3 Encounters:  08/05/24 69  06/24/24 70  12/02/23 96    Renal function: CrCl cannot be calculated (Patient's most recent lab result is older than the maximum 21 days allowed.).  Past Medical History:  Diagnosis Date   (HFpEF) heart failure with preserved ejection fraction (HCC)    Aortic atherosclerosis    CAP (community acquired pneumonia)    GERD (gastroesophageal reflux disease)    Hammertoe of right foot    Heart murmur    History of colonic polyps    Hyperlipidemia    Hypertension    Hypokalemia    Long term current use of amiodarone     Meningioma (HCC)    Morbid obesity (HCC)    OA (osteoarthritis)    On apixaban  therapy    Osteoarthritis    PAF (paroxysmal atrial fibrillation) (HCC)    a.) CHA2DS2VASc = 6 (age x 2, sex, HFpEF, HTN, vascular disease) as of  09/30/2023; b.) s/p DCCV  02/01/2022 --> 150 J x 1 --> NSR with PACs; c. rate/rhythm maintained on oral amiodarone  + diltiazem ; chronically anticoagulated with apixaban    RBBB (right bundle branch block)    Rosacea    Tachycardia    TIA (transient ischemic attack) 2021    Medications Ordered Prior to Encounter[1]  Allergies[2]  Blood pressure 138/72, pulse 69.   Assessment/Plan:  1. Hypertension -  Essential hypertension Assessment: BP is 138/72 HR 57 in office, SBP slightly above the goal (<130/80). Home BP averaging 138/67 HR 57 No antihypertensives currently; She is taking diltiazem  300 mg daily for afib and furosemide  40 mg daily for HFpEF.  Denies SOB, palpitation, chest pain, headaches,or swelling Discussed antihypertensive options: Moa, ADRs,  and monitoring Patient has tried lisinopril and hydrochlorothiazide in past without any side effects. Considering starting ARB given hx of chronic cough she's dealt with for years Most recent labs Scr: 0.78, Crcl 60 ml/min, K 3.9 - Will monitor Scr and K with addition of ARB as she is taking K supplements Reiterated the importance of regular exercise and low salt diet   Plan:  Start taking losartan  25 mg daily Patient to keep record of BP readings with heart rate and report to us  at the next visit Bring monitor to next visit for validation Patient to see PharmD in 4-5 weeks for follow up  Follow up lab(s): BMP in 2 weeks after starting losartan      Thank you  Read Bonelli E. Teige Rountree, Pharm.D [place] [facility]. [facility] [address], [place], [place] [address] Phone: [phone]; Fax: [phone]      [1]  Current Outpatient Medications on File Prior to Visit  Medication Sig Dispense Refill   amiodarone  (PACERONE ) 200 MG tablet Take 1 tablet by mouth once daily 90 tablet 3   apixaban  (ELIQUIS ) 5 MG TABS tablet Take 1 tablet by mouth twice daily 180 tablet 1   Ascorbic Acid (VITAMIN C PO) Take 500 mg by mouth in the morning.     Calcium  Carbonate-Vitamin D (CALCIUM -VITAMIN D) 600-3.125 MG-MCG TABS Take 2 tablets by mouth at bedtime.     cetirizine (ZYRTEC) 10 MG tablet Take 10 mg by mouth at bedtime.     Cholecalciferol (VITAMIN D3) 125 MCG (5000 UT) TABS Take 5,000 Units by mouth daily.     Coenzyme Q10 (CO Q 10 PO) Take 200 mg by mouth in the morning.     diltiazem  (TIAZAC ) 300 MG 24 hr capsule Take 1 capsule (300 mg total) by mouth daily. 90 capsule 2   diphenhydrAMINE  (BENADRYL ) 25 MG tablet Take 25-50 mg by mouth as needed for allergies.     doxycycline  (VIBRAMYCIN ) 50 MG capsule Take 50 mg by mouth in the morning. Rosacea (Patient taking differently: Take 50 mg by mouth 3 (three) times a week. Rosacea)     ezetimibe  (ZETIA ) 10 MG tablet  Take 10 mg by mouth every morning.     furosemide  (LASIX ) 40 MG tablet Take 1 tablet by mouth once daily 90 tablet 3   ipratropium (ATROVENT ) 0.03 % nasal spray Place 2 sprays into both nostrils 2 (two) times daily.     levothyroxine (SYNTHROID) 50 MCG tablet Take 50 mcg by mouth daily before breakfast.     pantoprazole  (PROTONIX ) 40 MG tablet Take 40 mg by mouth every morning.     potassium chloride  SA (KLOR-CON  M20) 20 MEQ tablet Take 1 tablet by mouth once  daily 90 tablet 3   rosuvastatin  (CRESTOR ) 10 MG tablet Take 10 mg by mouth every morning.     Misc Natural Products (OSTEO BI-FLEX ADV TRIPLE ST PO) Take 1 tablet by mouth in the morning and at bedtime.     Polyethyl Glycol-Propyl Glycol (SYSTANE OP) Place 2 drops into both eyes 2 (two) times daily.     No current facility-administered medications on file prior to visit.  [2]  Allergies Allergen Reactions   Codeine Other (See Comments)    Couldn't move one night for a short time, after taking the medicaiton   Lovastatin Other (See Comments)    Leg pain   Sulfa Antibiotics Other (See Comments)    Felt weak

## 2024-08-05 NOTE — Patient Instructions (Addendum)
 Changes made by your pharmacist Pamela Maddy E. Minerva Bluett, PharmD, CPP at today's visit:    Instructions/Changes  (what do you need to do) Your Notes  (what you did and when you did it)  Begin losartan  25 mg daily    2. Keep blood pressure log daily, 2 hours        after you take blood pressure meds   3. Obtain BMP lab 2 weeks after starting losartan    3. PharmD appointment 09/13/2024    Bring all of your meds, your BP cuff and your record of home blood pressures to your next appointment.   Garrit Marrow E. Kathlyne Loud, Pharm.D, CPP [place] [facility]. [facility] [address], [place], [place] [address] Phone: [phone]; Fax: [phone]     HOW TO TAKE YOUR BLOOD PRESSURE AT HOME  Rest 5 minutes before taking your blood pressure.  Dont smoke or drink caffeinated beverages for at least 30 minutes before. Take your blood pressure before (not after) you eat. Sit comfortably with your back supported and both feet on the floor (dont cross your legs). Elevate your arm to heart level on a table or a desk. Use the proper sized cuff. It should fit smoothly and snugly around your bare upper arm. There should be enough room to slip a fingertip under the cuff. The bottom edge of the cuff should be 1 inch above the crease of the elbow. Ideally, take 3 measurements at one sitting and record the average.  Important lifestyle changes to control high blood pressure  Intervention  Effect on the BP  Lose extra pounds and watch your waistline Weight loss is one of the most effective lifestyle changes for controlling blood pressure. If you're overweight or obese, losing even a small amount of weight can help reduce blood pressure. Blood pressure might go down by about 1 millimeter of mercury (mm Hg) with each kilogram (about 2.2 pounds) of weight lost.  Exercise regularly As a general goal, aim for at least 30 minutes of moderate physical activity every day. Regular physical  activity can lower high blood pressure by about 5 to 8 mm Hg.  Eat a healthy diet Eating a diet rich in whole grains, fruits, vegetables, and low-fat dairy products and low in saturated fat and cholesterol. A healthy diet can lower high blood pressure by up to 11 mm Hg.  Reduce salt (sodium) in your diet Even a small reduction of sodium in the diet can improve heart health and reduce high blood pressure by about 5 to 6 mm Hg.  Limit alcohol One drink equals 12 ounces of beer, 5 ounces of wine, or 1.5 ounces of 80-proof liquor.  Limiting alcohol to less than one drink a day for women or two drinks a day for men can help lower blood pressure by about 4 mm Hg.   If you have any questions or concerns please use My Chart to send questions or call the office at [phone]

## 2024-08-19 ENCOUNTER — Telehealth: Payer: Self-pay

## 2024-08-19 NOTE — Telephone Encounter (Signed)
 Called patient to remind her to obtain BMP since starting losartan  2 weeks ago. Patient stated that she plans to go next Monday. Will review labs once results are available.

## 2024-09-13 ENCOUNTER — Ambulatory Visit

## 2024-10-22 ENCOUNTER — Ambulatory Visit: Admitting: Podiatry
